# Patient Record
Sex: Male | Born: 1935 | Race: White | Hispanic: No | Marital: Single | Smoking: Former smoker
Health system: Southern US, Community
[De-identification: ages and names within clinical notes are randomized; demographics above are authoritative.]

## PROBLEM LIST (undated history)

## (undated) DIAGNOSIS — E063 Autoimmune thyroiditis: Secondary | ICD-10-CM

## (undated) DIAGNOSIS — D72829 Elevated white blood cell count, unspecified: Secondary | ICD-10-CM

## (undated) DIAGNOSIS — R195 Other fecal abnormalities: Secondary | ICD-10-CM

## (undated) DIAGNOSIS — J449 Chronic obstructive pulmonary disease, unspecified: Secondary | ICD-10-CM

## (undated) DIAGNOSIS — E119 Type 2 diabetes mellitus without complications: Secondary | ICD-10-CM

## (undated) DIAGNOSIS — R0781 Pleurodynia: Secondary | ICD-10-CM

## (undated) DIAGNOSIS — N400 Enlarged prostate without lower urinary tract symptoms: Secondary | ICD-10-CM

## (undated) DIAGNOSIS — K219 Gastro-esophageal reflux disease without esophagitis: Secondary | ICD-10-CM

## (undated) DIAGNOSIS — I4891 Unspecified atrial fibrillation: Secondary | ICD-10-CM

## (undated) DIAGNOSIS — Z87442 Personal history of urinary calculi: Secondary | ICD-10-CM

## (undated) DIAGNOSIS — I509 Heart failure, unspecified: Secondary | ICD-10-CM

## (undated) DIAGNOSIS — D649 Anemia, unspecified: Secondary | ICD-10-CM

## (undated) DIAGNOSIS — I2510 Atherosclerotic heart disease of native coronary artery without angina pectoris: Secondary | ICD-10-CM

## (undated) DIAGNOSIS — F32A Depression, unspecified: Secondary | ICD-10-CM

## (undated) DIAGNOSIS — C159 Malignant neoplasm of esophagus, unspecified: Secondary | ICD-10-CM

## (undated) DIAGNOSIS — R140 Abdominal distension (gaseous): Secondary | ICD-10-CM

## (undated) DIAGNOSIS — I639 Cerebral infarction, unspecified: Secondary | ICD-10-CM

## (undated) DIAGNOSIS — R51 Headache: Secondary | ICD-10-CM

## (undated) DIAGNOSIS — J42 Unspecified chronic bronchitis: Secondary | ICD-10-CM

## (undated) DIAGNOSIS — E079 Disorder of thyroid, unspecified: Secondary | ICD-10-CM

## (undated) DIAGNOSIS — J189 Pneumonia, unspecified organism: Secondary | ICD-10-CM

## (undated) DIAGNOSIS — R519 Headache, unspecified: Secondary | ICD-10-CM

## (undated) DIAGNOSIS — E785 Hyperlipidemia, unspecified: Secondary | ICD-10-CM

## (undated) DIAGNOSIS — I10 Essential (primary) hypertension: Secondary | ICD-10-CM

## (undated) DIAGNOSIS — K635 Polyp of colon: Secondary | ICD-10-CM

## (undated) DIAGNOSIS — R480 Dyslexia and alexia: Secondary | ICD-10-CM

## (undated) DIAGNOSIS — T7840XA Allergy, unspecified, initial encounter: Secondary | ICD-10-CM

## (undated) DIAGNOSIS — F329 Major depressive disorder, single episode, unspecified: Secondary | ICD-10-CM

## (undated) DIAGNOSIS — IMO0001 Reserved for inherently not codable concepts without codable children: Secondary | ICD-10-CM

## (undated) DIAGNOSIS — N189 Chronic kidney disease, unspecified: Secondary | ICD-10-CM

## (undated) HISTORY — PX: COLONOSCOPY: SHX174

## (undated) HISTORY — DX: Atherosclerotic heart disease of native coronary artery without angina pectoris: I25.10

## (undated) HISTORY — DX: Polyp of colon: K63.5

## (undated) HISTORY — PX: EYE SURGERY: SHX253

## (undated) HISTORY — DX: Personal history of urinary calculi: Z87.442

## (undated) HISTORY — DX: Disorder of thyroid, unspecified: E07.9

## (undated) HISTORY — PX: SPINE SURGERY: SHX786

## (undated) HISTORY — DX: Essential (primary) hypertension: I10

## (undated) HISTORY — DX: Heart failure, unspecified: I50.9

## (undated) HISTORY — DX: Hyperlipidemia, unspecified: E78.5

## (undated) HISTORY — DX: Depression, unspecified: F32.A

## (undated) HISTORY — DX: Unspecified atrial fibrillation: I48.91

## (undated) HISTORY — DX: Major depressive disorder, single episode, unspecified: F32.9

## (undated) HISTORY — DX: Type 2 diabetes mellitus without complications: E11.9

## (undated) HISTORY — DX: Gastro-esophageal reflux disease without esophagitis: K21.9

## (undated) HISTORY — DX: Unspecified chronic bronchitis: J42

## (undated) HISTORY — DX: Elevated white blood cell count, unspecified: D72.829

## (undated) HISTORY — DX: Pleurodynia: R07.81

## (undated) HISTORY — DX: Allergy, unspecified, initial encounter: T78.40XA

## (undated) HISTORY — DX: Autoimmune thyroiditis: E06.3

## (undated) HISTORY — PX: TONSILLECTOMY: SUR1361

## (undated) HISTORY — DX: Chronic obstructive pulmonary disease, unspecified: J44.9

## (undated) HISTORY — DX: Chronic kidney disease, unspecified: N18.9

## (undated) HISTORY — DX: Malignant neoplasm of esophagus, unspecified: C15.9

---

## 2002-02-24 ENCOUNTER — Ambulatory Visit (HOSPITAL_COMMUNITY): Admission: RE | Admit: 2002-02-24 | Discharge: 2002-02-24 | Payer: Self-pay | Admitting: Gastroenterology

## 2003-11-29 ENCOUNTER — Ambulatory Visit (HOSPITAL_COMMUNITY): Admission: RE | Admit: 2003-11-29 | Discharge: 2003-11-29 | Payer: Self-pay | Admitting: Gastroenterology

## 2004-04-07 HISTORY — PX: ESOPHAGUS SURGERY: SHX626

## 2004-06-12 ENCOUNTER — Ambulatory Visit: Payer: Self-pay | Admitting: Internal Medicine

## 2004-06-19 ENCOUNTER — Ambulatory Visit: Payer: Self-pay | Admitting: Internal Medicine

## 2004-09-04 ENCOUNTER — Ambulatory Visit: Payer: Self-pay | Admitting: Internal Medicine

## 2004-10-09 ENCOUNTER — Ambulatory Visit (HOSPITAL_COMMUNITY): Admission: RE | Admit: 2004-10-09 | Discharge: 2004-10-09 | Payer: Self-pay | Admitting: Gastroenterology

## 2004-10-15 ENCOUNTER — Ambulatory Visit (HOSPITAL_COMMUNITY): Admission: RE | Admit: 2004-10-15 | Discharge: 2004-10-15 | Payer: Self-pay | Admitting: Gastroenterology

## 2004-10-16 ENCOUNTER — Ambulatory Visit: Payer: Self-pay | Admitting: Internal Medicine

## 2004-11-01 ENCOUNTER — Ambulatory Visit (HOSPITAL_COMMUNITY): Admission: RE | Admit: 2004-11-01 | Discharge: 2004-11-01 | Payer: Self-pay | Admitting: Gastroenterology

## 2004-11-06 ENCOUNTER — Ambulatory Visit (HOSPITAL_COMMUNITY): Admission: RE | Admit: 2004-11-06 | Discharge: 2004-11-06 | Payer: Self-pay | Admitting: Gastroenterology

## 2004-11-08 ENCOUNTER — Ambulatory Visit (HOSPITAL_COMMUNITY): Admission: RE | Admit: 2004-11-08 | Discharge: 2004-11-08 | Payer: Self-pay | Admitting: Gastroenterology

## 2004-11-15 ENCOUNTER — Ambulatory Visit: Payer: Self-pay | Admitting: Internal Medicine

## 2005-03-11 ENCOUNTER — Ambulatory Visit: Payer: Self-pay | Admitting: Internal Medicine

## 2005-03-14 ENCOUNTER — Ambulatory Visit: Payer: Self-pay | Admitting: Internal Medicine

## 2005-07-09 ENCOUNTER — Ambulatory Visit: Payer: Self-pay | Admitting: Internal Medicine

## 2005-09-12 ENCOUNTER — Ambulatory Visit: Payer: Self-pay | Admitting: Internal Medicine

## 2005-10-02 ENCOUNTER — Ambulatory Visit (HOSPITAL_BASED_OUTPATIENT_CLINIC_OR_DEPARTMENT_OTHER): Admission: RE | Admit: 2005-10-02 | Discharge: 2005-10-02 | Payer: Self-pay | Admitting: Internal Medicine

## 2005-10-05 ENCOUNTER — Ambulatory Visit: Payer: Self-pay | Admitting: Internal Medicine

## 2005-10-21 ENCOUNTER — Ambulatory Visit: Payer: Self-pay | Admitting: Internal Medicine

## 2005-11-10 ENCOUNTER — Ambulatory Visit: Payer: Self-pay | Admitting: Internal Medicine

## 2005-12-02 ENCOUNTER — Ambulatory Visit: Payer: Self-pay | Admitting: Internal Medicine

## 2006-03-23 ENCOUNTER — Ambulatory Visit: Payer: Self-pay | Admitting: Internal Medicine

## 2006-04-02 ENCOUNTER — Ambulatory Visit: Payer: Self-pay | Admitting: Internal Medicine

## 2006-07-15 ENCOUNTER — Ambulatory Visit: Payer: Self-pay | Admitting: Internal Medicine

## 2007-04-08 HISTORY — PX: CARDIAC CATHETERIZATION: SHX172

## 2013-07-05 LAB — PULMONARY FUNCTION TEST

## 2014-08-11 ENCOUNTER — Telehealth: Payer: Self-pay | Admitting: *Deleted

## 2014-08-11 ENCOUNTER — Encounter: Payer: Self-pay | Admitting: *Deleted

## 2014-08-11 NOTE — Telephone Encounter (Signed)
Pre-Visit Call completed with patient and chart updated.   Pre-Visit Info documented in Specialty Comments under [employer].    

## 2014-08-14 ENCOUNTER — Encounter: Payer: Self-pay | Admitting: Physician Assistant

## 2014-08-14 ENCOUNTER — Ambulatory Visit (INDEPENDENT_AMBULATORY_CARE_PROVIDER_SITE_OTHER): Payer: Medicare Other | Admitting: Physician Assistant

## 2014-08-14 VITALS — BP 130/60 | HR 86 | Temp 98.0°F | Ht 71.0 in | Wt 219.0 lb

## 2014-08-14 DIAGNOSIS — Z8679 Personal history of other diseases of the circulatory system: Secondary | ICD-10-CM

## 2014-08-14 DIAGNOSIS — I209 Angina pectoris, unspecified: Secondary | ICD-10-CM | POA: Diagnosis not present

## 2014-08-14 DIAGNOSIS — J449 Chronic obstructive pulmonary disease, unspecified: Secondary | ICD-10-CM | POA: Diagnosis not present

## 2014-08-14 DIAGNOSIS — E039 Hypothyroidism, unspecified: Secondary | ICD-10-CM

## 2014-08-14 DIAGNOSIS — F331 Major depressive disorder, recurrent, moderate: Secondary | ICD-10-CM

## 2014-08-14 DIAGNOSIS — E119 Type 2 diabetes mellitus without complications: Secondary | ICD-10-CM | POA: Diagnosis not present

## 2014-08-14 MED ORDER — NYSTATIN 100000 UNIT/GM EX OINT: 1.0000 "application " | TOPICAL_OINTMENT | Freq: Two times a day (BID) | CUTANEOUS | Status: DC

## 2014-08-14 MED ORDER — DULOXETINE HCL 60 MG PO CPEP
60.0000 mg | ORAL_CAPSULE | Freq: Every day | ORAL | Status: AC
Start: 2014-08-14 — End: ?

## 2014-08-14 NOTE — Progress Notes (Signed)
Pre visit review using our clinic review tool, if applicable. No additional management support is needed unless otherwise documented below in the visit note. 

## 2014-08-14 NOTE — Progress Notes (Signed)
Patient presents to clinic today to establish care.  Acute Concerns: No acute concerns at today's visit. Is in need of referral to specialists after moving from [place].   Chronic Issues: COPD -- Previously followed by Pulmonology. New referral needed. Patient states he recently got over an exacerbation.  Denies SOB, fever, chest pain or productive cough at present. Is taking medications as directed.    CHF/Atrial Fibrillation -- Previously followed by Cardiology. New referral needed per patient.  Endorses well controlled with multi-drug regimen. Patient denies chest pain, palpitations, lightheadedness, dizziness, vision changes or frequent headaches.  Denies swelling, PND or orthopnea at present.  Is on Eliquis for anticoagulation.  Is on Ranexa for chronic angina.  Hypothyroidism -- Endorses well controlled with levothyroxine daily. Needs referral to Endo for Hypothyroidism and DM II.  Diabetes Melllitus -- Endorses well controlled with Lantus 60 mg nightly and Novolog following his sliding scale.  Endorses good urinary output.  Has some mild neuropathy controlled with gabapentin.  Denies histtry of retinopathy. CBGs averaging 110-140 presently.  Is requesting Endo referral.  Major Depressive Disorder -- chronic.  Well controlled with Cymbalta and Wellbutrin.  Denies SI/HI.  Denies anxiety.  Wishes to see counseling services here. Needs refill of Cymbalta.  Past Medical History  Diagnosis Date  . COPD (chronic obstructive pulmonary disease)   . CHF (congestive heart failure)   . Thyroid disease   . Chronic bronchitis   . Atrial fibrillation   . CKD (chronic kidney disease)   . Diabetes mellitus without complication   . Allergy   . Cancer     esophogeal   . CKD (chronic kidney disease)   . Depression     Past Surgical History  Procedure Laterality Date  . Esophagus surgery  2006  . Spine surgery      bone spurs  . Cardiac catheterization  2009    two stents placed  .  Tonsillectomy      Current Outpatient Prescriptions on File Prior to Visit  Medication Sig Dispense Refill  . amiodarone (PACERONE) 200 MG tablet Take 200 mg by mouth 2 (two) times daily.    Marland Kitchen amoxicillin-clavulanate (AUGMENTIN) 875-125 MG per tablet Take 1 tablet by mouth 2 (two) times daily.    Marland Kitchen apixaban (ELIQUIS) 5 MG TABS tablet Take 5 mg by mouth 2 (two) times daily.    Marland Kitchen atorvastatin (LIPITOR) 20 MG tablet Take 20 mg by mouth daily.    Marland Kitchen buPROPion (WELLBUTRIN XL) 300 MG 24 hr tablet Take 300 mg by mouth daily.    Marland Kitchen diltiazem (DILACOR XR) 240 MG 24 hr capsule Take 240 mg by mouth daily.    Marland Kitchen doxercalciferol (HECTOROL) 2.5 MCG capsule Take 2.5 mcg by mouth daily.    . Ergocalciferol (VITAMIN D2 PO) Take 50,000 Units by mouth every 30 (thirty) days.    . Fluticasone Furoate-Vilanterol (BREO ELLIPTA IN) Inhale 1 puff into the lungs.    . furosemide (LASIX) 40 MG tablet Take 40-80 mg by mouth daily.    Marland Kitchen gabapentin (NEURONTIN) 300 MG capsule Take 600 mg by mouth 6 (six) times daily.    . insulin aspart (NOVOLOG) 100 UNIT/ML injection Inject 8-28 Units into the skin 3 (three) times daily before meals.    . insulin glargine (LANTUS) 100 UNIT/ML injection Inject 60 Units into the skin at bedtime.    Marland Kitchen ipratropium-albuterol (DUONEB) 0.5-2.5 (3) MG/3ML SOLN Take 3 mLs by nebulization.    Marland Kitchen levothyroxine (SYNTHROID, LEVOTHROID) 112 MCG tablet  Take 112 mcg by mouth daily before breakfast.    . montelukast (SINGULAIR) 10 MG tablet Take 10 mg by mouth at bedtime.    . nebivolol (BYSTOLIC) 10 MG tablet Take 10 mg by mouth daily.    Marland Kitchen olopatadine (PATANOL) 0.1 % ophthalmic solution Place 1 drop into the left eye 2 (two) times daily.    . ranolazine (RANEXA) 500 MG 12 hr tablet Take 500 mg by mouth 2 (two) times daily.    . roflumilast (DALIRESP) 500 MCG TABS tablet Take 500 mcg by mouth daily.    Marland Kitchen spironolactone (ALDACTONE) 25 MG tablet Take 25 mg by mouth daily.    Marland Kitchen tiotropium (SPIRIVA) 18 MCG  inhalation capsule Place 18 mcg into inhaler and inhale daily.     No current facility-administered medications on file prior to visit.    Allergies  Allergen Reactions  . Dust Mite Extract Other (See Comments)    Runny nose   . Pollen Extract Other (See Comments)    Runny nose    No family history on file.  History   Social History  . Marital Status: Single    Spouse Name: N/A  . Number of Children: N/A  . Years of Education: N/A   Occupational History  . Not on file.   Social History Main Topics  . Smoking status: Former Research scientist (life sciences)  . Smokeless tobacco: Not on file  . Alcohol Use: No  . Drug Use: Not on file  . Sexual Activity: Not on file   Other Topics Concern  . Not on file   Social History Narrative   Review of Systems  Constitutional: Negative for fever and malaise/fatigue.  Eyes: Negative for blurred vision and double vision.  Respiratory: Negative for cough and shortness of breath.   Cardiovascular: Negative for chest pain and palpitations.  Genitourinary: Negative for dysuria, urgency, frequency, hematuria and flank pain.  Musculoskeletal: Negative for myalgias.  Neurological: Negative for dizziness and loss of consciousness.  Endo/Heme/Allergies: Negative for environmental allergies.  Psychiatric/Behavioral: Positive for depression. Negative for suicidal ideas, hallucinations and substance abuse. The patient is not nervous/anxious and does not have insomnia.    BP 130/60 mmHg  Pulse 86  Temp(Src) 98 F (36.7 C)  Ht 5\' 11"  (1.803 m)  Wt 219 lb (99.338 kg)  BMI 30.56 kg/m2  SpO2 97%  Physical Exam  Constitutional: He is oriented to person, place, and time and well-developed, well-nourished, and in no distress.  HENT:  Head: Normocephalic and atraumatic.  Eyes: Conjunctivae are normal.  Cardiovascular: Normal rate, regular rhythm, normal heart sounds and intact distal pulses.   Pulmonary/Chest: Effort normal and breath sounds normal. No respiratory  distress. He has no wheezes. He has no rales. He exhibits no tenderness.  Neurological: He is alert and oriented to person, place, and time.  Skin: Skin is warm and dry. No rash noted.  Psychiatric: Affect normal.  Vitals reviewed.  Assessment/Plan: Thyroid activity decreased Referral placed to Endocrinology per patient request.  Asymptomatic at present.  Continue current regimen.   Major depressive disorder, recurrent episode, moderate Will take over medication management.  Patient stable.  Will continue Cymbalta and Wellbutrin. Handout given on [facility] counselors.  Patient will call to schedule appointment with Karna Christmas or Almyra Free.   History of coronary artery disease Asymptomatic presently. Referral placed to Cardiology for follow-up.  Continue current medication regimen.   Diabetes mellitus type II, controlled CBGs acceptable.  Referral placed to Endocrinology per patient request.  Continue current insulin  regimen.  Minimize carbs.   COLD (chronic obstructive lung disease) Stable.  Referral placed to Pulmonology for follow-up and repeat PFTS.

## 2014-08-14 NOTE — Patient Instructions (Signed)
I have placed your referrals. You will be contacted for appointments. If at any time you are running low on medications before you see specialists, please call me so I can take care of it for you.  Please call the number given so you can set up care with our counselors.  I will take over your depression medications.  I would like to see you in 3 months.

## 2014-08-15 DIAGNOSIS — F331 Major depressive disorder, recurrent, moderate: Secondary | ICD-10-CM | POA: Insufficient documentation

## 2014-08-15 DIAGNOSIS — E063 Autoimmune thyroiditis: Secondary | ICD-10-CM | POA: Insufficient documentation

## 2014-08-15 DIAGNOSIS — E119 Type 2 diabetes mellitus without complications: Secondary | ICD-10-CM | POA: Insufficient documentation

## 2014-08-15 DIAGNOSIS — J449 Chronic obstructive pulmonary disease, unspecified: Secondary | ICD-10-CM | POA: Insufficient documentation

## 2014-08-15 DIAGNOSIS — Z8679 Personal history of other diseases of the circulatory system: Secondary | ICD-10-CM | POA: Insufficient documentation

## 2014-08-15 DIAGNOSIS — I209 Angina pectoris, unspecified: Secondary | ICD-10-CM | POA: Insufficient documentation

## 2014-08-15 NOTE — Assessment & Plan Note (Signed)
Asymptomatic presently. Referral placed to Cardiology for follow-up.  Continue current medication regimen.

## 2014-08-15 NOTE — Assessment & Plan Note (Signed)
Will take over medication management.  Patient stable.  Will continue Cymbalta and Wellbutrin. Handout given on [facility] counselors.  Patient will call to schedule appointment with Karna Christmas or Almyra Free.

## 2014-08-15 NOTE — Assessment & Plan Note (Signed)
Stable.  Referral placed to Pulmonology for follow-up and repeat PFTS.

## 2014-08-15 NOTE — Assessment & Plan Note (Signed)
CBGs acceptable.  Referral placed to Endocrinology per patient request.  Continue current insulin regimen.  Minimize carbs.

## 2014-08-15 NOTE — Assessment & Plan Note (Signed)
Referral placed to Endocrinology per patient request.  Asymptomatic at present.  Continue current regimen.

## 2014-08-16 ENCOUNTER — Ambulatory Visit (INDEPENDENT_AMBULATORY_CARE_PROVIDER_SITE_OTHER): Payer: Medicare Other | Admitting: Cardiology

## 2014-08-16 ENCOUNTER — Encounter: Payer: Self-pay | Admitting: Cardiology

## 2014-08-16 VITALS — BP 120/60 | HR 64 | Ht 71.0 in | Wt 221.0 lb

## 2014-08-16 DIAGNOSIS — R011 Cardiac murmur, unspecified: Secondary | ICD-10-CM | POA: Diagnosis not present

## 2014-08-16 DIAGNOSIS — I209 Angina pectoris, unspecified: Secondary | ICD-10-CM

## 2014-08-16 DIAGNOSIS — E785 Hyperlipidemia, unspecified: Secondary | ICD-10-CM

## 2014-08-16 DIAGNOSIS — I480 Paroxysmal atrial fibrillation: Secondary | ICD-10-CM

## 2014-08-16 DIAGNOSIS — I10 Essential (primary) hypertension: Secondary | ICD-10-CM | POA: Diagnosis not present

## 2014-08-16 DIAGNOSIS — Z8679 Personal history of other diseases of the circulatory system: Secondary | ICD-10-CM | POA: Diagnosis not present

## 2014-08-16 NOTE — Patient Instructions (Signed)
Medication Instructions:   Your physician recommends that you continue on your current medications as directed. Please refer to the Current Medication list given to you today.   Labwork:  SCHEDULE A LAB APPOINTMENT PRIOR TO YOUR 6 MONTH FOLLOW-UP APPOINTMENT WITH DR NELSON TO CHECK---LIPIDS, CMET, TSH, AND HEMOGLOBIN A1C----PLEASE COME FASTING TO THIS APPOINTMENT.     Testing/Procedures:  Your physician has requested that you have an echocardiogram. Echocardiography is a painless test that uses sound waves to create images of your heart. It provides your doctor with information about the size and shape of your heart and how well your heart's chambers and valves are working. This procedure takes approximately one hour. There are no restrictions for this procedure.    Follow-Up:  Your physician wants you to follow-up in: [place] will receive a reminder letter in the mail two months in advance. If you don't receive a letter, please call our office to schedule the follow-up appointment.

## 2014-08-16 NOTE — Progress Notes (Signed)
Patient ID: Ricky Baker, male   DOB: July 12, 1935, 79 y.o.   MRN: [id number]      Cardiology Office Note  Date:  08/16/2014   ID:  Ricky Baker, DOB [birth year], MRN [id number]  PCP:  Leeanne Rio, PA-C  Cardiologist:   Dorothy Spark, MD   Chief complain: DOE, establishing care   History of Present Illness:  Ricky Baker is a 79 y.o. male with prior medical history of coronary artery disease status post placement of 2 stents in 2013, COPD on home 2 at 2 liters, congestive heart failure, hypertension, IDDM, hyperlipidemia who has just moved to [place] couple weeks ago. The patient was followed in [facility] by Dr. Lyndel Safe. He states that prior to his stent placement he experience worsening dyspnea on exertion however never experienced chest pain. He noticed mild improvement of dyspnea after stent stent placement as he is on home O2 oxygen and has dyspnea almost all the time. The patient is also experiencing diabetic neuropathy and pain in his legs. He also has history of paroxysmal atrial fibrillation currently on Eliquis 5 mg orally twice a day. He has history of esophageal cancer status post partial esophagectomy, but no radiation. He is very compliant with his medications. He takes 40-80 mg of Lasix daily based on his weight as well as as well as spironolactone. His palpitations or syncope. No orthopnea or proximal nocturnal dyspnea.  Past Medical History  Diagnosis Date  . COPD (chronic obstructive pulmonary disease)   . CHF (congestive heart failure)   . Thyroid disease   . Chronic bronchitis   . Atrial fibrillation   . CKD (chronic kidney disease)   . Diabetes mellitus without complication   . Allergy   . Cancer     esophogeal   . CKD (chronic kidney disease)   . Depression    Past Surgical History  Procedure Laterality Date  . Esophagus surgery  2006  . Spine surgery      bone spurs  . Cardiac catheterization  2009    two stents placed  .  Tonsillectomy     Current Outpatient Prescriptions  Medication Sig Dispense Refill  . amiodarone (PACERONE) 200 MG tablet Take 200 mg by mouth 2 (two) times daily.    Marland Kitchen amoxicillin-clavulanate (AUGMENTIN) 875-125 MG per tablet Take 1 tablet by mouth 2 (two) times daily.    Marland Kitchen apixaban (ELIQUIS) 5 MG TABS tablet Take 5 mg by mouth 2 (two) times daily.    Marland Kitchen atorvastatin (LIPITOR) 20 MG tablet Take 20 mg by mouth daily.    Marland Kitchen buPROPion (WELLBUTRIN XL) 300 MG 24 hr tablet Take 300 mg by mouth daily.    Marland Kitchen diltiazem (DILACOR XR) 240 MG 24 hr capsule Take 240 mg by mouth daily.    Marland Kitchen doxercalciferol (HECTOROL) 2.5 MCG capsule Take 2.5 mcg by mouth daily.    . DULoxetine (CYMBALTA) 60 MG capsule Take 1 capsule (60 mg total) by mouth daily. 90 capsule 1  . Ergocalciferol (VITAMIN D2 PO) Take 50,000 Units by mouth every 30 (thirty) days.    . Fluticasone Furoate-Vilanterol (BREO ELLIPTA IN) Inhale 1 puff into the lungs.    . furosemide (LASIX) 40 MG tablet Take 40-80 mg by mouth daily.    Marland Kitchen gabapentin (NEURONTIN) 300 MG capsule Take 600 mg by mouth 6 (six) times daily.    . insulin aspart (NOVOLOG) 100 UNIT/ML injection Inject 8-28 Units into the skin 3 (three) times daily before meals.    Marland Kitchen  insulin glargine (LANTUS) 100 UNIT/ML injection Inject 60 Units into the skin at bedtime.    Marland Kitchen ipratropium-albuterol (DUONEB) 0.5-2.5 (3) MG/3ML SOLN Take 3 mLs by nebulization.    Marland Kitchen levothyroxine (SYNTHROID, LEVOTHROID) 112 MCG tablet Take 112 mcg by mouth daily before breakfast.    . montelukast (SINGULAIR) 10 MG tablet Take 10 mg by mouth at bedtime.    . nebivolol (BYSTOLIC) 10 MG tablet Take 10 mg by mouth daily.    Marland Kitchen nystatin ointment (MYCOSTATIN) Apply 1 application topically 2 (two) times daily. 30 g 0  . olopatadine (PATANOL) 0.1 % ophthalmic solution Place 1 drop into the left eye 2 (two) times daily as needed.     . ranolazine (RANEXA) 500 MG 12 hr tablet Take 500 mg by mouth 2 (two) times daily.    .  roflumilast (DALIRESP) 500 MCG TABS tablet Take 500 mcg by mouth daily.    Marland Kitchen spironolactone (ALDACTONE) 25 MG tablet Take 25 mg by mouth daily.    Marland Kitchen tiotropium (SPIRIVA) 18 MCG inhalation capsule Place 18 mcg into inhaler and inhale daily.     No current facility-administered medications for this visit.   Allergies:   Dust mite extract and Pollen extract   Social History:  The patient  reports that he has quit smoking. He does not have any smokeless tobacco history on file. He reports that he does not drink alcohol.   Family History:  No FH of CAD or CHF.    ROS:  Please see the history of present illness.   Otherwise, review of systems are positive for none.   All other systems are reviewed and negative.    PHYSICAL EXAM: VS:  BP 120/60 mmHg  Pulse 64  Ht 5\' 11"  (1.803 m)  Wt 221 lb (100.245 kg)  BMI 30.84 kg/m2 , BMI Body mass index is 30.84 kg/(m^2). GEN: Well nourished, well developed, in no acute distress HEENT: normal Neck: no JVD, carotid bruits, or masses Cardiac: RRR; 3/6 holosystolic murmur, rubs, or gallops, no edema, 1+ PD, peripheral pulses Respiratory:  clear to auscultation bilaterally, normal work of breathing GI: soft, nontender, nondistended, + BS MS: no deformity or atrophy Skin: warm and dry, no rash Neuro:  Strength and sensation are intact Psych: euthymic mood, full affect   EKG:  EKG is ordered today. The ekg ordered today demonstrates SR, 1.AVB, otherwise normal  Recent Labs: No results found for requested labs within last 365 days.    Lipid Panel No results found for: CHOL, TRIG, HDL, CHOLHDL, VLDL, LDLCALC, LDLDIRECT    Wt Readings from Last 3 Encounters:  08/16/14 221 lb (100.245 kg)  08/14/14 219 lb (99.338 kg)      ASSESSMENT AND PLAN:  1. CAD, s/p 2 stents placement - stable DOE, ECG shows no acute changes, no ischemic workup at this point  2. CHF - unknown LVEF and diastolic function, we will order echocardiogram, the patient is  euvolemic, we will continue the same regimen of lasix 40-80 mg po daily and spironolactone 25 mg po daily.   3. Systolic murmur - we will order echocardiogram  4. Hypertension - controlled, continue the same regimen  5. HLP - continue atorvastatin 20 mg po daily, we will check labs at the next visit.    6. Parox a-fib - on apixaban, we will continue, currently in SR on amiodarone, diltiazem, nebivolol.   Current medicines are reviewed at length with the patient today.  The patient does not have concerns regarding  medicines.  The following changes have been made:  no change  Labs/ tests ordered today include: TTE   Disposition:   FU with /me in 6 month with CMP, lipids, TSH, HbA1c.  Signed, Dorothy Spark, MD  08/16/2014 2:52 PM    [facility] [place], [place], [place]  [address] Phone: [phone]; Fax: [phone]

## 2014-08-21 ENCOUNTER — Telehealth: Payer: Self-pay | Admitting: Physician Assistant

## 2014-08-21 NOTE — Telephone Encounter (Signed)
Caller: Silvano Garofano, self Ph#: [phone]  Pt states his sister had a msg on her answering machine with appt 5/18 at 8:30am with psychology. They left contact ph# of [phone]. I informed pt I do not see referral in for psychology or appt thru our system. Please f/u with pt and notify him if a referral was sent for him.

## 2014-08-21 NOTE — Telephone Encounter (Signed)
Informed the patient of PCP instructions.

## 2014-08-21 NOTE — Telephone Encounter (Signed)
Patient was given a handout on our counseling services with a number to call and set up own appointment.  We do not do referrals to psychology due to [place]. He would be the one that set up appointment.

## 2014-08-24 ENCOUNTER — Other Ambulatory Visit: Payer: Self-pay

## 2014-08-24 ENCOUNTER — Ambulatory Visit (HOSPITAL_COMMUNITY): Payer: Medicare Other | Attending: Cardiology

## 2014-08-24 DIAGNOSIS — F329 Major depressive disorder, single episode, unspecified: Secondary | ICD-10-CM | POA: Diagnosis not present

## 2014-08-24 DIAGNOSIS — I10 Essential (primary) hypertension: Secondary | ICD-10-CM | POA: Insufficient documentation

## 2014-08-24 DIAGNOSIS — Z87891 Personal history of nicotine dependence: Secondary | ICD-10-CM | POA: Insufficient documentation

## 2014-08-24 DIAGNOSIS — E119 Type 2 diabetes mellitus without complications: Secondary | ICD-10-CM | POA: Insufficient documentation

## 2014-08-24 DIAGNOSIS — E785 Hyperlipidemia, unspecified: Secondary | ICD-10-CM

## 2014-08-24 DIAGNOSIS — R011 Cardiac murmur, unspecified: Secondary | ICD-10-CM | POA: Diagnosis not present

## 2014-08-24 DIAGNOSIS — R209 Unspecified disturbances of skin sensation: Secondary | ICD-10-CM | POA: Insufficient documentation

## 2014-08-24 DIAGNOSIS — I480 Paroxysmal atrial fibrillation: Secondary | ICD-10-CM

## 2014-08-24 DIAGNOSIS — Z8679 Personal history of other diseases of the circulatory system: Secondary | ICD-10-CM | POA: Diagnosis not present

## 2014-08-24 DIAGNOSIS — Z794 Long term (current) use of insulin: Secondary | ICD-10-CM | POA: Insufficient documentation

## 2014-08-24 DIAGNOSIS — I209 Angina pectoris, unspecified: Secondary | ICD-10-CM | POA: Diagnosis not present

## 2014-08-24 MED ORDER — PERFLUTREN LIPID MICROSPHERE
2.0000 mL | Freq: Once | INTRAVENOUS | Status: AC
  Administered 2014-08-24: 2 mL via INTRAVENOUS

## 2014-08-25 ENCOUNTER — Other Ambulatory Visit: Payer: Self-pay | Admitting: *Deleted

## 2014-08-25 DIAGNOSIS — I350 Nonrheumatic aortic (valve) stenosis: Secondary | ICD-10-CM

## 2014-08-30 ENCOUNTER — Encounter: Payer: Self-pay | Admitting: Endocrinology

## 2014-08-30 ENCOUNTER — Ambulatory Visit (INDEPENDENT_AMBULATORY_CARE_PROVIDER_SITE_OTHER): Payer: Medicare Other | Admitting: Endocrinology

## 2014-08-30 VITALS — BP 134/70 | HR 68 | Temp 97.8°F | Resp 16 | Ht 71.0 in | Wt 221.4 lb

## 2014-08-30 DIAGNOSIS — N189 Chronic kidney disease, unspecified: Secondary | ICD-10-CM

## 2014-08-30 DIAGNOSIS — E038 Other specified hypothyroidism: Secondary | ICD-10-CM | POA: Diagnosis not present

## 2014-08-30 DIAGNOSIS — E1165 Type 2 diabetes mellitus with hyperglycemia: Secondary | ICD-10-CM | POA: Diagnosis not present

## 2014-08-30 DIAGNOSIS — G629 Polyneuropathy, unspecified: Secondary | ICD-10-CM

## 2014-08-30 DIAGNOSIS — E291 Testicular hypofunction: Secondary | ICD-10-CM

## 2014-08-30 DIAGNOSIS — E1142 Type 2 diabetes mellitus with diabetic polyneuropathy: Secondary | ICD-10-CM | POA: Diagnosis not present

## 2014-08-30 DIAGNOSIS — IMO0002 Reserved for concepts with insufficient information to code with codable children: Secondary | ICD-10-CM

## 2014-08-30 DIAGNOSIS — E063 Autoimmune thyroiditis: Secondary | ICD-10-CM

## 2014-08-30 LAB — CBC WITH DIFFERENTIAL/PLATELET
BASOS PCT: 0.6 % (ref 0.0–3.0)
Basophils Absolute: 0 10*3/uL (ref 0.0–0.1)
EOS PCT: 3.4 % (ref 0.0–5.0)
Eosinophils Absolute: 0.3 10*3/uL (ref 0.0–0.7)
HCT: 34 % — ABNORMAL LOW (ref 39.0–52.0)
Hemoglobin: 11.8 g/dL — ABNORMAL LOW (ref 13.0–17.0)
LYMPHS ABS: 1.5 10*3/uL (ref 0.7–4.0)
LYMPHS PCT: 20.1 % (ref 12.0–46.0)
MCHC: 34.6 g/dL (ref 30.0–36.0)
MCV: 90.2 fl (ref 78.0–100.0)
Monocytes Absolute: 0.5 10*3/uL (ref 0.1–1.0)
Monocytes Relative: 5.9 % (ref 3.0–12.0)
Neutro Abs: 5.4 10*3/uL (ref 1.4–7.7)
Neutrophils Relative %: 70 % (ref 43.0–77.0)
Platelets: 276 10*3/uL (ref 150.0–400.0)
RBC: 3.77 Mil/uL — ABNORMAL LOW (ref 4.22–5.81)
RDW: 13 % (ref 11.5–15.5)
WBC: 7.7 10*3/uL (ref 4.0–10.5)

## 2014-08-30 LAB — LIPID PANEL
Cholesterol: 217 mg/dL — ABNORMAL HIGH (ref 0–200)
HDL: 37.1 mg/dL — ABNORMAL LOW (ref >39.00)
Total CHOL/HDL Ratio: 6
Triglycerides: 412 mg/dL — ABNORMAL HIGH (ref 0.0–149.0)

## 2014-08-30 LAB — COMPREHENSIVE METABOLIC PANEL
ALT: 10 U/L (ref 0–53)
AST: 13 U/L (ref 0–37)
Albumin: 3.6 g/dL (ref 3.5–5.2)
Alkaline Phosphatase: 146 U/L — ABNORMAL HIGH (ref 39–117)
BILIRUBIN TOTAL: 0.3 mg/dL (ref 0.2–1.2)
BUN: 28 mg/dL — ABNORMAL HIGH (ref 6–23)
CO2: 21 meq/L (ref 19–32)
Calcium: 8.9 mg/dL (ref 8.4–10.5)
Chloride: 111 mEq/L (ref 96–112)
Creatinine, Ser: 2.19 mg/dL — ABNORMAL HIGH (ref 0.40–1.50)
GFR: 31.21 mL/min — ABNORMAL LOW (ref >60.00)
Glucose, Bld: 242 mg/dL — ABNORMAL HIGH (ref 70–99)
Potassium: 4 mEq/L (ref 3.5–5.1)
Sodium: 140 mEq/L (ref 135–145)
Total Protein: 6.2 g/dL (ref 6.0–8.3)

## 2014-08-30 LAB — LDL CHOLESTEROL, DIRECT: Direct LDL: 131 mg/dL

## 2014-08-30 LAB — HEMOGLOBIN A1C: Hgb A1c MFr Bld: 9.2 % — ABNORMAL HIGH (ref 4.6–6.5)

## 2014-08-30 LAB — T4, FREE: FREE T4: 1.02 ng/dL (ref 0.60–1.60)

## 2014-08-30 LAB — TSH: TSH: 1.78 u[IU]/mL (ref 0.35–4.50)

## 2014-08-30 NOTE — Patient Instructions (Addendum)
Check blood sugars on waking up .Marland Kitchen 4-5 .Marland Kitchen times a week Also check blood sugars about 2 hours after a meal and do this after different meals by rotation  Recommended blood sugar levels on waking up is 90-130 and about 2 hours after meal is 140-180 Please bring blood sugar monitor to each visit.  NOVOLOG meal time coverage  10 UNITS AT BREAKFAST, 16 at lunch and 10 at supper Take this regardless of sugar before the meal  FOR HIGH SUGARS add NOVOLOG as below:  Sugar 150-199, take extra 2 units; 200-249 take 4 more and over 250 take 6 more

## 2014-08-30 NOTE — Progress Notes (Signed)
Quick Note:  Please let patient know that the A1c is high at 9.2 Cholesterol is excessively high. Confirm that he is taking his 20 mg atorvastatin daily, need to change this to 80 mg daily Thyroid okay His labs will be forwarded to PCP ______

## 2014-08-30 NOTE — Progress Notes (Signed)
Patient ID: Ricky Baker, male   DOB: [birth year], 79 y.o.   MRN: [id number]           Reason for Appointment: Consultation for Type 2 Diabetes  Referring physician: Raiford Noble  History of Present Illness:          Date of diagnosis of type 2 diabetes mellitus :        Background history:  The patient was on oral medications for the first few years of his diagnosis, not clear what control he had at that time After about 10 years because of his problem with esophageal cancer and not being able to eat normal food he was on insulin Subsequently this was continued, partly because of his kidney function not being normal and metformin being discontinued He thinks he has been on Lantus insulin for several years along with Novolog Details of his previous control is not available but apparently it had been dependent on his compliance  Recent history:  He says for the last year he has been quite irregular with his insulin regimen and has not been motivated to take care of his diabetes His last A1c was over 8% about 2 months ago but no records are available. He does not take his Novolog frequently and occasionally will forget his Lantus also  Insulin regimen: He is taking Novolog only on a sliding scale and since he is not checking his blood sugars consistently at meal times he will not take this consistently.  Takes Lantus at night but may take variable doses based on blood sugar at night  INSULIN regimen is described as: Novolog insulin for blood sugars over 130: 8-28 units before meals.  Lantus 60 units at bedtime        Current blood sugar patterns and problems identified:    He is checking his blood sugars very sporadically and appears to be doing them frequently during the night and less during the day  for the lastHe appears to have had low normal or low sugars occasionally with taking extra insulin for high readings and not eating as much  He has only a couple of readings in the  morning fasting and these appear to be consistently high  Overall blood sugars are very erratic but overall the highest when he checks them overnight, once glucose was 524  He will forget his Lantus periodically at night and this may cause a sugars to be higher overnight  He thinks that if he does not have high sugar and he takes 60 units of Lantus he will get a low sugar the next morning  Most of a sugars at bedtime are over 300 now  His overall average blood sugar for the last month his 268 Hypoglycemia:   only his glucose was  50 about 2 hours after evening meal  Oral hypoglycemic drugs the patient is taking are:   none     Compliance with the medical regimen: Poor  Glucose monitoring:  done 1 times a day on an average         Glucometer:  Accu-Chek Blood Glucose readings by time of day and averages from meter download:  PREMEAL Breakfast Lunch Dinner Bedtime  overnight   Glucose range:  186-330   250-300  322-408   125-524   Median:     292   POST-MEAL PC Breakfast PC Lunch PC Dinner  Glucose range: ?    70   50   Median:  Self-care: The diet that the patient has been following is: None.  Eating out at restaurants about 4-5 times a week, usually at fast food places Meal times: Breakfast: 9 AM Lunch: 1 pm Dinner: 6pm  Typical meal intake: Breakfast is cereal              Dietician visit, most recent: A few years ago               Exercise:  none, unable to do much activity because of his multiple medical problems and balance issues   Weight history: He has gradually lost about 40 pounds over the last year and a half   Wt Readings from Last 3 Encounters:  08/30/14 221 lb 6.4 oz (100.426 kg)  08/16/14 221 lb (100.245 kg)  08/14/14 219 lb (99.338 kg)    Glycemic control: Last A1c was 8% or more in March   No results found for: HGBA1C No results found for: GLUF, [place], [place], CREATININE       Medication List       This list is accurate as of: 08/30/14  12:07 PM.  Always use your most recent med list.               amiodarone 200 MG tablet  Commonly known as:  PACERONE  Take 200 mg by mouth 2 (two) times daily.     amoxicillin-clavulanate 875-125 MG per tablet  Commonly known as:  AUGMENTIN  Take 1 tablet by mouth 2 (two) times daily.     apixaban 5 MG Tabs tablet  Commonly known as:  ELIQUIS  Take 5 mg by mouth 2 (two) times daily.     atorvastatin 20 MG tablet  Commonly known as:  LIPITOR  Take 20 mg by mouth daily.     BREO ELLIPTA IN  Inhale 1 puff into the lungs.     buPROPion 300 MG 24 hr tablet  Commonly known as:  WELLBUTRIN XL  Take 300 mg by mouth daily.     diltiazem 240 MG 24 hr capsule  Commonly known as:  DILACOR XR  Take 240 mg by mouth daily.     doxercalciferol 2.5 MCG capsule  Commonly known as:  HECTOROL  Take 2.5 mcg by mouth daily.     DULoxetine 60 MG capsule  Commonly known as:  CYMBALTA  Take 1 capsule (60 mg total) by mouth daily.     furosemide 40 MG tablet  Commonly known as:  LASIX  Take 40-80 mg by mouth daily.     gabapentin 300 MG capsule  Commonly known as:  NEURONTIN  Take 600 mg by mouth 6 (six) times daily.     insulin aspart 100 UNIT/ML injection  Commonly known as:  novoLOG  Inject 8-28 Units into the skin 3 (three) times daily before meals.     insulin glargine 100 UNIT/ML injection  Commonly known as:  LANTUS  Inject 60 Units into the skin at bedtime.     ipratropium-albuterol 0.5-2.5 (3) MG/3ML Soln  Commonly known as:  DUONEB  Take 3 mLs by nebulization.     levothyroxine 112 MCG tablet  Commonly known as:  SYNTHROID, LEVOTHROID  Take 112 mcg by mouth daily before breakfast.     montelukast 10 MG tablet  Commonly known as:  SINGULAIR  Take 10 mg by mouth at bedtime.     nebivolol 10 MG tablet  Commonly known as:  BYSTOLIC  Take 10 mg by mouth daily.  nystatin ointment  Commonly known as:  MYCOSTATIN  Apply 1 application topically 2 (two) times  daily.     olopatadine 0.1 % ophthalmic solution  Commonly known as:  PATANOL  Place 1 drop into the left eye 2 (two) times daily as needed.     ranolazine 500 MG 12 hr tablet  Commonly known as:  RANEXA  Take 500 mg by mouth 2 (two) times daily.     roflumilast 500 MCG Tabs tablet  Commonly known as:  DALIRESP  Take 500 mcg by mouth daily.     spironolactone 25 MG tablet  Commonly known as:  ALDACTONE  Take 25 mg by mouth daily.     testosterone 50 MG/5GM (1%) Gel  Commonly known as:  ANDROGEL  Place 5 g onto the skin daily. Uses 6 pumps daily     tiotropium 18 MCG inhalation capsule  Commonly known as:  SPIRIVA  Place 18 mcg into inhaler and inhale daily.     VITAMIN D2 PO  Take 50,000 Units by mouth every 30 (thirty) days.        Allergies:  Allergies  Allergen Reactions  . Dust Mite Extract Other (See Comments)    Runny nose   . Pollen Extract Other (See Comments)    Runny nose    Past Medical History  Diagnosis Date  . COPD (chronic obstructive pulmonary disease)   . CHF (congestive heart failure)   . Thyroid disease   . Chronic bronchitis   . Atrial fibrillation   . CKD (chronic kidney disease)   . Diabetes mellitus without complication   . Allergy   . Cancer     esophogeal   . CKD (chronic kidney disease)   . Depression   . Hypertension     Past Surgical History  Procedure Laterality Date  . Esophagus surgery  2006  . Spine surgery      bone spurs  . Cardiac catheterization  2009    two stents placed  . Tonsillectomy      Family History  Problem Relation Age of Onset  . COPD Father   . Diabetes Neg Hx     Social History:  reports that he has quit smoking. He does not have any smokeless tobacco history on file. He reports that he does not drink alcohol. His drug history is not on file.    Review of Systems    Lipid history: On Lipitor for long time    No results found for: CHOL, HDL, LDLCALC, LDLDIRECT, TRIG, CHOLHDL          Constitutional: no recent weight gain/loss.  Does complain of fatigue  Eyes: no history of blurred vision.  Most recent eye exam was 5/16 and reportedly normal, he saw an optometrist in [place]  ENT: no nasal congestion, difficulty swallowing  Cardiovascular: no chest pain or tightness on exertion.  No recent leg swelling.  Has been on Lasix for history of CHF History of atrial fibrillation, currently taking amiodarone Hypertension: Present for several years and treated with multiple drugs including diltiazem, Bystolic and Aldactone  Respiratory: no cough but does have significant shortness of breath from his chronic COPD.  Uses oxygen at home along with multiple inhalers  Gastrointestinal: no constipation, diarrhea, nausea or abdominal pain.  He will get severe reflux if he lies down flat  Musculoskeletal: no muscle/joint aches   Urological:   No frequency of urination or excessive Nocturia ED: Has been symptomatic for several years, previously tried  Cialis and Viagra with no benefit.  Currently not sexually active and has decreased libido  Skin: no rash or infections  Neurological: no headaches.  Has long-standing numbness and pains or tingling in feet controlled with gabapentin   Psychiatric:  Some symptoms of depression  Endocrine: No unusual fatigue, cold intolerance  He has a history of thyroid disease for about 52 yrs and taking thyroid supplements, previously followed by endocrinologist.  He has been on the same dose for about 2 years  Hypogonadism: He was treated by a urologist for about 6 years, previously was told to have hypogonadism when he was complaining of feeling lethargic and decreased libido.  Has not taken any AndroGel for about a year now.  May be feeling a little tired now   Anemia: He thinks his last hemoglobin was about 11  CKD: Previously followed by nephrologist  No results found for: TSH, FREET4     No results found for any previous  visit.  Physical Examination:  BP 134/70 mmHg  Pulse 68  Temp(Src) 97.8 F (36.6 C)  Resp 16  Ht 5\' 11"  (1.803 m)  Wt 221 lb 6.4 oz (100.426 kg)  BMI 30.89 kg/m2  SpO2 93%  GENERAL:         Patient has mild generalized obesity, does have abdominal obesity also .   HEENT:         Eye exam shows normal external appearance. Fundus exam shows no retinopathy. Oral exam shows normal mucosa, tongue is relatively dry and coated .  NECK:   There is no lymphadenopathy Thyroid is not enlarged and no nodules felt.  Carotids are normal to palpation and no bruit heard LUNGS:         Chest is symmetrical. Lungs are clear to auscultation.Marland Kitchen   HEART:         Heart sounds:  S1 and S2 are relatively distant. No S3 or S4.  Has 3/6 soft pansystolic murmur over the left precordium    ABDOMEN:   There is no distention present. Liver and spleen are not palpable. No other mass or tenderness present.   NEUROLOGICAL:   Vibration sense is  absent in distal first toes. Ankle and biceps jerks are absent bilaterally.          Pedal pulses absent, monofilament sensation absent except mild sensation present in the left distal toes MUSCULOSKELETAL:  There is no swelling or deformity of the peripheral joints. Spine is normal to inspection.   EXTREMITIES:     There is no edema. No skin lesions present. SKIN:       No rash or lesions of concern.        ASSESSMENT:  Diabetes type 2, uncontrolled   See history of present illness for detailed discussion of his current management, blood sugar patterns and problems identified He does have insulin deficiency and has been on insulin supplementation for quite some time; is only modestly overweight  Most of his poor control is related to noncompliance with all aspects of his care including insulin regimen and glucose monitoring Blood sugars are very erratic because of his not taking his Novolog at meals consistently Also currently only on a correction dose of Novolog without  any mealtime coverage; this may occasionally cause hypoglycemia if he treats a high sugar without adequate carbohydrate intake He does not understand the need for bringing meals with fast acting insulin consistently and also taking a minimum dose regardless of blood sugar Overall he does need considerable  diabetes education including meal planning, currently eating somewhat unbalanced meals especially breakfast which is high carbohydrate BASAL insulin: He is taking relatively high doses but does not always take it consistently as well as does not understand the need to adjust the dose based on fasting readings, he is mostly adjusting based on bedtime dose Most likely he will need less basal insulin when he has better control of his mealtime sugars also  Complications: Mild peripheral neuropathy. May have diabetic nephropathy.  Also has a history of erectile dysfunction  HYPOTHYROIDISM: He has had long-standing hypothyroidism, likely autoimmune and needs follow-up levels of TSH  History of HYPOGONADISM: He has not been on any treatment for the last year and not clear if he is symptomatic. At his age 3 not  necessarily need supplementation unless his levels are very low, will check testosterone level today, also may need to confirm that he has hypogonadotropic hypogonadism and normal prolactin levels  Multiple other medical problems including severe COPD, CAD, history of CHF, atrial fibrillation, depression and history of esophageal cancer  CHRONIC kidney disease: Not clear what his GFR levels are and will also need to be followed by nephrologist as before, he will need to ask PCP to refer him to  local nephrologist  PLAN for diabetes:   He needs to improve his compliance with all aspects of his diabetes care including glucose monitoring and taking insulin consistently  Discussed that he needs to take his Lantus consistently at the same time every evening.  For now will keep him on 60 units but  discussed that if his fasting blood sugars are consistently high will need to be given a higher dose.  Considered switching to [facility] when his current Lantus supply runs out for more consistent and complete 24-hour control  MEALTIME insulin: He will need to be taking coverage for all his meals and discussed the importance and rational for doing this.  We will empirically start him on 10 units of Novolog at breakfast and supper and 16 at lunch which is his main meal  He will also take additional insulin for high blood sugars before meals  He will be scheduled to see the nurse educator in 2 weeks for more detailed education and review  day today management including insulin dosing, monitoring and meal planning.  May also consider separate consultation with dietitian  Discussed eating balanced meals especially with breakfast with some protein  He will try to be more compliant with all his self-care measures including checking his blood sugars and taking insulin on time  He may be a candidate for insulin pump also but considering his age and multiple medical problems does not need aggressive control  Patient Instructions  Check blood sugars on waking up .Marland Kitchen 4-5 .Marland Kitchen times a week Also check blood sugars about 2 hours after a meal and do this after different meals by rotation  Recommended blood sugar levels on waking up is 90-130 and about 2 hours after meal is 140-180 Please bring blood sugar monitor to each visit.  NOVOLOG meal time coverage  10 UNITS AT BREAKFAST, 16 at lunch and 10 at supper Take this regardless of sugar before the meal  FOR HIGH SUGARS add NOVOLOG as below:  Sugar 150-199, take extra 2 units; 200-249 take 4 more and over 250 take 6 more   Counseling time on subjects discussed above is over 50% of today's 60 minute visit  Aquila Delaughter 08/30/2014, 12:07 PM   Note: This office note was prepared  with Estate agent. Any transcriptional errors that  result from this process are unintentional.

## 2014-08-31 ENCOUNTER — Other Ambulatory Visit: Payer: Self-pay | Admitting: *Deleted

## 2014-08-31 MED ORDER — ATORVASTATIN CALCIUM 80 MG PO TABS: 80.0000 mg | ORAL_TABLET | Freq: Every day | ORAL | Status: DC

## 2014-09-05 LAB — SPECIMEN STATUS REPORT

## 2014-09-07 LAB — TESTOSTERONE, FREE, TOTAL, SHBG
Sex Hormone Binding: 48.7 nmol/L (ref 19.3–76.4)
TESTOSTERONE: 90 ng/dL — AB (ref 348–1197)
Testosterone, Free: 1.4 pg/mL — ABNORMAL LOW (ref 6.6–18.1)

## 2014-09-11 ENCOUNTER — Encounter: Payer: Self-pay | Admitting: Physician Assistant

## 2014-09-11 ENCOUNTER — Ambulatory Visit (HOSPITAL_BASED_OUTPATIENT_CLINIC_OR_DEPARTMENT_OTHER)
Admission: RE | Admit: 2014-09-11 | Discharge: 2014-09-11 | Disposition: A | Discharge status: Home / Self Care | Payer: Medicare Other | Source: Ambulatory Visit | Attending: Physician Assistant | Admitting: Physician Assistant

## 2014-09-11 ENCOUNTER — Ambulatory Visit (INDEPENDENT_AMBULATORY_CARE_PROVIDER_SITE_OTHER): Payer: Medicare Other | Admitting: Physician Assistant

## 2014-09-11 VITALS — BP 143/65 | HR 102 | Temp 97.8°F | Ht 71.0 in | Wt 229.4 lb

## 2014-09-11 DIAGNOSIS — M25511 Pain in right shoulder: Secondary | ICD-10-CM | POA: Diagnosis not present

## 2014-09-11 DIAGNOSIS — G8929 Other chronic pain: Secondary | ICD-10-CM | POA: Diagnosis not present

## 2014-09-11 NOTE — Progress Notes (Signed)
Pre visit review using our clinic review tool, if applicable. No additional management support is needed unless otherwise documented below in the visit note. 

## 2014-09-11 NOTE — Assessment & Plan Note (Signed)
Will obtain x-ray to further assess. Salon pas patches encouraged. Tylenol for pain if needed. Supportive measures reviewed.  Will refer to Sports medicine or Ortho based on results.

## 2014-09-11 NOTE — Patient Instructions (Signed)
Please go downstairs for imaging. I will call you with your results. Continue medications as directed. Get a Salon Pas patch to place on the R shoulder as this will be easier to apply than the cream.  Avoid heavy lifting or overexertion. Continue Tylenol if needed for pain. Based on x-ray results, we will set you up with Sports Medicine or orthopedics.

## 2014-09-11 NOTE — Progress Notes (Signed)
Patient presents to clinic today c/o worsening of chronic R shoulder pain.  Patient endorses pain seems to be in different areas of posterior shoulder.  Denies radiation of pain. Endorses pain is now daily but still comes and goes.  Endorses worse with driving.  Denies numbness, tingling or weakness. Denies hx of imaging of R shoulder.  Has been taking tylenol with little improvement..   Past Medical History  Diagnosis Date  . COPD (chronic obstructive pulmonary disease)   . CHF (congestive heart failure)   . Thyroid disease   . Chronic bronchitis   . Atrial fibrillation   . CKD (chronic kidney disease)   . Diabetes mellitus without complication   . Allergy   . Cancer     esophogeal   . CKD (chronic kidney disease)   . Depression   . Hypertension     Current Outpatient Prescriptions on File Prior to Visit  Medication Sig Dispense Refill  . amiodarone (PACERONE) 200 MG tablet Take 200 mg by mouth 2 (two) times daily.    Marland Kitchen amoxicillin-clavulanate (AUGMENTIN) 875-125 MG per tablet Take 1 tablet by mouth 2 (two) times daily.    Marland Kitchen apixaban (ELIQUIS) 5 MG TABS tablet Take 5 mg by mouth 2 (two) times daily.    Marland Kitchen atorvastatin (LIPITOR) 80 MG tablet Take 1 tablet (80 mg total) by mouth daily. 30 tablet 3  . buPROPion (WELLBUTRIN XL) 300 MG 24 hr tablet Take 300 mg by mouth daily.    Marland Kitchen diltiazem (DILACOR XR) 240 MG 24 hr capsule Take 240 mg by mouth daily.    Marland Kitchen doxercalciferol (HECTOROL) 2.5 MCG capsule Take 2.5 mcg by mouth daily.    . DULoxetine (CYMBALTA) 60 MG capsule Take 1 capsule (60 mg total) by mouth daily. 90 capsule 1  . Ergocalciferol (VITAMIN D2 PO) Take 50,000 Units by mouth every 30 (thirty) days.    . Fluticasone Furoate-Vilanterol (BREO ELLIPTA IN) Inhale 1 puff into the lungs.    . furosemide (LASIX) 40 MG tablet Take 40-80 mg by mouth daily.    Marland Kitchen gabapentin (NEURONTIN) 300 MG capsule Take 600 mg by mouth 6 (six) times daily.    . insulin aspart (NOVOLOG) 100 UNIT/ML  injection Inject 8-28 Units into the skin 3 (three) times daily before meals.    . insulin glargine (LANTUS) 100 UNIT/ML injection Inject 60 Units into the skin at bedtime.    Marland Kitchen ipratropium-albuterol (DUONEB) 0.5-2.5 (3) MG/3ML SOLN Take 3 mLs by nebulization.    Marland Kitchen levothyroxine (SYNTHROID, LEVOTHROID) 112 MCG tablet Take 112 mcg by mouth daily before breakfast.    . montelukast (SINGULAIR) 10 MG tablet Take 10 mg by mouth at bedtime.    . nebivolol (BYSTOLIC) 10 MG tablet Take 10 mg by mouth daily.    Marland Kitchen nystatin ointment (MYCOSTATIN) Apply 1 application topically 2 (two) times daily. 30 g 0  . olopatadine (PATANOL) 0.1 % ophthalmic solution Place 1 drop into the left eye 2 (two) times daily as needed.     . ranolazine (RANEXA) 500 MG 12 hr tablet Take 500 mg by mouth 2 (two) times daily.    . roflumilast (DALIRESP) 500 MCG TABS tablet Take 500 mcg by mouth daily.    Marland Kitchen spironolactone (ALDACTONE) 25 MG tablet Take 25 mg by mouth daily.    Marland Kitchen testosterone (ANDROGEL) 50 MG/5GM (1%) GEL Place 5 g onto the skin daily. Uses 6 pumps daily    . tiotropium (SPIRIVA) 18 MCG inhalation capsule Place 18  mcg into inhaler and inhale daily.     No current facility-administered medications on file prior to visit.    Allergies  Allergen Reactions  . Dust Mite Extract Other (See Comments)    Runny nose   . Pollen Extract Other (See Comments)    Runny nose    Family History  Problem Relation Age of Onset  . COPD Father   . Diabetes Neg Hx     History   Social History  . Marital Status: Single    Spouse Name: N/A  . Number of Children: N/A  . Years of Education: N/A   Social History Main Topics  . Smoking status: Former Research scientist (life sciences)  . Smokeless tobacco: Not on file  . Alcohol Use: No  . Drug Use: Not on file  . Sexual Activity: Not on file   Other Topics Concern  . None   Social History Narrative   Review of Systems - See HPI.  All other ROS are negative.  BP 143/65 mmHg  Pulse 102   Temp(Src) 97.8 F (36.6 C) (Oral)  Ht 5\' 11"  (1.803 m)  Wt 229 lb 6.4 oz (104.055 kg)  BMI 32.01 kg/m2  SpO2 97%  Physical Exam  Constitutional: He is oriented to person, place, and time and well-developed, well-nourished, and in no distress.  HENT:  Head: Normocephalic and atraumatic.  Eyes: Conjunctivae are normal.  Cardiovascular: Normal rate, regular rhythm, normal heart sounds and intact distal pulses.   Pulmonary/Chest: Effort normal and breath sounds normal. No respiratory distress. He has no wheezes. He has no rales. He exhibits no tenderness.  Musculoskeletal:       Right shoulder: He exhibits pain. He exhibits normal range of motion, no tenderness, no spasm, normal pulse and normal strength.  Neurological: He is alert and oriented to person, place, and time.  Skin: Skin is warm and dry. No rash noted.  Psychiatric: Affect normal.  Vitals reviewed.   Recent Results (from the past 2160 hour(s))  Lipid panel     Status: Abnormal   Collection Time: 08/30/14 10:00 AM  Result Value Ref Range   Cholesterol 217 (H) 0 - 200 mg/dL    Comment: ATP III Classification       Desirable:  < 200 mg/dL               Borderline High:  200 - 239 mg/dL          High:  > = 240 mg/dL   Triglycerides (H) 0.0 - 149.0 mg/dL    412.0 Triglyceride is over 400; calculations on Lipids are invalid.    Comment: Normal:  <150 mg/dLBorderline High:  150 - 199 mg/dL   HDL 37.10 (L) >39.00 mg/dL   Total CHOL/HDL Ratio 6     Comment:                Men          Women1/2 Average Risk     3.4          3.3Average Risk          5.0          4.42X Average Risk          9.6          7.13X Average Risk          15.0          11.0  CBC with Differential/Platelet     Status: Abnormal   Collection Time: 08/30/14 10:00 AM  Result Value Ref Range   WBC 7.7 4.0 - 10.5 K/uL   RBC 3.77 (L) 4.22 - 5.81 Mil/uL   Hemoglobin 11.8 (L) 13.0 - 17.0 g/dL   HCT 34.0 (L) 39.0 - 52.0 %   MCV 90.2 78.0 -  100.0 fl   MCHC 34.6 30.0 - 36.0 g/dL   RDW 13.0 11.5 - 15.5 %   Platelets 276.0 150.0 - 400.0 K/uL   Neutrophils Relative % 70.0 43.0 - 77.0 %   Lymphocytes Relative 20.1 12.0 - 46.0 %   Monocytes Relative 5.9 3.0 - 12.0 %   Eosinophils Relative 3.4 0.0 - 5.0 %   Basophils Relative 0.6 0.0 - 3.0 %   Neutro Abs 5.4 1.4 - 7.7 K/uL   Lymphs Abs 1.5 0.7 - 4.0 K/uL   Monocytes Absolute 0.5 0.1 - 1.0 K/uL   Eosinophils Absolute 0.3 0.0 - 0.7 K/uL   Basophils Absolute 0.0 0.0 - 0.1 K/uL  Testosterone, Free, Total, SHBG     Status: Abnormal   Collection Time: 08/30/14 10:00 AM  Result Value Ref Range   Testosterone 90 (L) 348 - 1197 ng/dL   Comment, Testosterone Comment     Comment: Adult male reference interval is based on a population of lean males up to 77 years old.    Testosterone, Free 1.4 (L) 6.6 - 18.1 pg/mL   Sex Hormone Binding 48.7 19.3 - 76.4 nmol/L  Hemoglobin A1c     Status: Abnormal   Collection Time: 08/30/14 10:00 AM  Result Value Ref Range   Hgb A1c MFr Bld 9.2 (H) 4.6 - 6.5 %    Comment: Glycemic Control Guidelines for People with Diabetes:Non Diabetic:  <6%Goal of Therapy: <7%Additional Action Suggested:  >8%   Comprehensive metabolic panel     Status: Abnormal   Collection Time: 08/30/14 10:00 AM  Result Value Ref Range   Sodium 140 135 - 145 mEq/L   Potassium 4.0 3.5 - 5.1 mEq/L   Chloride 111 96 - 112 mEq/L   CO2 21 19 - 32 mEq/L   Glucose, Bld 242 (H) 70 - 99 mg/dL   BUN 28 (H) 6 - 23 mg/dL   Creatinine, Ser 2.19 (H) 0.40 - 1.50 mg/dL   Total Bilirubin 0.3 0.2 - 1.2 mg/dL   Alkaline Phosphatase 146 (H) 39 - 117 U/L   AST 13 0 - 37 U/L   ALT 10 0 - 53 U/L   Total Protein 6.2 6.0 - 8.3 g/dL   Albumin 3.6 3.5 - 5.2 g/dL   Calcium 8.9 8.4 - 10.5 mg/dL   GFR 31.21 (L) >60.00 mL/min  TSH     Status: None   Collection Time: 08/30/14 10:00 AM  Result Value Ref Range   TSH 1.78 0.35 - 4.50 uIU/mL  T4, free     Status: None   Collection Time: 08/30/14 10:00  AM  Result Value Ref Range   Free T4 1.02 0.60 - 1.60 ng/dL  LDL cholesterol, direct     Status: None   Collection Time: 08/30/14 10:00 AM  Result Value Ref Range   Direct LDL 131.0 mg/dL    Comment: Optimal:  <100 mg/dLNear or Above Optimal:  100-129 mg/dLBorderline High:  130-159 mg/dLHigh:  160-189 mg/dLVery High:  >190 mg/dL  Specimen status report     Status: None (Preliminary result)   Collection Time: 08/30/14 10:00 AM  Result Value Ref  Range   specimen status report Comment     Comment: Specimen Inquiry Specimen Inquiry    Assessment/Plan: Chronic right shoulder pain Will obtain x-ray to further assess. Salon pas patches encouraged. Tylenol for pain if needed. Supportive measures reviewed.  Will refer to Sports medicine or Ortho based on results.

## 2014-09-12 ENCOUNTER — Other Ambulatory Visit: Payer: Self-pay | Admitting: Physician Assistant

## 2014-09-12 DIAGNOSIS — G8929 Other chronic pain: Secondary | ICD-10-CM

## 2014-09-12 DIAGNOSIS — M25511 Pain in right shoulder: Principal | ICD-10-CM

## 2014-09-13 ENCOUNTER — Encounter: Payer: Medicare Other | Attending: Endocrinology | Admitting: Nutrition

## 2014-09-13 DIAGNOSIS — E1165 Type 2 diabetes mellitus with hyperglycemia: Secondary | ICD-10-CM | POA: Insufficient documentation

## 2014-09-13 DIAGNOSIS — Z713 Dietary counseling and surveillance: Secondary | ICD-10-CM | POA: Diagnosis not present

## 2014-09-13 DIAGNOSIS — Z794 Long term (current) use of insulin: Secondary | ICD-10-CM | POA: Diagnosis not present

## 2014-09-13 DIAGNOSIS — IMO0002 Reserved for concepts with insufficient information to code with codable children: Secondary | ICD-10-CM

## 2014-09-13 NOTE — Patient Instructions (Signed)
Test 2hr. After cereal and fruit and milk meal.  If over 200, stop the cereal. Stop the overnight snacking.  If eating during the night, take 2u of Novolog Do not adjust the dose of lantus based on night time blood sugar readings.

## 2014-09-13 NOTE — Progress Notes (Signed)
Patient reports that he forgets to take his Lantus about once a week now.  He is also adjusting  his Lantus dose  Based on his HS reading.  If under 200, he will reduce the dose by 5-10u.  When I asked him if his blood sugars are higher all of the next day, he said he did not know.  We discussed the need to keep this dose the same, and that he needs to try taking the 60u of lantus with a HS reading of 200,.  He he drops low he needs to call here and we will reduce this dose. He is taking Novolog based on a sliding scaled and has a good understanding of doing this.  He is not adjusting the base dose based on the amounts of carbs eaten at each meal.   Discussed the importance of doing this, and he reported good understanding of this. Bfast meal is sweet cereal with fruit and milk.  He was told to test ac and 2hr. pc after this meal, and if the 2hr. pc reading is over 200, he needs to stop this.  Other suggestions given for this. Lunch and supper if eating at his center, are balanced, but the meals vary in the amount of carbs eaten--mostly because he is having a desert. He is also waking up during the night and snacking without taking additional Novolog.  Suggested he take 2u for this, but explained that this will cause him to gain weight.  He reported good understanding of this and said he would try to reduce this late night snacking.

## 2014-09-21 ENCOUNTER — Ambulatory Visit (INDEPENDENT_AMBULATORY_CARE_PROVIDER_SITE_OTHER): Payer: Medicare Other | Admitting: Pulmonary Disease

## 2014-09-21 ENCOUNTER — Encounter: Payer: Self-pay | Admitting: Pulmonary Disease

## 2014-09-21 ENCOUNTER — Ambulatory Visit (HOSPITAL_BASED_OUTPATIENT_CLINIC_OR_DEPARTMENT_OTHER)
Admission: RE | Admit: 2014-09-21 | Discharge: 2014-09-21 | Disposition: A | Discharge status: Home / Self Care | Payer: Medicare Other | Source: Ambulatory Visit | Attending: Pulmonary Disease | Admitting: Pulmonary Disease

## 2014-09-21 VITALS — BP 132/77 | HR 80 | Temp 97.4°F | Ht 72.0 in | Wt 230.0 lb

## 2014-09-21 DIAGNOSIS — R918 Other nonspecific abnormal finding of lung field: Secondary | ICD-10-CM | POA: Insufficient documentation

## 2014-09-21 DIAGNOSIS — R0602 Shortness of breath: Secondary | ICD-10-CM | POA: Diagnosis present

## 2014-09-21 DIAGNOSIS — R0902 Hypoxemia: Secondary | ICD-10-CM

## 2014-09-21 DIAGNOSIS — R05 Cough: Secondary | ICD-10-CM | POA: Insufficient documentation

## 2014-09-21 DIAGNOSIS — K449 Diaphragmatic hernia without obstruction or gangrene: Secondary | ICD-10-CM | POA: Insufficient documentation

## 2014-09-21 DIAGNOSIS — J449 Chronic obstructive pulmonary disease, unspecified: Secondary | ICD-10-CM

## 2014-09-21 DIAGNOSIS — J9610 Chronic respiratory failure, unspecified whether with hypoxia or hypercapnia: Secondary | ICD-10-CM | POA: Insufficient documentation

## 2014-09-21 NOTE — Patient Instructions (Addendum)
Stay on [place] - use plain albuterol instead CXR today -obtain CXR reports from [facility]

## 2014-09-21 NOTE — Assessment & Plan Note (Addendum)
Stay on [place] - use plain albuterol instead CXR today -obtain CXR reports from [facility]

## 2014-09-21 NOTE — Assessment & Plan Note (Signed)
We will have DME assess him for a portable concentrator Emphasized using portable oxygen.

## 2014-09-21 NOTE — Progress Notes (Signed)
   Subjective:    Patient ID: Ricky Baker, male    DOB: May 27, 1935, 79 y.o.   MRN: [id number]  HPI    Review of Systems  Constitutional: Negative for fever, chills, activity change, appetite change and unexpected weight change.  HENT: Negative for congestion, dental problem, postnasal drip, rhinorrhea, sneezing, sore throat, trouble swallowing and voice change.   Eyes: Negative for visual disturbance.  Respiratory: Positive for shortness of breath. Negative for cough and choking.   Cardiovascular: Negative for chest pain and leg swelling.  Gastrointestinal: Negative for nausea, vomiting and abdominal pain.  Genitourinary: Negative for difficulty urinating.  Musculoskeletal: Negative for arthralgias.  Skin: Negative for rash.  Psychiatric/Behavioral: Negative for behavioral problems and confusion.       Objective:   Physical Exam        Assessment & Plan:

## 2014-09-21 NOTE — Progress Notes (Signed)
Subjective:    Patient ID: Ricky Baker, male    DOB: 1936/02/04, 79 y.o.   MRN: [id number]  HPI  79 year old heavy ex-smoker presents for management of COPD. He has moved from [place] to be closer to her sister's due to his failing health. He smoked about 40 pack years before he quit in 2004. He is maintained on a regimen of breo, Spiriva, DuoNeb's and Daliresp-by his pulmonologist in [place]. He has been maintained on oxygen for 3 years-he uses portable oxygen only as needed and wonders if he needs a portable concentrator. He has a history of falls, was hospitalized for 5 days with broken ribs and underwent rehabilitation, now walks with a walker. He did pulmonary rehabilitation for 5 years. He has insulin requiring diabetes, and occasionally has low sugars. He is atrial fibrillation on amiodarone and apixaban He has congestive heart failure- and takes Lasix as needed. He had esophageal cancer in 2006 status post gastric pull-up surgery at [facility]  Significant tests/ events  echo 08/2014 >> nml LVEF, mod AS , AVA 1.1 Spirometry  09/2014 >> severe airway obstruction, FEV1 47%, ratio 63, FVC 56% 09/2014 >> desat to 78% on walking in the office.   Past Medical History  Diagnosis Date  . COPD (chronic obstructive pulmonary disease)   . CHF (congestive heart failure)   . Thyroid disease   . Chronic bronchitis   . Atrial fibrillation   . CKD (chronic kidney disease)   . Diabetes mellitus without complication   . Allergy   . Cancer     esophogeal   . CKD (chronic kidney disease)   . Depression   . Hypertension     Past Surgical History  Procedure Laterality Date  . Esophagus surgery  2006  . Spine surgery      bone spurs  . Cardiac catheterization  2009    two stents placed  . Tonsillectomy      Allergies  Allergen Reactions  . Dust Mite Extract Other (See Comments)    Runny nose   . Pollen Extract Other (See Comments)    Runny nose    History   Social History   . Marital Status: Single    Spouse Name: N/A  . Number of Children: N/A  . Years of Education: N/A   Occupational History  . Not on file.   Social History Main Topics  . Smoking status: Former Research scientist (life sciences)  . Smokeless tobacco: Not on file  . Alcohol Use: No  . Drug Use: Not on file  . Sexual Activity: Not on file   Other Topics Concern  . Not on file   Social History Narrative     Review of Systems neg for any significant sore throat, dysphagia, itching, sneezing, nasal congestion or excess/ purulent secretions, fever, chills, sweats, unintended wt loss, pleuritic or exertional cp, hempoptysis, orthopnea pnd or change in chronic leg swelling. Also denies presyncope, palpitations, heartburn, abdominal pain, nausea, vomiting, diarrhea or change in bowel or urinary habits, dysuria,hematuria, rash, arthralgias, visual complaints, headache, numbness weakness or ataxia.     Objective:   Physical Exam  Gen. Pleasant, well-nourished, in no distress, elderly, normal affect ENT - no lesions, no post nasal drip Neck: No JVD, no thyromegaly, no carotid bruits Lungs: no use of accessory muscles, no dullness to percussion, decreased without rales or rhonchi  Cardiovascular: Rhythm regular, heart sounds  normal, no murmurs or gallops, 1+ peripheral edema Abdomen: soft and non-tender, no hepatosplenomegaly, BS normal. Musculoskeletal:  No deformities, no cyanosis or clubbing Neuro:  alert, non focal       Assessment & Plan:

## 2014-09-22 NOTE — Progress Notes (Signed)
Quick Note:  Called and spoke with pt. Reviewed results and recs. Pt voiced understanding and had no further questions. ______ 

## 2014-09-27 ENCOUNTER — Other Ambulatory Visit: Payer: Self-pay | Admitting: Endocrinology

## 2014-09-27 ENCOUNTER — Other Ambulatory Visit (INDEPENDENT_AMBULATORY_CARE_PROVIDER_SITE_OTHER): Payer: Medicare Other

## 2014-09-27 DIAGNOSIS — E785 Hyperlipidemia, unspecified: Secondary | ICD-10-CM

## 2014-09-27 DIAGNOSIS — IMO0002 Reserved for concepts with insufficient information to code with codable children: Secondary | ICD-10-CM

## 2014-09-27 DIAGNOSIS — E1165 Type 2 diabetes mellitus with hyperglycemia: Secondary | ICD-10-CM

## 2014-09-27 LAB — BASIC METABOLIC PANEL
BUN: 28 mg/dL — ABNORMAL HIGH (ref 6–23)
CALCIUM: 8.7 mg/dL (ref 8.4–10.5)
CO2: 24 mEq/L (ref 19–32)
Chloride: 106 mEq/L (ref 96–112)
Creatinine, Ser: 2.22 mg/dL — ABNORMAL HIGH (ref 0.40–1.50)
GFR: 30.71 mL/min — AB (ref >60.00)
GLUCOSE: 263 mg/dL — AB (ref 70–99)
Potassium: 3.6 mEq/L (ref 3.5–5.1)
SODIUM: 140 meq/L (ref 135–145)

## 2014-09-27 LAB — LDL CHOLESTEROL, DIRECT: Direct LDL: 169 mg/dL

## 2014-09-27 LAB — LIPID PANEL
CHOL/HDL RATIO: 8
Cholesterol: 255 mg/dL — ABNORMAL HIGH (ref 0–200)
HDL: 31.3 mg/dL — ABNORMAL LOW (ref >39.00)
Triglycerides: 409 mg/dL — ABNORMAL HIGH (ref 0.0–149.0)

## 2014-09-28 LAB — FRUCTOSAMINE: Fructosamine: 316 umol/L — ABNORMAL HIGH (ref 0–285)

## 2014-10-02 ENCOUNTER — Encounter: Payer: Self-pay | Admitting: Endocrinology

## 2014-10-02 ENCOUNTER — Ambulatory Visit (INDEPENDENT_AMBULATORY_CARE_PROVIDER_SITE_OTHER): Payer: Medicare Other | Admitting: Endocrinology

## 2014-10-02 VITALS — BP 110/60 | HR 98 | Temp 97.7°F | Resp 16 | Ht 71.5 in | Wt 228.0 lb

## 2014-10-02 DIAGNOSIS — E785 Hyperlipidemia, unspecified: Secondary | ICD-10-CM | POA: Diagnosis not present

## 2014-10-02 DIAGNOSIS — E1165 Type 2 diabetes mellitus with hyperglycemia: Secondary | ICD-10-CM

## 2014-10-02 DIAGNOSIS — E291 Testicular hypofunction: Secondary | ICD-10-CM | POA: Diagnosis not present

## 2014-10-02 DIAGNOSIS — IMO0002 Reserved for concepts with insufficient information to code with codable children: Secondary | ICD-10-CM

## 2014-10-02 NOTE — Progress Notes (Signed)
Patient ID: Ricky Baker, male   DOB: [birth year], 79 y.o.   MRN: [id number]           Reason for Appointment: Follow-up  for Type 2 Diabetes  Referring physician: Raiford Noble  History of Present Illness:          Date of diagnosis of type 2 diabetes mellitus:?  30 years         Background history:  The patient was on oral medications for the first few years of his diagnosis, not clear what control he had at that time After about 10 years because of his problem with esophageal cancer and not being able to eat normal food he was on insulin Subsequently this was continued, partly because of his kidney function not being normal and metformin being discontinued He thinks he has been on Lantus insulin for several years along with Novolog Details of his previous control is not available but apparently it had been dependent on his compliance  Recent history:   INSULIN regimen is described as: Novolog insulin 10 at breakfast and supper, 16 at lunch.  Lantus 60 units at bedtime     He was seen in consultation in 5/16 and he had been quite irregular with his insulin regimen and has not been motivated to take care of his diabetes Also is checking blood sugars sporadically as well as not taking his Lantus consistently His A1c was 9.2% He was encouraged to be better compliant with his Lantus insulin at night and not adjusted based on the bedtime reading Also was advised to increase his Novolog with meals especially his main meal at lunch  Current blood sugar patterns and problems identified:    He is checking his blood sugars more often, up to 3 times a day now  Lantus insulin: He has tried to be more compliant with this and usually taking it at night at bedtime or so  However he is adjusting the Lantus again based on his blood sugar at bedtime which is quite variable  HIGHEST blood sugars are late at night, averaging 220 and he thinks this is mostly from eating snacks throughout the  evening including cookies  With taking variable doses of Lantus at night his blood sugars tend to come down in the morning and occasionally are low normal  However blood sugars are relatively higher in the afternoon and variable in the evenings  Postprandial readings after supper are not usually being checked recently but are usually not very high since he does not eat much at suppertime   His overall average blood sugar for the last month is now 174, previously around 260  Still has significant variability in his blood sugars especially late at night and also some in the afternoon Hypoglycemia:   occasionally in the late afternoon and once after supper, lowest reading 53  Oral hypoglycemic drugs the patient is taking are:   none     Compliance with the medical regimen: Fair to poor  Glucose monitoring:  done 1 up to 3 times a day on an average         Glucometer:  Accu-Chek Blood Glucose readings by time of day and averages from meter download:  Mean values apply above for all meters except median for One Touch  PRE-MEAL Fasting Lunch Dinner  overnight  Overall  Glucose range: 91-195   94-337   61-266   133-338   53-338   Mean/median:     174   POST-MEAL  PC Breakfast PC Lunch PC Dinner  Glucose range:   53-151  Mean/median:       Self-care: The diet that the patient has been following is: None.  Eating out at restaurants about 4-5 times a week, usually at fast food places Meal times: Breakfast: 9 AM Lunch: 1 pm Dinner: 6pm  Typical meal intake: Breakfast is cereal              Dietician visit, most recent: A few years ago               Exercise:  none, unable to do much activity because of his multiple medical problems and balance issues   Weight history: He has gradually lost about 40 pounds over the last year and a half   Wt Readings from Last 3 Encounters:  10/02/14 228 lb (103.42 kg)  09/21/14 230 lb (104.327 kg)  09/11/14 229 lb 6.4 oz (104.055 kg)   Glycemic  control: Last A1c was 8% or more in March    Lab Results  Component Value Date   HGBA1C 9.2* 08/30/2014   Lab Results  Component Value Date   CREATININE 2.22* 09/27/2014    OTHER medical problems are discussed in review of systems      Medication List       This list is accurate as of: 10/02/14  8:47 PM.  Always use your most recent med list.               amiodarone 200 MG tablet  Commonly known as:  PACERONE  Take 200 mg by mouth 2 (two) times daily.     amoxicillin-clavulanate 875-125 MG per tablet  Commonly known as:  AUGMENTIN  Take 1 tablet by mouth 2 (two) times daily.     apixaban 5 MG Tabs tablet  Commonly known as:  ELIQUIS  Take 5 mg by mouth 2 (two) times daily.     atorvastatin 80 MG tablet  Commonly known as:  LIPITOR  Take 1 tablet (80 mg total) by mouth daily.     BREO ELLIPTA IN  Inhale 1 puff into the lungs.     buPROPion 300 MG 24 hr tablet  Commonly known as:  WELLBUTRIN XL  Take 300 mg by mouth daily.     diltiazem 240 MG 24 hr capsule  Commonly known as:  DILACOR XR  Take 240 mg by mouth daily.     doxercalciferol 2.5 MCG capsule  Commonly known as:  HECTOROL  Take 2.5 mcg by mouth daily.     DULoxetine 60 MG capsule  Commonly known as:  CYMBALTA  Take 1 capsule (60 mg total) by mouth daily.     furosemide 40 MG tablet  Commonly known as:  LASIX  Take 40-80 mg by mouth daily.     gabapentin 300 MG capsule  Commonly known as:  NEURONTIN  Take 600 mg by mouth 6 (six) times daily.     HYDROcodone-acetaminophen 5-325 MG per tablet  Commonly known as:  NORCO/VICODIN  Take 5-325 tablets by mouth as needed.     insulin aspart 100 UNIT/ML injection  Commonly known as:  novoLOG  Inject 8-28 Units into the skin 3 (three) times daily before meals.     insulin glargine 100 UNIT/ML injection  Commonly known as:  LANTUS  Inject 60 Units into the skin at bedtime.     ipratropium-albuterol 0.5-2.5 (3) MG/3ML Soln  Commonly known  as:  DUONEB  Take 3 mLs by  nebulization.     levothyroxine 112 MCG tablet  Commonly known as:  SYNTHROID, LEVOTHROID  Take 112 mcg by mouth daily before breakfast.     montelukast 10 MG tablet  Commonly known as:  SINGULAIR  Take 10 mg by mouth at bedtime.     nebivolol 10 MG tablet  Commonly known as:  BYSTOLIC  Take 10 mg by mouth daily.     nystatin ointment  Commonly known as:  MYCOSTATIN  Apply 1 application topically 2 (two) times daily.     olopatadine 0.1 % ophthalmic solution  Commonly known as:  PATANOL  Place 1 drop into the left eye 2 (two) times daily as needed.     OXYGEN  Inhale into the lungs. 2L all day and night     ranolazine 500 MG 12 hr tablet  Commonly known as:  RANEXA  Take 500 mg by mouth 2 (two) times daily.     roflumilast 500 MCG Tabs tablet  Commonly known as:  DALIRESP  Take 500 mcg by mouth daily.     spironolactone 25 MG tablet  Commonly known as:  ALDACTONE  Take 25 mg by mouth daily.     testosterone 50 MG/5GM (1%) Gel  Commonly known as:  ANDROGEL  Place 5 g onto the skin daily. Uses 6 pumps daily     tiotropium 18 MCG inhalation capsule  Commonly known as:  SPIRIVA  Place 18 mcg into inhaler and inhale daily.     VITAMIN D2 PO  Take 50,000 Units by mouth every 30 (thirty) days.        Allergies:  Allergies  Allergen Reactions  . Dust Mite Extract Other (See Comments)    Runny nose   . Pollen Extract Other (See Comments)    Runny nose    Past Medical History  Diagnosis Date  . COPD (chronic obstructive pulmonary disease)   . CHF (congestive heart failure)   . Thyroid disease   . Chronic bronchitis   . Atrial fibrillation   . CKD (chronic kidney disease)   . Diabetes mellitus without complication   . Allergy   . Cancer     esophogeal   . CKD (chronic kidney disease)   . Depression   . Hypertension     Past Surgical History  Procedure Laterality Date  . Esophagus surgery  2006  . Spine surgery       bone spurs  . Cardiac catheterization  2009    two stents placed  . Tonsillectomy      Family History  Problem Relation Age of Onset  . COPD Father   . Diabetes Neg Hx     Social History:  reports that he quit smoking about 13 years ago. His smoking use included Cigarettes. He has a 42 pack-year smoking history. He does not have any smokeless tobacco history on file. He reports that he does not drink alcohol. His drug history is not on file.    Review of Systems    Lipid history: On Lipitor for long time     Lab Results  Component Value Date   CHOL 255* 09/27/2014   HDL 31.30* 09/27/2014   LDLDIRECT 169.0 09/27/2014   TRIG * 09/27/2014    409.0 Triglyceride is over 400; calculations on Lipids are invalid.   CHOLHDL 8 09/27/2014            Eyes:   Most recent eye exam was 5/16 and reportedly normal, he saw an optometrist  in [place]  Hypertension: Present for several years and treated with multiple drugs including diltiazem, Bystolic and Aldactone   Neurological:   Has long-standing numbness and pains or tingling in feet controlled with gabapentin  Foot exam in 5/16 showed the following: Pedal pulses absent, monofilament sensation absent except mild sensation present in the left distal toes  Psychiatric:  Has symptoms of depression and appears rather despondent today, has not discussed management of this with PCP  Endocrine: No unusual fatigue, cold intolerance  He has a history of thyroid disease for about 52 yrs and taking thyroid supplements, previously followed by endocrinologist.  He has been on the same dose for about 2 years  HYPOGONADISM: He was treated by a urologist for about 6 years, previously was told to have hypogonadism when he was complaining of feeling lethargic and decreased libido.  Has not taken any AndroGel for about a year now.   He has significant lack of motivation and some fatigue, his free testosterone level is markedly decreased  Lab Results   Component Value Date   TESTOSTERONE 90* 08/30/2014    CKD: Previously followed by nephrologist, GFR 31     Lab on 09/27/2014  Component Date Value Ref Range Status  . Sodium 09/27/2014 140  135 - 145 mEq/L Final  . Potassium 09/27/2014 3.6  3.5 - 5.1 mEq/L Final  . Chloride 09/27/2014 106  96 - 112 mEq/L Final  . CO2 09/27/2014 24  19 - 32 mEq/L Final  . Glucose, Bld 09/27/2014 263* 70 - 99 mg/dL Final  . BUN 09/27/2014 28* 6 - 23 mg/dL Final  . Creatinine, Ser 09/27/2014 2.22* 0.40 - 1.50 mg/dL Final  . Calcium 09/27/2014 8.7  8.4 - 10.5 mg/dL Final  . GFR 09/27/2014 30.71* >60.00 mL/min Final  . Cholesterol 09/27/2014 255* 0 - 200 mg/dL Final   ATP III Classification       Desirable:  < 200 mg/dL               Borderline High:  200 - 239 mg/dL          High:  > = 240 mg/dL  . Triglycerides 09/27/2014 409.0 Triglyceride is over 400; calculations on Lipids are invalid.* 0.0 - 149.0 mg/dL Final   Normal:  <150 mg/dLBorderline High:  150 - 199 mg/dL  . HDL 09/27/2014 31.30* >39.00 mg/dL Final  . Total CHOL/HDL Ratio 09/27/2014 8   Final                  Men          Women1/2 Average Risk     3.4          3.3Average Risk          5.0          4.42X Average Risk          9.6          7.13X Average Risk          15.0          11.0                      . Fructosamine 09/27/2014 316* 0 - 285 umol/L Final   Comment: Published reference interval for apparently healthy subjects between age 48 and 56 is 59 - 285 umol/L and in a poorly controlled diabetic population is 228 - 563 umol/L with a mean of 396 umol/L.   Marland Kitchen Direct LDL  09/27/2014 169.0   Final   Optimal:  <100 mg/dLNear or Above Optimal:  100-129 mg/dLBorderline High:  130-159 mg/dLHigh:  160-189 mg/dLVery High:  >190 mg/dL    Physical Examination:  BP 110/60 mmHg  Pulse 98  Temp(Src) 97.7 F (36.5 C) (Oral)  Resp 16  Ht 5' 11.5" (1.816 m)  Wt 228 lb (103.42 kg)  BMI 31.36 kg/m2  SpO2 91%   ASSESSMENT:  Diabetes type  2, uncontrolled   See history of present illness for detailed discussion of his current management, blood sugar patterns and problems identified He does have somewhat better control of his sugars since his last visit after encouraging him to be more compliant with glucose monitoring and basal bolus insulin regimen  As discussed above he has his highest readings around bedtime because of snacking in the evenings Also and currently adjusting his Lantus based on bedtime reading which may tend to drop his sugars too variable extent overnight but currently not having overnight hypoglycemia. Postprandial readings during the day are depending on his compliance with diet and are still averaging over 200 in the afternoon after his main meal which is at lunch  HYPOGONADISM: He has marked decrease in testosterone levels and this is likely to be from metabolic syndrome.  Although he is 79 years old he may be symptomatic with fatigue and significant decrease in motivation and some overt depression also  HYPOTHYROIDISM: He has had long-standing hypothyroidism, likely autoimmune and last TSH was normal  HYPERLIPIDEMIA: With his history of coronary artery disease he does need to be on at least moderate intensity statin which he has not taken and discussed importance of taking this, he can use a half of the 80 mg Lipitor when he gets the prescription  Depression: He will discussed management with PCP   PLAN :   He needs to switch Lantus to the morning to avoid adjustment based on bedtime reading and also to potentially avoid overnight hypoglycemia.  Given him a flow sheet to adjust the Lantus based on fasting blood sugar every 3 days, to start with 60 units for now  This may also help his daytime blood sugars and they are relatively higher in the afternoon  Continue adjusting Novolog based on what he is eating especially at suppertime where he may need less insulin, this was discussed  Watch diet better with  less carbohydrate snacking in the evenings  Restart AndroGel and Lipitor as discussed in instructions  Patient Instructions  Need to consider Toujeo instead of Lantus before next Rx  Adjust Novolog at supper based on what you eat, may reduce to 6 if needed  Change Lantus to breakfast daily and start with 60 units and adjust as discussed  Androgel start with 2 pumps on each shoulder in am daily  Start back on Lipitor; take 20 mg daily if still have it     Counseling time on subjects discussed above is over 50% of today's 25 minute visit  Jeanann Balinski 10/02/2014, 8:47 PM   Note: This office note was prepared with Dragon voice recognition system technology. Any transcriptional errors that result from this process are unintentional.

## 2014-10-02 NOTE — Patient Instructions (Addendum)
Need to consider Toujeo instead of Lantus before next Rx  Adjust Novolog at supper based on what you eat, may reduce to 6 if needed  Change Lantus to breakfast daily and start with 60 units and adjust as discussed  Androgel start with 2 pumps on each shoulder in am daily  Start back on Lipitor; take 20 mg daily if still have it

## 2014-10-05 ENCOUNTER — Other Ambulatory Visit: Payer: Self-pay | Admitting: Physician Assistant

## 2014-10-18 ENCOUNTER — Telehealth: Payer: Self-pay | Admitting: Cardiology

## 2014-10-18 NOTE — Telephone Encounter (Signed)
New Message   Anderson Malta, is calling to get the Injection Fraction information

## 2014-10-18 NOTE — Telephone Encounter (Signed)
Will defer this message to Maudie Mercury in medical records to follow-up with Anderson Malta with [facility] in providing appropriate documentation requested in regards to the pts last Ejection fraction.

## 2014-10-19 ENCOUNTER — Other Ambulatory Visit: Payer: Self-pay | Admitting: Physician Assistant

## 2014-10-25 ENCOUNTER — Ambulatory Visit (HOSPITAL_BASED_OUTPATIENT_CLINIC_OR_DEPARTMENT_OTHER)
Admission: RE | Admit: 2014-10-25 | Discharge: 2014-10-25 | Disposition: A | Discharge status: Home / Self Care | Payer: Medicare Other | Source: Ambulatory Visit | Attending: Physician Assistant | Admitting: Physician Assistant

## 2014-10-25 ENCOUNTER — Ambulatory Visit (INDEPENDENT_AMBULATORY_CARE_PROVIDER_SITE_OTHER): Payer: Medicare Other | Admitting: Physician Assistant

## 2014-10-25 ENCOUNTER — Encounter: Payer: Self-pay | Admitting: Physician Assistant

## 2014-10-25 VITALS — BP 88/56 | HR 80 | Temp 97.8°F | Ht 72.0 in | Wt 230.4 lb

## 2014-10-25 DIAGNOSIS — R0601 Orthopnea: Secondary | ICD-10-CM

## 2014-10-25 DIAGNOSIS — J449 Chronic obstructive pulmonary disease, unspecified: Secondary | ICD-10-CM | POA: Diagnosis not present

## 2014-10-25 DIAGNOSIS — R0602 Shortness of breath: Secondary | ICD-10-CM

## 2014-10-25 MED ORDER — NYSTATIN 100000 UNIT/GM EX OINT: 1.0000 "application " | TOPICAL_OINTMENT | Freq: Two times a day (BID) | CUTANEOUS | Status: DC

## 2014-10-25 NOTE — Patient Instructions (Signed)
Please continue COPD medications as directed. Stay hydrated but do not drink excessive amounts of water. Increase Lasix to 40 mg twice daily for the next 3 days. Then resume regular dosing.  Do not forget to get labs and go downstairs for chest x-ray.  We will call you with your results. If anything worsens, please return immediately of get to the nearest ER. (Call 911 if alone).

## 2014-10-25 NOTE — Progress Notes (Signed)
Patient presents to clinic today c/o increased SOB above baseline COPD since Sunday. Is taking COPD medications as directed. Denies chest pain, palpitations. Endorses some lightheadedness when standing. Denies ear pressure or popping. Denies wheezing or chest tightness. Denies fever, chills, weight changes. Has been sleeping in recliner as he endorses + orthopnea and PND.  Endorses intermittent swelling in legs, but has not been taking Lasix as directed.  Past Medical History  Diagnosis Date  . COPD (chronic obstructive pulmonary disease)   . CHF (congestive heart failure)   . Thyroid disease   . Chronic bronchitis   . Atrial fibrillation   . CKD (chronic kidney disease)   . Diabetes mellitus without complication   . Allergy   . Cancer     esophogeal   . CKD (chronic kidney disease)   . Depression   . Hypertension     Current Outpatient Prescriptions on File Prior to Visit  Medication Sig Dispense Refill  . ACCU-CHEK AVIVA PLUS test strip TEST AS DIRECTED THREE TIMES DAILY 100 each 5  . amiodarone (PACERONE) 200 MG tablet Take 200 mg by mouth 2 (two) times daily.    Marland Kitchen apixaban (ELIQUIS) 5 MG TABS tablet Take 5 mg by mouth 2 (two) times daily.    Marland Kitchen atorvastatin (LIPITOR) 80 MG tablet Take 1 tablet (80 mg total) by mouth daily. 30 tablet 3  . buPROPion (WELLBUTRIN XL) 300 MG 24 hr tablet Take 300 mg by mouth daily.    Marland Kitchen diltiazem (DILACOR XR) 240 MG 24 hr capsule Take 240 mg by mouth daily.    Marland Kitchen doxercalciferol (HECTOROL) 2.5 MCG capsule Take 2.5 mcg by mouth daily.    . DULoxetine (CYMBALTA) 60 MG capsule Take 1 capsule (60 mg total) by mouth daily. 90 capsule 1  . Ergocalciferol (VITAMIN D2 PO) Take 50,000 Units by mouth every 30 (thirty) days.    . Fluticasone Furoate-Vilanterol (BREO ELLIPTA IN) Inhale 1 puff into the lungs.    . furosemide (LASIX) 40 MG tablet Take 40-80 mg by mouth daily.    Marland Kitchen gabapentin (NEURONTIN) 300 MG capsule TAKE 2 CAPSULES BY MOUTH THREE TIMES DAILY 540  capsule 0  . HYDROcodone-acetaminophen (NORCO/VICODIN) 5-325 MG per tablet Take 5-325 tablets by mouth as needed.  0  . insulin aspart (NOVOLOG) 100 UNIT/ML injection Inject 8-28 Units into the skin 3 (three) times daily before meals.    . insulin glargine (LANTUS) 100 UNIT/ML injection Inject 60 Units into the skin at bedtime.    Marland Kitchen ipratropium-albuterol (DUONEB) 0.5-2.5 (3) MG/3ML SOLN Take 3 mLs by nebulization.    Marland Kitchen levothyroxine (SYNTHROID, LEVOTHROID) 112 MCG tablet Take 112 mcg by mouth daily before breakfast.    . montelukast (SINGULAIR) 10 MG tablet Take 10 mg by mouth at bedtime.    . nebivolol (BYSTOLIC) 10 MG tablet Take 10 mg by mouth daily.    Marland Kitchen olopatadine (PATANOL) 0.1 % ophthalmic solution Place 1 drop into the left eye 2 (two) times daily as needed.     . OXYGEN Inhale into the lungs. 2L all day and night    . ranolazine (RANEXA) 500 MG 12 hr tablet Take 500 mg by mouth 2 (two) times daily.    . roflumilast (DALIRESP) 500 MCG TABS tablet Take 500 mcg by mouth daily.    Marland Kitchen spironolactone (ALDACTONE) 25 MG tablet Take 25 mg by mouth daily.    Marland Kitchen testosterone (ANDROGEL) 50 MG/5GM (1%) GEL Place 5 g onto the skin daily. Uses 6  pumps daily    . tiotropium (SPIRIVA) 18 MCG inhalation capsule Place 18 mcg into inhaler and inhale daily.     No current facility-administered medications on file prior to visit.    Allergies  Allergen Reactions  . Dust Mite Extract Other (See Comments)    Runny nose   . Pollen Extract Other (See Comments)    Runny nose    Family History  Problem Relation Age of Onset  . COPD Father   . Diabetes Neg Hx     History   Social History  . Marital Status: Single    Spouse Name: N/A  . Number of Children: N/A  . Years of Education: N/A   Social History Main Topics  . Smoking status: Former Smoker -- 1.00 packs/day for 42 years    Types: Cigarettes    Quit date: 04/07/2001  . Smokeless tobacco: Not on file  . Alcohol Use: No  . Drug Use: Not  on file  . Sexual Activity: Not on file   Other Topics Concern  . None   Social History Narrative   Review of Systems - See HPI.  All other ROS are negative.  BP 88/56 mmHg  Pulse 80  Temp(Src) 97.8 F (36.6 C) (Other (Comment))  Ht 6' (1.829 m)  Wt 230 lb 6 oz (104.497 kg)  BMI 31.24 kg/m2  SpO2 95%  Physical Exam  Constitutional: He is oriented to person, place, and time and well-developed, well-nourished, and in no distress.  HENT:  Head: Normocephalic and atraumatic.  Eyes: Conjunctivae are normal.  Neck: Neck supple.  Cardiovascular: Normal rate, normal heart sounds and intact distal pulses.   1+ pitting edema of lower extremities bilaterally.  Pulmonary/Chest: Effort normal and breath sounds normal. No respiratory distress. He has no wheezes. He has no rales. He exhibits no tenderness.  Neurological: He is alert and oriented to person, place, and time.  Skin: Skin is warm and dry. No rash noted.  Psychiatric: Affect normal.  Vitals reviewed.   Recent Results (from the past 2160 hour(s))  Lipid panel     Status: Abnormal   Collection Time: 08/30/14 10:00 AM  Result Value Ref Range   Cholesterol 217 (H) 0 - 200 mg/dL    Comment: ATP III Classification       Desirable:  < 200 mg/dL               Borderline High:  200 - 239 mg/dL          High:  > = 240 mg/dL   Triglycerides (H) 0.0 - 149.0 mg/dL    412.0 Triglyceride is over 400; calculations on Lipids are invalid.    Comment: Normal:  <150 mg/dLBorderline High:  150 - 199 mg/dL   HDL 37.10 (L) >39.00 mg/dL   Total CHOL/HDL Ratio 6     Comment:                Men          Women1/2 Average Risk     3.4          3.3Average Risk          5.0          4.42X Average Risk          9.6          7.13X Average Risk          15.0          11.0  CBC with Differential/Platelet     Status: Abnormal   Collection Time: 08/30/14 10:00 AM  Result Value Ref Range   WBC 7.7 4.0 - 10.5 K/uL   RBC 3.77 (L) 4.22 -  5.81 Mil/uL   Hemoglobin 11.8 (L) 13.0 - 17.0 g/dL   HCT 34.0 (L) 39.0 - 52.0 %   MCV 90.2 78.0 - 100.0 fl   MCHC 34.6 30.0 - 36.0 g/dL   RDW 13.0 11.5 - 15.5 %   Platelets 276.0 150.0 - 400.0 K/uL   Neutrophils Relative % 70.0 43.0 - 77.0 %   Lymphocytes Relative 20.1 12.0 - 46.0 %   Monocytes Relative 5.9 3.0 - 12.0 %   Eosinophils Relative 3.4 0.0 - 5.0 %   Basophils Relative 0.6 0.0 - 3.0 %   Neutro Abs 5.4 1.4 - 7.7 K/uL   Lymphs Abs 1.5 0.7 - 4.0 K/uL   Monocytes Absolute 0.5 0.1 - 1.0 K/uL   Eosinophils Absolute 0.3 0.0 - 0.7 K/uL   Basophils Absolute 0.0 0.0 - 0.1 K/uL  Testosterone, Free, Total, SHBG     Status: Abnormal   Collection Time: 08/30/14 10:00 AM  Result Value Ref Range   Testosterone 90 (L) 348 - 1197 ng/dL   Comment, Testosterone Comment     Comment: Adult male reference interval is based on a population of lean males up to 77 years old.    Testosterone, Free 1.4 (L) 6.6 - 18.1 pg/mL   Sex Hormone Binding 48.7 19.3 - 76.4 nmol/L  Hemoglobin A1c     Status: Abnormal   Collection Time: 08/30/14 10:00 AM  Result Value Ref Range   Hgb A1c MFr Bld 9.2 (H) 4.6 - 6.5 %    Comment: Glycemic Control Guidelines for People with Diabetes:Non Diabetic:  <6%Goal of Therapy: <7%Additional Action Suggested:  >8%   Comprehensive metabolic panel     Status: Abnormal   Collection Time: 08/30/14 10:00 AM  Result Value Ref Range   Sodium 140 135 - 145 mEq/L   Potassium 4.0 3.5 - 5.1 mEq/L   Chloride 111 96 - 112 mEq/L   CO2 21 19 - 32 mEq/L   Glucose, Bld 242 (H) 70 - 99 mg/dL   BUN 28 (H) 6 - 23 mg/dL   Creatinine, Ser 2.19 (H) 0.40 - 1.50 mg/dL   Total Bilirubin 0.3 0.2 - 1.2 mg/dL   Alkaline Phosphatase 146 (H) 39 - 117 U/L   AST 13 0 - 37 U/L   ALT 10 0 - 53 U/L   Total Protein 6.2 6.0 - 8.3 g/dL   Albumin 3.6 3.5 - 5.2 g/dL   Calcium 8.9 8.4 - 10.5 mg/dL   GFR 31.21 (L) >60.00 mL/min  TSH     Status: None   Collection Time: 08/30/14 10:00 AM  Result Value Ref  Range   TSH 1.78 0.35 - 4.50 uIU/mL  T4, free     Status: None   Collection Time: 08/30/14 10:00 AM  Result Value Ref Range   Free T4 1.02 0.60 - 1.60 ng/dL  LDL cholesterol, direct     Status: None   Collection Time: 08/30/14 10:00 AM  Result Value Ref Range   Direct LDL 131.0 mg/dL    Comment: Optimal:  <100 mg/dLNear or Above Optimal:  100-129 mg/dLBorderline High:  130-159 mg/dLHigh:  160-189 mg/dLVery High:  >190 mg/dL  Specimen status report     Status: None (Preliminary result)   Collection Time: 08/30/14 10:00 AM  Result Value Ref  Range   specimen status report Comment     Comment: Specimen Inquiry Specimen Inquiry   Basic metabolic panel     Status: Abnormal   Collection Time: 09/27/14 10:05 AM  Result Value Ref Range   Sodium 140 135 - 145 mEq/L   Potassium 3.6 3.5 - 5.1 mEq/L   Chloride 106 96 - 112 mEq/L   CO2 24 19 - 32 mEq/L   Glucose, Bld 263 (H) 70 - 99 mg/dL   BUN 28 (H) 6 - 23 mg/dL   Creatinine, Ser 2.22 (H) 0.40 - 1.50 mg/dL   Calcium 8.7 8.4 - 10.5 mg/dL   GFR 30.71 (L) >60.00 mL/min  Lipid panel     Status: Abnormal   Collection Time: 09/27/14 10:05 AM  Result Value Ref Range   Cholesterol 255 (H) 0 - 200 mg/dL    Comment: ATP III Classification       Desirable:  < 200 mg/dL               Borderline High:  200 - 239 mg/dL          High:  > = 240 mg/dL   Triglycerides (H) 0.0 - 149.0 mg/dL    409.0 Triglyceride is over 400; calculations on Lipids are invalid.    Comment: Normal:  <150 mg/dLBorderline High:  150 - 199 mg/dL   HDL 31.30 (L) >39.00 mg/dL   Total CHOL/HDL Ratio 8     Comment:                Men          Women1/2 Average Risk     3.4          3.3Average Risk          5.0          4.42X Average Risk          9.6          7.13X Average Risk          15.0          11.0                      Fructosamine     Status: Abnormal   Collection Time: 09/27/14 10:05 AM  Result Value Ref Range   Fructosamine 316 (H) 0 - 285 umol/L    Comment: Published  reference interval for apparently healthy subjects between age 43 and 7 is 63 - 285 umol/L and in a poorly controlled diabetic population is 228 - 563 umol/L with a mean of 396 umol/L.   LDL cholesterol, direct     Status: None   Collection Time: 09/27/14 10:05 AM  Result Value Ref Range   Direct LDL 169.0 mg/dL    Comment: Optimal:  <100 mg/dLNear or Above Optimal:  100-129 mg/dLBorderline High:  130-159 mg/dLHigh:  160-189 mg/dLVery High:  >190 mg/dL    Assessment/Plan: Orthopnea In patient with preserved EF but aortic stenosis and history of HF. Has not been compliant with Lasix. Lungs sound good on auscultation. Pitting edema noted. Will increase Lasix to 80 mg daily x 3 days before resuming 40 mg dosing. Will obtain labs and CXR today. Follow-up on Monday. If anything worsens, he is to go directly to ER.

## 2014-10-25 NOTE — Progress Notes (Signed)
Pre visit review using our clinic review tool, if applicable. No additional management support is needed unless otherwise documented below in the visit note. 

## 2014-10-26 ENCOUNTER — Telehealth: Payer: Self-pay | Admitting: *Deleted

## 2014-10-26 NOTE — Telephone Encounter (Signed)
-----   Message from Jamesetta Orleans, [place] sent at 10/25/2014  6:33 PM EDT ----- Called the patient no answer/voice mail on phone.

## 2014-10-26 NOTE — Telephone Encounter (Signed)
Called and spoke with the pt and informed him of recent x-ray results and note.  Pt verbalized understanding and agreed.  Pt was scheduled for follow-up on Monday (10-30-14 @ 10:30am).//AB/CMA

## 2014-10-26 NOTE — Telephone Encounter (Signed)
X-ray negative for pneumonia. Increase fluid in bronchioles most likely from COPD. No large effusion present. Continue care discussed at today's visit. Follow-up Friday if possible. Otherwise follow-up on Monday.

## 2014-10-27 DIAGNOSIS — R0601 Orthopnea: Secondary | ICD-10-CM | POA: Insufficient documentation

## 2014-10-27 NOTE — Assessment & Plan Note (Signed)
In patient with preserved EF but aortic stenosis and history of HF. Has not been compliant with Lasix. Lungs sound good on auscultation. Pitting edema noted. Will increase Lasix to 80 mg daily x 3 days before resuming 40 mg dosing. Will obtain labs and CXR today. Follow-up on Monday. If anything worsens, he is to go directly to ER.

## 2014-10-30 ENCOUNTER — Encounter: Payer: Self-pay | Admitting: Physician Assistant

## 2014-10-30 ENCOUNTER — Ambulatory Visit (INDEPENDENT_AMBULATORY_CARE_PROVIDER_SITE_OTHER): Payer: Medicare Other | Admitting: Physician Assistant

## 2014-10-30 ENCOUNTER — Other Ambulatory Visit: Payer: Self-pay | Admitting: Physician Assistant

## 2014-10-30 VITALS — BP 120/70 | HR 94 | Temp 97.6°F | Ht 72.0 in | Wt 226.2 lb

## 2014-10-30 DIAGNOSIS — R0601 Orthopnea: Secondary | ICD-10-CM | POA: Diagnosis not present

## 2014-10-30 DIAGNOSIS — R2681 Unsteadiness on feet: Secondary | ICD-10-CM

## 2014-10-30 LAB — BASIC METABOLIC PANEL
BUN: 31 mg/dL — AB (ref 6–23)
CALCIUM: 8.9 mg/dL (ref 8.4–10.5)
CO2: 22 mEq/L (ref 19–32)
CREATININE: 2.32 mg/dL — AB (ref 0.40–1.50)
Chloride: 108 mEq/L (ref 96–112)
GFR: 29.19 mL/min — AB (ref >60.00)
Glucose, Bld: 192 mg/dL — ABNORMAL HIGH (ref 70–99)
Potassium: 4.1 mEq/L (ref 3.5–5.1)
SODIUM: 140 meq/L (ref 135–145)

## 2014-10-30 LAB — BRAIN NATRIURETIC PEPTIDE: Pro B Natriuretic peptide (BNP): 117 pg/mL — ABNORMAL HIGH (ref 0.0–100.0)

## 2014-10-30 NOTE — Progress Notes (Signed)
Pre visit review using our clinic review tool, if applicable. No additional management support is needed unless otherwise documented below in the visit note. 

## 2014-10-30 NOTE — Progress Notes (Signed)
Patient presents to clinic today  For follow-up of peripheral edema and shortness of breath with exertion after restarting Lasix as directed. Patient endorses taking medications as directed, taking 3 days of increased Lasix dose as recommended. Patient endorses legs are much improved. Denies residual swelling or discomfort. Is breathing much easier. Denies chest pain, shortness of breath, palpitations or chest tightness. Denies orthopnea or PND.  Past Medical History  Diagnosis Date  . COPD (chronic obstructive pulmonary disease)   . CHF (congestive heart failure)   . Thyroid disease   . Chronic bronchitis   . Atrial fibrillation   . CKD (chronic kidney disease)   . Diabetes mellitus without complication   . Allergy   . Cancer     esophogeal   . CKD (chronic kidney disease)   . Depression   . Hypertension     Current Outpatient Prescriptions on File Prior to Visit  Medication Sig Dispense Refill  . ACCU-CHEK AVIVA PLUS test strip TEST AS DIRECTED THREE TIMES DAILY 100 each 5  . amiodarone (PACERONE) 200 MG tablet Take 200 mg by mouth 2 (two) times daily.    Marland Kitchen apixaban (ELIQUIS) 5 MG TABS tablet Take 5 mg by mouth 2 (two) times daily.    Marland Kitchen atorvastatin (LIPITOR) 80 MG tablet Take 1 tablet (80 mg total) by mouth daily. 30 tablet 3  . buPROPion (WELLBUTRIN XL) 300 MG 24 hr tablet Take 300 mg by mouth daily.    Marland Kitchen diltiazem (DILACOR XR) 240 MG 24 hr capsule Take 240 mg by mouth daily.    Marland Kitchen doxercalciferol (HECTOROL) 2.5 MCG capsule Take 2.5 mcg by mouth daily.    . DULoxetine (CYMBALTA) 60 MG capsule Take 1 capsule (60 mg total) by mouth daily. 90 capsule 1  . Ergocalciferol (VITAMIN D2 PO) Take 50,000 Units by mouth every 30 (thirty) days.    . Fluticasone Furoate-Vilanterol (BREO ELLIPTA IN) Inhale 1 puff into the lungs.    . furosemide (LASIX) 40 MG tablet Take 40-80 mg by mouth daily.    Marland Kitchen gabapentin (NEURONTIN) 300 MG capsule TAKE 2 CAPSULES BY MOUTH THREE TIMES DAILY 540 capsule  0  . HYDROcodone-acetaminophen (NORCO/VICODIN) 5-325 MG per tablet Take 5-325 tablets by mouth as needed.  0  . insulin aspart (NOVOLOG) 100 UNIT/ML injection Inject 8-28 Units into the skin 3 (three) times daily before meals.    . insulin glargine (LANTUS) 100 UNIT/ML injection Inject 60 Units into the skin at bedtime.    Marland Kitchen ipratropium-albuterol (DUONEB) 0.5-2.5 (3) MG/3ML SOLN Take 3 mLs by nebulization.    Marland Kitchen levothyroxine (SYNTHROID, LEVOTHROID) 112 MCG tablet Take 112 mcg by mouth daily before breakfast.    . montelukast (SINGULAIR) 10 MG tablet Take 10 mg by mouth at bedtime.    . nebivolol (BYSTOLIC) 10 MG tablet Take 10 mg by mouth daily.    Marland Kitchen nystatin ointment (MYCOSTATIN) Apply 1 application topically 2 (two) times daily. 30 g 0  . olopatadine (PATANOL) 0.1 % ophthalmic solution Place 1 drop into the left eye 2 (two) times daily as needed.     . OXYGEN Inhale into the lungs. 2L all day and night    . ranolazine (RANEXA) 500 MG 12 hr tablet Take 500 mg by mouth 2 (two) times daily.    . roflumilast (DALIRESP) 500 MCG TABS tablet Take 500 mcg by mouth daily.    Marland Kitchen spironolactone (ALDACTONE) 25 MG tablet Take 25 mg by mouth daily.    Marland Kitchen testosterone (ANDROGEL)  50 MG/5GM (1%) GEL Place 5 g onto the skin daily. Uses 6 pumps daily    . tiotropium (SPIRIVA) 18 MCG inhalation capsule Place 18 mcg into inhaler and inhale daily.     No current facility-administered medications on file prior to visit.    Allergies  Allergen Reactions  . Dust Mite Extract Other (See Comments)    Runny nose   . Pollen Extract Other (See Comments)    Runny nose    Family History  Problem Relation Age of Onset  . COPD Father   . Diabetes Neg Hx     History   Social History  . Marital Status: Single    Spouse Name: N/A  . Number of Children: N/A  . Years of Education: N/A   Social History Main Topics  . Smoking status: Former Smoker -- 1.00 packs/day for 42 years    Types: Cigarettes    Quit  date: 04/07/2001  . Smokeless tobacco: Not on file  . Alcohol Use: No  . Drug Use: Not on file  . Sexual Activity: Not on file   Other Topics Concern  . None   Social History Narrative    Review of Systems - See HPI.  All other ROS are negative.  BP 120/70 mmHg  Pulse 94  Temp(Src) 97.6 F (36.4 C) (Oral)  Ht 6' (1.829 m)  Wt 226 lb 3.2 oz (102.604 kg)  BMI 30.67 kg/m2  SpO2 94%  Physical Exam  Constitutional: He is oriented to person, place, and time and well-developed, well-nourished, and in no distress.  HENT:  Head: Normocephalic and atraumatic.  Eyes: Conjunctivae are normal.  Neck: Neck supple.  Cardiovascular: Normal rate, regular rhythm, normal heart sounds and intact distal pulses.   Pulmonary/Chest: Effort normal and breath sounds normal. No respiratory distress. He has no wheezes. He has no rales. He exhibits no tenderness.  Neurological: He is alert and oriented to person, place, and time.  Skin: Skin is warm and dry. No rash noted.  Psychiatric: Affect normal.  Vitals reviewed.   Recent Results (from the past 2160 hour(s))  Lipid panel     Status: Abnormal   Collection Time: 08/30/14 10:00 AM  Result Value Ref Range   Cholesterol 217 (H) 0 - 200 mg/dL    Comment: ATP III Classification       Desirable:  < 200 mg/dL               Borderline High:  200 - 239 mg/dL          High:  > = 240 mg/dL   Triglycerides (H) 0.0 - 149.0 mg/dL    412.0 Triglyceride is over 400; calculations on Lipids are invalid.    Comment: Normal:  <150 mg/dLBorderline High:  150 - 199 mg/dL   HDL 37.10 (L) >39.00 mg/dL   Total CHOL/HDL Ratio 6     Comment:                Men          Women1/2 Average Risk     3.4          3.3Average Risk          5.0          4.42X Average Risk          9.6          7.13X Average Risk          15.0  11.0                      CBC with Differential/Platelet     Status: Abnormal   Collection Time: 08/30/14 10:00 AM  Result Value Ref Range    WBC 7.7 4.0 - 10.5 K/uL   RBC 3.77 (L) 4.22 - 5.81 Mil/uL   Hemoglobin 11.8 (L) 13.0 - 17.0 g/dL   HCT 34.0 (L) 39.0 - 52.0 %   MCV 90.2 78.0 - 100.0 fl   MCHC 34.6 30.0 - 36.0 g/dL   RDW 13.0 11.5 - 15.5 %   Platelets 276.0 150.0 - 400.0 K/uL   Neutrophils Relative % 70.0 43.0 - 77.0 %   Lymphocytes Relative 20.1 12.0 - 46.0 %   Monocytes Relative 5.9 3.0 - 12.0 %   Eosinophils Relative 3.4 0.0 - 5.0 %   Basophils Relative 0.6 0.0 - 3.0 %   Neutro Abs 5.4 1.4 - 7.7 K/uL   Lymphs Abs 1.5 0.7 - 4.0 K/uL   Monocytes Absolute 0.5 0.1 - 1.0 K/uL   Eosinophils Absolute 0.3 0.0 - 0.7 K/uL   Basophils Absolute 0.0 0.0 - 0.1 K/uL  Testosterone, Free, Total, SHBG     Status: Abnormal   Collection Time: 08/30/14 10:00 AM  Result Value Ref Range   Testosterone 90 (L) 348 - 1197 ng/dL   Comment, Testosterone Comment     Comment: Adult male reference interval is based on a population of lean males up to 77 years old.    Testosterone, Free 1.4 (L) 6.6 - 18.1 pg/mL   Sex Hormone Binding 48.7 19.3 - 76.4 nmol/L  Hemoglobin A1c     Status: Abnormal   Collection Time: 08/30/14 10:00 AM  Result Value Ref Range   Hgb A1c MFr Bld 9.2 (H) 4.6 - 6.5 %    Comment: Glycemic Control Guidelines for People with Diabetes:Non Diabetic:  <6%Goal of Therapy: <7%Additional Action Suggested:  >8%   Comprehensive metabolic panel     Status: Abnormal   Collection Time: 08/30/14 10:00 AM  Result Value Ref Range   Sodium 140 135 - 145 mEq/L   Potassium 4.0 3.5 - 5.1 mEq/L   Chloride 111 96 - 112 mEq/L   CO2 21 19 - 32 mEq/L   Glucose, Bld 242 (H) 70 - 99 mg/dL   BUN 28 (H) 6 - 23 mg/dL   Creatinine, Ser 2.19 (H) 0.40 - 1.50 mg/dL   Total Bilirubin 0.3 0.2 - 1.2 mg/dL   Alkaline Phosphatase 146 (H) 39 - 117 U/L   AST 13 0 - 37 U/L   ALT 10 0 - 53 U/L   Total Protein 6.2 6.0 - 8.3 g/dL   Albumin 3.6 3.5 - 5.2 g/dL   Calcium 8.9 8.4 - 10.5 mg/dL   GFR 31.21 (L) >60.00 mL/min  TSH     Status: None    Collection Time: 08/30/14 10:00 AM  Result Value Ref Range   TSH 1.78 0.35 - 4.50 uIU/mL  T4, free     Status: None   Collection Time: 08/30/14 10:00 AM  Result Value Ref Range   Free T4 1.02 0.60 - 1.60 ng/dL  LDL cholesterol, direct     Status: None   Collection Time: 08/30/14 10:00 AM  Result Value Ref Range   Direct LDL 131.0 mg/dL    Comment: Optimal:  <100 mg/dLNear or Above Optimal:  100-129 mg/dLBorderline High:  130-159 mg/dLHigh:  160-189 mg/dLVery High:  >190 mg/dL  Specimen status report     Status: None (Preliminary result)   Collection Time: 08/30/14 10:00 AM  Result Value Ref Range   specimen status report Comment     Comment: Specimen Inquiry Specimen Inquiry   Basic metabolic panel     Status: Abnormal   Collection Time: 09/27/14 10:05 AM  Result Value Ref Range   Sodium 140 135 - 145 mEq/L   Potassium 3.6 3.5 - 5.1 mEq/L   Chloride 106 96 - 112 mEq/L   CO2 24 19 - 32 mEq/L   Glucose, Bld 263 (H) 70 - 99 mg/dL   BUN 28 (H) 6 - 23 mg/dL   Creatinine, Ser 2.22 (H) 0.40 - 1.50 mg/dL   Calcium 8.7 8.4 - 10.5 mg/dL   GFR 30.71 (L) >60.00 mL/min  Lipid panel     Status: Abnormal   Collection Time: 09/27/14 10:05 AM  Result Value Ref Range   Cholesterol 255 (H) 0 - 200 mg/dL    Comment: ATP III Classification       Desirable:  < 200 mg/dL               Borderline High:  200 - 239 mg/dL          High:  > = 240 mg/dL   Triglycerides (H) 0.0 - 149.0 mg/dL    409.0 Triglyceride is over 400; calculations on Lipids are invalid.    Comment: Normal:  <150 mg/dLBorderline High:  150 - 199 mg/dL   HDL 31.30 (L) >39.00 mg/dL   Total CHOL/HDL Ratio 8     Comment:                Men          Women1/2 Average Risk     3.4          3.3Average Risk          5.0          4.42X Average Risk          9.6          7.13X Average Risk          15.0          11.0                      Fructosamine     Status: Abnormal   Collection Time: 09/27/14 10:05 AM  Result Value Ref Range    Fructosamine 316 (H) 0 - 285 umol/L    Comment: Published reference interval for apparently healthy subjects between age 52 and 47 is 80 - 285 umol/L and in a poorly controlled diabetic population is 228 - 563 umol/L with a mean of 396 umol/L.   LDL cholesterol, direct     Status: None   Collection Time: 09/27/14 10:05 AM  Result Value Ref Range   Direct LDL 169.0 mg/dL    Comment: Optimal:  <100 mg/dLNear or Above Optimal:  100-129 mg/dLBorderline High:  130-159 mg/dLHigh:  160-189 mg/dLVery High:  >190 mg/dL    Assessment/Plan: Orthopnea Orthopnea,shortness of breath with exertion, and peripheral edema have all resolved with resumption of Lasix. We'll continue Lasix as directed. Patient to lab to obtain blood work that He did not have drawn at last visit. Continue medications as directed. Follow-up in September for diabetes follow-up. Return sooner if any symptom recurs.

## 2014-10-30 NOTE — Patient Instructions (Signed)
Please continue medications as directed. I am glad your symptoms are much improved.  Please make sure not to skip doses of your Lasix. Follow-up with Cardiology as directed.  Stop by the lab for blood work on your way out.

## 2014-10-30 NOTE — Assessment & Plan Note (Signed)
Orthopnea,shortness of breath with exertion, and peripheral edema have all resolved with resumption of Lasix. We'll continue Lasix as directed. Patient to lab to obtain blood work that He did not have drawn at last visit. Continue medications as directed. Follow-up in September for diabetes follow-up. Return sooner if any symptom recurs.

## 2014-11-08 ENCOUNTER — Telehealth: Payer: Self-pay | Admitting: Endocrinology

## 2014-11-08 NOTE — Telephone Encounter (Signed)
There was no answer on cb, per Dr. Ronnie Derby note he was to switch lantus to Toujeo, no changes in Novolog

## 2014-11-08 NOTE — Telephone Encounter (Signed)
Pt calling regarding the last time he saw the MD he was told he was to change the insulin to a different rx than the novolog, he is almost out so what is the change please call pt and pharmacy

## 2014-11-09 ENCOUNTER — Ambulatory Visit: Payer: Medicare Other | Admitting: Adult Health

## 2014-11-14 ENCOUNTER — Telehealth: Payer: Self-pay | Admitting: Endocrinology

## 2014-11-14 ENCOUNTER — Other Ambulatory Visit: Payer: Self-pay | Admitting: *Deleted

## 2014-11-14 MED ORDER — INSULIN GLARGINE 300 UNIT/ML ~~LOC~~ SOPN: 60.0000 [IU] | PEN_INJECTOR | Freq: Every day | SUBCUTANEOUS | Status: DC

## 2014-11-14 MED ORDER — INSULIN ASPART 100 UNIT/ML ~~LOC~~ SOLN: 8.0000 [IU] | Freq: Three times a day (TID) | SUBCUTANEOUS | Status: DC

## 2014-11-14 NOTE — Telephone Encounter (Signed)
Please call toujeo and the novolog to [facility] [phone] he is almost out

## 2014-11-14 NOTE — Telephone Encounter (Signed)
rx's were sent to [facility].

## 2014-11-15 ENCOUNTER — Other Ambulatory Visit: Payer: Self-pay | Admitting: Endocrinology

## 2014-11-20 ENCOUNTER — Ambulatory Visit (INDEPENDENT_AMBULATORY_CARE_PROVIDER_SITE_OTHER): Payer: 59 | Admitting: Psychology

## 2014-11-20 DIAGNOSIS — F331 Major depressive disorder, recurrent, moderate: Secondary | ICD-10-CM | POA: Diagnosis not present

## 2014-11-23 ENCOUNTER — Encounter: Payer: Self-pay | Admitting: Adult Health

## 2014-11-23 ENCOUNTER — Ambulatory Visit (INDEPENDENT_AMBULATORY_CARE_PROVIDER_SITE_OTHER): Payer: Medicare Other | Admitting: Adult Health

## 2014-11-23 VITALS — BP 119/70 | HR 92 | Ht 72.0 in | Wt 226.0 lb

## 2014-11-23 DIAGNOSIS — J449 Chronic obstructive pulmonary disease, unspecified: Secondary | ICD-10-CM | POA: Diagnosis not present

## 2014-11-23 DIAGNOSIS — R0902 Hypoxemia: Secondary | ICD-10-CM

## 2014-11-23 NOTE — Assessment & Plan Note (Signed)
Compensated on O2 Need light weight POC   Plan   Continue on Oxygen 2l/m  Will send order for portable concentrator.  follow up Dr. Elsworth Soho  In 3 months and As needed

## 2014-11-23 NOTE — Progress Notes (Signed)
Subjective:    Patient ID: Ricky Baker, male    DOB: Jan 17, 1936, 79 y.o.   MRN: [id number]  HPI 79 year old heavy ex-smoker with  COPD. And chronic resp failure on O2 .  Followed by Dr. Elsworth Soho  , seen for pulmonary consult 09/21/14  IOV 09/21/14  He has moved from [place] to be closer to her sister's due to his failing health. He smoked about 40 pack years before he quit in 2004. He is maintained on a regimen of breo, Spiriva, DuoNeb's and Daliresp-by his pulmonologist in [place]. He has been maintained on oxygen for 3 years-he uses portable oxygen only as needed and wonders if he needs a portable concentrator. He has a history of falls, was hospitalized for 5 days with broken ribs and underwent rehabilitation, now walks with a walker. He did pulmonary rehabilitation for 5 years. He has insulin requiring diabetes, and occasionally has low sugars. He is atrial fibrillation on amiodarone and apixaban He has congestive heart failure- and takes Lasix as needed. He had esophageal cancer in 2006 status post gastric pull-up surgery at [facility]  11/23/2014 Follow up : COPD and chronic O2 dependent Pt returns for 2 month follow up  Says overall his breathing is doing okay , feels he is at his baseline.  PVX and Prevnar are utd.  Requests order for portable concentrator that is not so heavy.  On O2 at 2l/m 24/7 .  Remains on BREO, Spiriva and Daliresp.  Previously did pulm rehab. Says he trys to be active at home Has New Step that he is going to use more.  Denies chest pain, orthopnea, edema or fever.    Significant tests/ events  echo 08/2014 >> nml LVEF, mod AS , AVA 1.1 Spirometry  09/2014 >> severe airway obstruction, FEV1 47%, ratio 63, FVC 56% 09/2014 >> desat to 78% on walking in the office.   Past Medical History  Diagnosis Date  . COPD (chronic obstructive pulmonary disease)   . CHF (congestive heart failure)   . Thyroid disease   . Chronic bronchitis   . Atrial fibrillation     . CKD (chronic kidney disease)   . Diabetes mellitus without complication   . Allergy   . Cancer     esophogeal   . CKD (chronic kidney disease)   . Depression   . Hypertension     Past Surgical History  Procedure Laterality Date  . Esophagus surgery  2006  . Spine surgery      bone spurs  . Cardiac catheterization  2009    two stents placed  . Tonsillectomy      Allergies  Allergen Reactions  . Dust Mite Extract Other (See Comments)    Runny nose   . Pollen Extract Other (See Comments)    Runny nose    Social History   Social History  . Marital Status: Single    Spouse Name: N/A  . Number of Children: N/A  . Years of Education: N/A   Occupational History  . Not on file.   Social History Main Topics  . Smoking status: Former Smoker -- 1.00 packs/day for 42 years    Types: Cigarettes    Quit date: 04/07/2001  . Smokeless tobacco: Not on file  . Alcohol Use: No  . Drug Use: Not on file  . Sexual Activity: Not on file   Other Topics Concern  . Not on file   Social History Narrative     Review of Systems  neg for any significant sore throat, dysphagia, itching, sneezing, nasal congestion or excess/ purulent secretions, fever, chills, sweats, unintended wt loss, pleuritic or exertional cp, hempoptysis, orthopnea pnd or change in chronic leg swelling. Also denies presyncope, palpitations, heartburn, abdominal pain, nausea, vomiting, diarrhea or change in bowel or urinary habits, dysuria,hematuria, rash, arthralgias, visual complaints, headache, numbness weakness or ataxia.     Objective:   Physical Exam  Gen. Pleasant, well-nourished, in no distress, elderly, normal affect, walks with walker  ENT - no lesions, no post nasal drip Neck: No JVD, no thyromegaly, no carotid bruits Lungs: no use of accessory muscles, no dullness to percussion, decreased without rales or rhonchi  Cardiovascular: Rhythm regular, heart sounds  normal, no murmurs or gallops, 1+  peripheral edema Abdomen: soft and non-tender, no hepatosplenomegaly, BS normal. Musculoskeletal: No deformities, no cyanosis or clubbing Neuro:  alert, non focal       Assessment & Plan:

## 2014-11-23 NOTE — Patient Instructions (Addendum)
Continue  on Breo & spiriva & daliresp Continue on Oxygen 2l/m  Will send order for portable concentrator.  follow up Dr. Elsworth Soho  In 3 months and As needed

## 2014-11-23 NOTE — Addendum Note (Signed)
Addended by: Mathis Dad on: 11/23/2014 12:43 PM   Modules accepted: Orders

## 2014-11-23 NOTE — Assessment & Plan Note (Signed)
Compensated on present regimen   Plan  Continue  on Breo & spiriva & daliresp Continue on Oxygen 2l/m  Will send order for portable concentrator.  follow up Dr. Elsworth Soho  In 3 months and As needed

## 2014-11-27 NOTE — Progress Notes (Signed)
Reviewed & agree with plan  

## 2014-11-29 ENCOUNTER — Other Ambulatory Visit: Payer: Medicare Other

## 2014-12-04 ENCOUNTER — Ambulatory Visit (INDEPENDENT_AMBULATORY_CARE_PROVIDER_SITE_OTHER): Payer: Medicare Other | Admitting: Endocrinology

## 2014-12-04 VITALS — Ht 72.0 in | Wt 230.6 lb

## 2014-12-04 DIAGNOSIS — E1165 Type 2 diabetes mellitus with hyperglycemia: Secondary | ICD-10-CM

## 2014-12-04 DIAGNOSIS — E291 Testicular hypofunction: Secondary | ICD-10-CM

## 2014-12-04 DIAGNOSIS — N189 Chronic kidney disease, unspecified: Secondary | ICD-10-CM | POA: Diagnosis not present

## 2014-12-04 DIAGNOSIS — E785 Hyperlipidemia, unspecified: Secondary | ICD-10-CM | POA: Diagnosis not present

## 2014-12-04 DIAGNOSIS — IMO0002 Reserved for concepts with insufficient information to code with codable children: Secondary | ICD-10-CM

## 2014-12-04 LAB — LIPID PANEL
CHOL/HDL RATIO: 4
Cholesterol: 134 mg/dL (ref 0–200)
HDL: 34.9 mg/dL — AB (ref >39.00)
LDL CALC: 62 mg/dL (ref 0–99)
NONHDL: 99.17
Triglycerides: 186 mg/dL — ABNORMAL HIGH (ref 0.0–149.0)
VLDL: 37.2 mg/dL (ref 0.0–40.0)

## 2014-12-04 LAB — BASIC METABOLIC PANEL
BUN: 32 mg/dL — AB (ref 6–23)
CALCIUM: 8.8 mg/dL (ref 8.4–10.5)
CO2: 27 meq/L (ref 19–32)
Chloride: 105 mEq/L (ref 96–112)
Creatinine, Ser: 2.73 mg/dL — ABNORMAL HIGH (ref 0.40–1.50)
GFR: 24.18 mL/min — AB (ref >60.00)
GLUCOSE: 158 mg/dL — AB (ref 70–99)
Potassium: 4 mEq/L (ref 3.5–5.1)
SODIUM: 141 meq/L (ref 135–145)

## 2014-12-04 LAB — HEMOGLOBIN A1C: Hgb A1c MFr Bld: 6.6 % — ABNORMAL HIGH (ref 4.6–6.5)

## 2014-12-04 LAB — TESTOSTERONE: Testosterone: 278.95 ng/dL — ABNORMAL LOW (ref 300.00–890.00)

## 2014-12-04 NOTE — Progress Notes (Signed)
Patient ID: Ricky Baker, male   DOB: 1935/11/08, 79 y.o.   MRN: [id number]           Reason for Appointment: Follow-up  for Type 2 Diabetes  Referring physician: Raiford Noble  History of Present Illness:          Date of diagnosis of type 2 diabetes mellitus:?  30 years         Background history:  The patient was on oral medications for the first few years of his diagnosis, not clear what control he had at that time After about 10 years because of his problem with esophageal cancer and not being able to eat normal food he was on insulin Subsequently this was continued, partly because of his kidney function not being normal and metformin being discontinued He thinks he has been on Lantus insulin for several years along with Novolog Details of his previous control is not available but apparently it had been dependent on his compliance  Recent history:   INSULIN regimen is described as: Novolog insulin 10 at breakfast and supper, 16 at lunch.  Lantus 60 units in am     He was seen in consultation in 5/16 when he was irregular with his insulin regimen and has not been motivated to take care of his diabetes His A1c was 9.2% On his last visit he was adjusting his Lantus at night based on his blood sugar levels daily and this was changed to morning regimen With improve compliance overall and better management of his diabetes after instructions hear his A1c is now excellent at 6.6  Current blood sugar patterns and problems identified:    He is checking his blood sugars fairly regularly up to 3 times a day now  Lantus insulin: He has tried to be taking this consistently at the same dose every morning  Fasting blood sugars: Although these are slightly variable they are overall excellent but also has had a couple of readings in the upper 60s.  He thinks they are higher in the morning if he is not consistent with carbohydrate intake the night befor  No overnight hypoglycemia with only  one low normal blood sugar  He does not reduce his insulin enough if he is eating smaller portions and will tend to get some hypoglycemia periodically  Blood sugars after her evening meal being checked more often and are usually fairly good  Postprandial readings after supper are fairly good although has some variability and only rarely over 200  Hypoglycemia: As above, less recently with only one blood sugar below 65 and this was after lunch, reading 42  Oral hypoglycemic drugs the patient is taking are:   none     Compliance with the medical regimen: Fair to poor  Glucose monitoring:  done 1 up to 3 times a day on an average         Glucometer:  Accu-Chek Blood Glucose readings by time of day and averages from meter download:  Mean values apply above for all meters except median for One Touch  PRE-MEAL Fasting Lunch Dinner Bedtime Overall  Glucose range:  68-197     99-182    Mean/median:  119  121   140   126    POST-MEAL PC Breakfast PC Lunch PC Dinner  Glucose range:     Mean/median:      Self-care: The diet that the patient has been following is: None.  Eating out at restaurants about 4-5 times a week,  usually at fast food places Meal times: Breakfast: 9 AM Lunch: 1 pm Dinner: 6pm  Typical meal intake: Breakfast is cereal              Dietician visit, most recent: A few years ago               Exercise:  none, unable to do much activity because of his multiple medical problems and balance issues   Weight history: He has gradually lost about 40 pounds over the last year and a half   Wt Readings from Last 3 Encounters:  12/04/14 230 lb 9.6 oz (104.599 kg)  11/23/14 226 lb (102.513 kg)  10/30/14 226 lb 3.2 oz (102.604 kg)   Glycemic control: Last A1c was 8% or more in March    Lab Results  Component Value Date   HGBA1C 6.6* 12/04/2014   HGBA1C 9.2* 08/30/2014   Lab Results  Component Value Date   LDLCALC 62 12/04/2014   CREATININE 2.73* 12/04/2014    OTHER  medical problems are discussed in review of systems      Medication List       This list is accurate as of: 12/04/14  9:20 PM.  Always use your most recent med list.               ACCU-CHEK AVIVA PLUS test strip  Generic drug:  glucose blood  TEST AS DIRECTED THREE TIMES DAILY     amiodarone 200 MG tablet  Commonly known as:  PACERONE  Take 200 mg by mouth 2 (two) times daily.     apixaban 5 MG Tabs tablet  Commonly known as:  ELIQUIS  Take 5 mg by mouth 2 (two) times daily.     atorvastatin 80 MG tablet  Commonly known as:  LIPITOR  Take 1 tablet (80 mg total) by mouth daily.     BREO ELLIPTA IN  Inhale 1 puff into the lungs.     buPROPion 300 MG 24 hr tablet  Commonly known as:  WELLBUTRIN XL  Take 300 mg by mouth daily.     diltiazem 240 MG 24 hr capsule  Commonly known as:  DILACOR XR  Take 240 mg by mouth daily.     doxercalciferol 2.5 MCG capsule  Commonly known as:  HECTOROL  Take 2.5 mcg by mouth daily.     DULoxetine 60 MG capsule  Commonly known as:  CYMBALTA  Take 1 capsule (60 mg total) by mouth daily.     furosemide 40 MG tablet  Commonly known as:  LASIX  Take 40-80 mg by mouth daily.     gabapentin 300 MG capsule  Commonly known as:  NEURONTIN  TAKE 2 CAPSULES BY MOUTH THREE TIMES DAILY     HYDROcodone-acetaminophen 5-325 MG per tablet  Commonly known as:  NORCO/VICODIN  Take 5-325 tablets by mouth as needed.     Insulin Glargine 300 UNIT/ML Sopn  Commonly known as:  TOUJEO SOLOSTAR  Inject 60 Units into the skin at bedtime.     ipratropium-albuterol 0.5-2.5 (3) MG/3ML Soln  Commonly known as:  DUONEB  Take 3 mLs by nebulization.     levothyroxine 112 MCG tablet  Commonly known as:  SYNTHROID, LEVOTHROID  Take 112 mcg by mouth daily before breakfast.     montelukast 10 MG tablet  Commonly known as:  SINGULAIR  Take 10 mg by mouth at bedtime.     nebivolol 10 MG tablet  Commonly known as:  BYSTOLIC  Take 10 mg by mouth daily.      NOVOLOG FLEXPEN [place]  Inject into the skin. 01-20-09     NOVOLOG FLEXPEN 100 UNIT/ML FlexPen  Generic drug:  insulin aspart  INJECT 20 UNITS THREE TIMES DAILY AS DIRECTED     nystatin ointment  Commonly known as:  MYCOSTATIN  Apply 1 application topically 2 (two) times daily.     olopatadine 0.1 % ophthalmic solution  Commonly known as:  PATANOL  Place 1 drop into the left eye 2 (two) times daily as needed.     OXYGEN  Inhale into the lungs. 2L all day and night     ranolazine 500 MG 12 hr tablet  Commonly known as:  RANEXA  Take 500 mg by mouth 2 (two) times daily.     roflumilast 500 MCG Tabs tablet  Commonly known as:  DALIRESP  Take 500 mcg by mouth daily.     spironolactone 25 MG tablet  Commonly known as:  ALDACTONE  Take 25 mg by mouth daily.     testosterone 50 MG/5GM (1%) Gel  Commonly known as:  ANDROGEL  Place 5 g onto the skin daily. Uses 6 pumps daily     tiotropium 18 MCG inhalation capsule  Commonly known as:  SPIRIVA  Place 18 mcg into inhaler and inhale daily.     VITAMIN D2 PO  Take 50,000 Units by mouth every 30 (thirty) days.        Allergies:  Allergies  Allergen Reactions  . Dust Mite Extract Other (See Comments)    Runny nose   . Pollen Extract Other (See Comments)    Runny nose    Past Medical History  Diagnosis Date  . COPD (chronic obstructive pulmonary disease)   . CHF (congestive heart failure)   . Thyroid disease   . Chronic bronchitis   . Atrial fibrillation   . CKD (chronic kidney disease)   . Diabetes mellitus without complication   . Allergy   . Cancer     esophogeal   . CKD (chronic kidney disease)   . Depression   . Hypertension     Past Surgical History  Procedure Laterality Date  . Esophagus surgery  2006  . Spine surgery      bone spurs  . Cardiac catheterization  2009    two stents placed  . Tonsillectomy      Family History  Problem Relation Age of Onset  . COPD Father   . Diabetes Neg Hx       Social History:  reports that he quit smoking about 13 years ago. His smoking use included Cigarettes. He has a 42 pack-year smoking history. He does not have any smokeless tobacco history on file. He reports that he does not drink alcohol. His drug history is not on file.    Review of Systems    Lipid history: On Lipitor for several years, triglycerides and LDL have been high previously He was told on his last visit to resume taking Lipitor    Lab Results  Component Value Date   CHOL 134 12/04/2014   HDL 34.90* 12/04/2014   LDLCALC 62 12/04/2014   LDLDIRECT 169.0 09/27/2014   TRIG 186.0* 12/04/2014   CHOLHDL 4 12/04/2014            Eyes:   Most recent eye exam was 5/16 and reportedly normal, he saw an optometrist in [place]  Hypertension: Present for several years and treated with multiple drugs including  diltiazem, Bystolic and Aldactone  Neurological:   Has long-standing numbness and pains or tingling in feet controlled with gabapentin  Foot exam in 5/16 showed the following: Pedal pulses absent, monofilament sensation absent except mild sensation present in the left distal toes  Psychiatric:  Has symptoms of depression and appears subjectively much better now with taking Cymbalta   Endocrine: No unusual fatigue, cold intolerance  He has a history of thyroid disease for about 52 yrs and taking thyroid supplements, previously followed by endocrinologist.   He has been on the same dose for about 2 years and last TSH normal  Lab Results  Component Value Date   TSH 1.78 08/30/2014   FREET4 1.02 08/30/2014    HYPOGONADISM: He was treated by a urologist for about 6 years, previously was told to have hypogonadism when he was complaining of feeling lethargic and decreased libido.  With not taking AndroGel his level is only 90 and he has started back taking 6 pumps a of the AndroGel as directed He feels better with more motivation and less fatigue  Lab Results   Component Value Date   TESTOSTERONE 278.95* 12/04/2014    CKD: Previously followed by nephrologist and has not established with a new one hair      Office Visit on 12/04/2014  Component Date Value Ref Range Status  . Hgb A1c MFr Bld 12/04/2014 6.6* 4.6 - 6.5 % Final   Glycemic Control Guidelines for People with Diabetes:Non Diabetic:  <6%Goal of Therapy: <7%Additional Action Suggested:  >8%   . Sodium 12/04/2014 141  135 - 145 mEq/L Final  . Potassium 12/04/2014 4.0  3.5 - 5.1 mEq/L Final  . Chloride 12/04/2014 105  96 - 112 mEq/L Final  . CO2 12/04/2014 27  19 - 32 mEq/L Final  . Glucose, Bld 12/04/2014 158* 70 - 99 mg/dL Final  . BUN 12/04/2014 32* 6 - 23 mg/dL Final  . Creatinine, Ser 12/04/2014 2.73* 0.40 - 1.50 mg/dL Final  . Calcium 12/04/2014 8.8  8.4 - 10.5 mg/dL Final  . GFR 12/04/2014 24.18* >60.00 mL/min Final  . Testosterone 12/04/2014 278.95* 300.00 - 890.00 ng/dL Final  . Cholesterol 12/04/2014 134  0 - 200 mg/dL Final   ATP III Classification       Desirable:  < 200 mg/dL               Borderline High:  200 - 239 mg/dL          High:  > = 240 mg/dL  . Triglycerides 12/04/2014 186.0* 0.0 - 149.0 mg/dL Final   Normal:  <150 mg/dLBorderline High:  150 - 199 mg/dL  . HDL 12/04/2014 34.90* >39.00 mg/dL Final  . VLDL 12/04/2014 37.2  0.0 - 40.0 mg/dL Final  . LDL Cholesterol 12/04/2014 62  0 - 99 mg/dL Final  . Total CHOL/HDL Ratio 12/04/2014 4   Final                  Men          Women1/2 Average Risk     3.4          3.3Average Risk          5.0          4.42X Average Risk          9.6          7.13X Average Risk          15.0  11.0                      . NonHDL 12/04/2014 99.17   Final   NOTE:  Non-HDL goal should be 30 mg/dL higher than patient's LDL goal (i.e. LDL goal of < 70 mg/dL, would have non-HDL goal of < 100 mg/dL)    Physical Examination:  Ht 6' (1.829 m)  Wt 230 lb 9.6 oz (104.599 kg)  BMI 31.27 kg/m2   ASSESSMENT/PLAN :   Diabetes type  2, uncontrolled   See history of present illness for detailed discussion of his current management, blood sugar patterns and problems identified He does have  muchbetter control of his sugars since his last visit after encouraging him to be more compliant  all aspects of self-care His motivation level is also much better and he is understanding how to adjust his basal and bolus insulin better now  He is also doing better overall with taking Lantus in the morning instead of at night  He is still trying to learn how to adjust his mealtime dose based on blood sugars and meal size but has not been able to avoid hypoglycemia consistently when he is eating less Still taking relatively lower doses of mealtime insulin  Again discussed with him how to adjust mealtime doses based on carbohydrate intake and pre-meal blood sugar, he can reduce his insulin at mealtimes but not skip it altogether Also advised him on adjusting Lantus based on fasting blood sugar trend   HYPOGONADISM: He had marked decrease in testosterone levels and this is likely to be from metabolic syndrome.  He is subjectively feeling much better with starting testosterone supplement and will check his level today He is currently taking relatively high dose of 6 pumps a of AndroGel  Consider using the 1.62%  HYPERLIPIDEMIA: With his history of coronary artery disease he does need LDL to be below 70 and he can continue taking 80 mg Lipitor   Patient Instructions  Adjust Novolog Based on carbs and meal size  Reduce Lantus to 56 in am if fasting stays <100   Counseling time on subjects discussed above is over 50% of today's 25 minute visit  Assia Meanor 12/04/2014, 9:20 PM   Note: This office note was prepared with Estate agent. Any transcriptional errors that result from this process are unintentional.  Labs: A1c is excellent at 6.6 Testosterone level is better but slightly low; recommend next  prescription to be AndroGel 1.62% instead of 1%, stay with 6 pumps a day Cholesterol is under good level, continue same doses Kidney test appears worse, recommend he talk to PCP about nephrology consultation  ______

## 2014-12-04 NOTE — Progress Notes (Signed)
Quick Note:  Please let patient know that: A1c is excellent at 6.6 Testosterone level is better but slightly low; recommend next prescription to be AndroGel 1.62% instead of 1%, stay with 6 pumps a day Cholesterol is under good level, continue same doses Kidney test appears worse, recommend he talk to PCP about nephrology consultation  ______

## 2014-12-04 NOTE — Patient Instructions (Signed)
Adjust Novolog Based on carbs and meal size  Reduce Lantus to 56 in am if fasting stays <100

## 2014-12-05 ENCOUNTER — Other Ambulatory Visit: Payer: Self-pay | Admitting: *Deleted

## 2014-12-05 MED ORDER — TESTOSTERONE 20.25 MG/1.25GM (1.62%) TD GEL: TRANSDERMAL | Status: DC

## 2014-12-08 ENCOUNTER — Telehealth: Payer: Self-pay | Admitting: *Deleted

## 2014-12-08 NOTE — Telephone Encounter (Signed)
Patients Androgel has been approved through March 1st 2017

## 2014-12-13 ENCOUNTER — Other Ambulatory Visit: Payer: Self-pay | Admitting: Physician Assistant

## 2014-12-13 NOTE — Telephone Encounter (Signed)
Medication Detail      Disp Refills Start End     DULoxetine (CYMBALTA) 60 MG capsule 90 capsule 1 08/14/2014     Sig - Route: Take 1 capsule (60 mg total) by mouth daily. - Oral    E-Prescribing Status: Receipt confirmed by pharmacy (08/14/2014 9:41 AM EDT)     Associated Diagnoses    Major depressive disorder, recurrent episode, moderate       Pharmacy    [id number], [place]   Patient has requested refill at new pharmacy; called [facility] to verify that refill due in August had not been filled & to cancel; Refill not sent to [facility] d/t contraindication warning/SLS Please Advise on refills/SLS  Dose Warning Report DULoxetine, Oral Significance: Contraindicated The medication is contraindicated  Patient Information Sex: Male Age: 79 y.o. (28,047 days) Weight: 104.6 kg [Weight as of Mon Dec 04, 2014 1325] Creatinine Clearance: 28 mL/min (Serum Creatinine: 2.73 mg/dL) Diagnoses used to match Medical Conditions: Major depressive disorder, recurrent episode, moderate; Chronic right shoulder pain; Diabetic peripheral neuropathy associated with type 2 diabetes mellitus  Other Information Ordering Mode: Outpatient Route: Oral and equivalent

## 2014-12-14 ENCOUNTER — Other Ambulatory Visit: Payer: Self-pay | Admitting: *Deleted

## 2014-12-14 ENCOUNTER — Telehealth: Payer: Self-pay | Admitting: Physician Assistant

## 2014-12-14 MED ORDER — "INSULIN SYRINGE-NEEDLE U-100 31G X 5/16"" 0.3 ML MISC": Status: AC

## 2014-12-14 NOTE — Telephone Encounter (Signed)
Attempted to call patient x 2.  No answer.  Unable to leave a voice message.

## 2014-12-14 NOTE — Telephone Encounter (Signed)
Received refill request for Cymbalta. Reviewing recent visit with Dr. Dwyane Dee, kidney function has worsened. Cymbalta may no longer be safe for patient to use as his kidneys are not filtering out the medication as well which can cause elevated Cymbalta levels which affects his central nervous system.  Two things -- first I am setting him up to see a kidney specialist. Referral has been placed. Secondly he needs to try an wean off the Cymbalta taking every other day instead of daily for 1 week. Then every 3 days x 1 week before stopping. Continue the Gabapentin as directed for neuropathic pain. Continue Wellbutrin as directed for depressive disorder. Follow-up with me in 2 weeks. If mood is worsening as we wean off the cymbalta he is to call me.  The nephrologist will help [place] decide on restarting the Cymbalta at a safe dose.  * If he is completely out we can send in short course of Cymbalta to last him through the weaning process.

## 2014-12-15 ENCOUNTER — Ambulatory Visit: Payer: 59 | Admitting: Psychology

## 2014-12-18 NOTE — Telephone Encounter (Signed)
FYI: Patient informed, states he sees Dr. Cletus Gash at [facility] and will have them fax over [place] notes from visit last week w/labs and his kidney function was worse. Pt wanted to let you know also that his A1C was 6.6, he is very happy about this. He will contact us if there is any problem with the weaning of the Cymbalta and I have scheduled patient for Medicare Wellness in 2-wks on 09.26.16/SLS

## 2014-12-18 NOTE — Telephone Encounter (Signed)
Noted  

## 2014-12-18 NOTE — Telephone Encounter (Signed)
Please attempt to reach patient again. Thank you

## 2014-12-27 ENCOUNTER — Ambulatory Visit (INDEPENDENT_AMBULATORY_CARE_PROVIDER_SITE_OTHER): Payer: 59 | Admitting: Psychology

## 2014-12-27 DIAGNOSIS — F331 Major depressive disorder, recurrent, moderate: Secondary | ICD-10-CM | POA: Diagnosis not present

## 2015-01-01 ENCOUNTER — Ambulatory Visit (INDEPENDENT_AMBULATORY_CARE_PROVIDER_SITE_OTHER): Payer: Medicare Other | Admitting: Physician Assistant

## 2015-01-01 ENCOUNTER — Encounter: Payer: Self-pay | Admitting: Physician Assistant

## 2015-01-01 VITALS — BP 133/72 | HR 94 | Temp 98.1°F | Resp 16 | Ht 72.0 in | Wt 231.0 lb

## 2015-01-01 DIAGNOSIS — Z0000 Encounter for general adult medical examination without abnormal findings: Secondary | ICD-10-CM | POA: Diagnosis not present

## 2015-01-01 DIAGNOSIS — F331 Major depressive disorder, recurrent, moderate: Secondary | ICD-10-CM | POA: Diagnosis not present

## 2015-01-01 DIAGNOSIS — E1165 Type 2 diabetes mellitus with hyperglycemia: Secondary | ICD-10-CM

## 2015-01-01 DIAGNOSIS — G629 Polyneuropathy, unspecified: Secondary | ICD-10-CM

## 2015-01-01 DIAGNOSIS — I480 Paroxysmal atrial fibrillation: Secondary | ICD-10-CM

## 2015-01-01 DIAGNOSIS — IMO0002 Reserved for concepts with insufficient information to code with codable children: Secondary | ICD-10-CM

## 2015-01-01 DIAGNOSIS — J449 Chronic obstructive pulmonary disease, unspecified: Secondary | ICD-10-CM

## 2015-01-01 DIAGNOSIS — Z136 Encounter for screening for cardiovascular disorders: Secondary | ICD-10-CM

## 2015-01-01 DIAGNOSIS — E1142 Type 2 diabetes mellitus with diabetic polyneuropathy: Secondary | ICD-10-CM

## 2015-01-01 DIAGNOSIS — I10 Essential (primary) hypertension: Secondary | ICD-10-CM

## 2015-01-01 MED ORDER — OLOPATADINE HCL 0.1 % OP SOLN: 1.0000 [drp] | Freq: Two times a day (BID) | OPHTHALMIC | Status: DC | PRN

## 2015-01-01 MED ORDER — BUPROPION HCL ER (XL) 300 MG PO TB24: 300.0000 mg | ORAL_TABLET | Freq: Every day | ORAL | Status: DC

## 2015-01-01 NOTE — Progress Notes (Signed)
Pre visit review using our clinic review tool, if applicable. No additional management support is needed unless otherwise documented below in the visit note/SLS  

## 2015-01-01 NOTE — Patient Instructions (Signed)
Please continue medications as directed. Follow-up with your specialists and Terri as directed for counseling sessions.  Follow-up with me in 6 months for depression and anxiety. No need for labs today as your Endocrinologist just checked all of your blood work and I have reviewed them.  Preventive Care for Adults A healthy lifestyle and preventive care can promote health and wellness. Preventive health guidelines for men include the following key practices:  A routine yearly physical is a good way to check with your health care provider about your health and preventative screening. It is a chance to share any concerns and updates on your health and to receive a thorough exam.  Visit your dentist for a routine exam and preventative care every 6 months. Brush your teeth twice a day and floss once a day. Good oral hygiene prevents tooth decay and gum disease.  The frequency of eye exams is based on your age, health, family medical history, use of contact lenses, and other factors. Follow your health care provider's recommendations for frequency of eye exams.  Eat a healthy diet. Foods such as vegetables, fruits, whole grains, low-fat dairy products, and lean protein foods contain the nutrients you need without too many calories. Decrease your intake of foods high in solid fats, added sugars, and salt. Eat the right amount of calories for you.Get information about a proper diet from your health care provider, if necessary.  Regular physical exercise is one of the most important things you can do for your health. Most adults should get at least 150 minutes of moderate-intensity exercise (any activity that increases your heart rate and causes you to sweat) each week. In addition, most adults need muscle-strengthening exercises on 2 or more days a week.  Maintain a healthy weight. The body mass index (BMI) is a screening tool to identify possible weight problems. It provides an estimate of body fat  based on height and weight. Your health care provider can find your BMI and can help you achieve or maintain a healthy weight.For adults 20 years and older:  A BMI below 18.5 is considered underweight.  A BMI of 18.5 to 24.9 is normal.  A BMI of 25 to 29.9 is considered overweight.  A BMI of 30 and above is considered obese.  Maintain normal blood lipids and cholesterol levels by exercising and minimizing your intake of saturated fat. Eat a balanced diet with plenty of fruit and vegetables. Blood tests for lipids and cholesterol should begin at age 76 and be repeated every 5 years. If your lipid or cholesterol levels are high, you are over 50, or you are at high risk for heart disease, you may need your cholesterol levels checked more frequently.Ongoing high lipid and cholesterol levels should be treated with medicines if diet and exercise are not working.  If you smoke, find out from your health care provider how to quit. If you do not use tobacco, do not start.  Lung cancer screening is recommended for adults aged 94-80 years who are at high risk for developing lung cancer because of a history of smoking. A yearly low-dose CT scan of the lungs is recommended for people who have at least a 30-pack-year history of smoking and are a current smoker or have quit within the past 15 years. A pack year of smoking is smoking an average of 1 pack of cigarettes a day for 1 year (for example: 1 pack a day for 30 years or 2 packs a day for 15 years).  Yearly screening should continue until the smoker has stopped smoking for at least 15 years. Yearly screening should be stopped for people who develop a health problem that would prevent them from having lung cancer treatment.  If you choose to drink alcohol, do not have more than 2 drinks per day. One drink is considered to be 12 ounces (355 mL) of beer, 5 ounces (148 mL) of wine, or 1.5 ounces (44 mL) of liquor.  Avoid use of street drugs. Do not share  needles with anyone. Ask for help if you need support or instructions about stopping the use of drugs.  High blood pressure causes heart disease and increases the risk of stroke. Your blood pressure should be checked at least every 1-2 years. Ongoing high blood pressure should be treated with medicines, if weight loss and exercise are not effective.  If you are 36-37 years old, ask your health care provider if you should take aspirin to prevent heart disease.  Diabetes screening involves taking a blood sample to check your fasting blood sugar level. This should be done once every 3 years, after age 49, if you are within normal weight and without risk factors for diabetes. Testing should be considered at a younger age or be carried out more frequently if you are overweight and have at least 1 risk factor for diabetes.  Colorectal cancer can be detected and often prevented. Most routine colorectal cancer screening begins at the age of 78 and continues through age 2. However, your health care provider may recommend screening at an earlier age if you have risk factors for colon cancer. On a yearly basis, your health care provider may provide home test kits to check for hidden blood in the stool. Use of a small camera at the end of a tube to directly examine the colon (sigmoidoscopy or colonoscopy) can detect the earliest forms of colorectal cancer. Talk to your health care provider about this at age 45, when routine screening begins. Direct exam of the colon should be repeated every 5-10 years through age 69, unless early forms of precancerous polyps or small growths are found.  People who are at an increased risk for hepatitis B should be screened for this virus. You are considered at high risk for hepatitis B if:  You were born in a country where hepatitis B occurs often. Talk with your health care provider about which countries are considered high risk.  Your parents were born in a high-risk country  and you have not received a shot to protect against hepatitis B (hepatitis B vaccine).  You have HIV or AIDS.  You use needles to inject street drugs.  You live with, or have sex with, someone who has hepatitis B.  You are a man who has sex with other men (MSM).  You get hemodialysis treatment.  You take certain medicines for conditions such as cancer, organ transplantation, and autoimmune conditions.  Hepatitis C blood testing is recommended for all people born from [birth year] through 1965 and any individual with known risks for hepatitis C.  Practice safe sex. Use condoms and avoid high-risk sexual practices to reduce the spread of sexually transmitted infections (STIs). STIs include gonorrhea, chlamydia, syphilis, trichomonas, herpes, HPV, and human immunodeficiency virus (HIV). Herpes, HIV, and HPV are viral illnesses that have no cure. They can result in disability, cancer, and death.  If you are at risk of being infected with HIV, it is recommended that you take a prescription medicine daily to prevent  HIV infection. This is called preexposure prophylaxis (PrEP). You are considered at risk if:  You are a man who has sex with other men (MSM) and have other risk factors.  You are a heterosexual man, are sexually active, and are at increased risk for HIV infection.  You take drugs by injection.  You are sexually active with a partner who has HIV.  Talk with your health care provider about whether you are at high risk of being infected with HIV. If you choose to begin PrEP, you should first be tested for HIV. You should then be tested every 3 months for as long as you are taking PrEP.  A one-time screening for abdominal aortic aneurysm (AAA) and surgical repair of large AAAs by ultrasound are recommended for men ages 79 to 50 years who are current or former smokers.  Healthy men should no longer receive prostate-specific antigen (PSA) blood tests as part of routine cancer screening. Talk  with your health care provider about prostate cancer screening.  Testicular cancer screening is not recommended for adult males who have no symptoms. Screening includes self-exam, a health care provider exam, and other screening tests. Consult with your health care provider about any symptoms you have or any concerns you have about testicular cancer.  Use sunscreen. Apply sunscreen liberally and repeatedly throughout the day. You should seek shade when your shadow is shorter than you. Protect yourself by wearing long sleeves, pants, a wide-brimmed hat, and sunglasses year round, whenever you are outdoors.  Once a month, do a whole-body skin exam, using a mirror to look at the skin on your back. Tell your health care provider about new moles, moles that have irregular borders, moles that are larger than a pencil eraser, or moles that have changed in shape or color.  Stay current with required vaccines (immunizations).  Influenza vaccine. All adults should be immunized every year.  Tetanus, diphtheria, and acellular pertussis (Td, Tdap) vaccine. An adult who has not previously received Tdap or who does not know his vaccine status should receive 1 dose of Tdap. This initial dose should be followed by tetanus and diphtheria toxoids (Td) booster doses every 10 years. Adults with an unknown or incomplete history of completing a 3-dose immunization series with Td-containing vaccines should begin or complete a primary immunization series including a Tdap dose. Adults should receive a Td booster every 10 years.  Varicella vaccine. An adult without evidence of immunity to varicella should receive 2 doses or a second dose if he has previously received 1 dose.  Human papillomavirus (HPV) vaccine. Males aged 44-21 years who have not received the vaccine previously should receive the 3-dose series. Males aged 22-26 years may be immunized. Immunization is recommended through the age of 98 years for any male who has  sex with males and did not get any or all doses earlier. Immunization is recommended for any person with an immunocompromised condition through the age of 82 years if he did not get any or all doses earlier. During the 3-dose series, the second dose should be obtained 4-8 weeks after the first dose. The third dose should be obtained 24 weeks after the first dose and 16 weeks after the second dose.  Zoster vaccine. One dose is recommended for adults aged 19 years or older unless certain conditions are present.  Measles, mumps, and rubella (MMR) vaccine. Adults born before [birth year] generally are considered immune to measles and mumps. Adults born in [birth year] or later should have 1  or more doses of MMR vaccine unless there is a contraindication to the vaccine or there is laboratory evidence of immunity to each of the three diseases. A routine second dose of MMR vaccine should be obtained at least 28 days after the first dose for students attending postsecondary schools, health care workers, or international travelers. People who received inactivated measles vaccine or an unknown type of measles vaccine during 1963-1967 should receive 2 doses of MMR vaccine. People who received inactivated mumps vaccine or an unknown type of mumps vaccine before 1979 and are at high risk for mumps infection should consider immunization with 2 doses of MMR vaccine. Unvaccinated health care workers born before [birth year] who lack laboratory evidence of measles, mumps, or rubella immunity or laboratory confirmation of disease should consider measles and mumps immunization with 2 doses of MMR vaccine or rubella immunization with 1 dose of MMR vaccine.  Pneumococcal 13-valent conjugate (PCV13) vaccine. When indicated, a person who is uncertain of his immunization history and has no record of immunization should receive the PCV13 vaccine. An adult aged 31 years or older who has certain medical conditions and has not been previously immunized should  receive 1 dose of PCV13 vaccine. This PCV13 should be followed with a dose of pneumococcal polysaccharide (PPSV23) vaccine. The PPSV23 vaccine dose should be obtained at least 8 weeks after the dose of PCV13 vaccine. An adult aged 26 years or older who has certain medical conditions and previously received 1 or more doses of PPSV23 vaccine should receive 1 dose of PCV13. The PCV13 vaccine dose should be obtained 1 or more years after the last PPSV23 vaccine dose.  Pneumococcal polysaccharide (PPSV23) vaccine. When PCV13 is also indicated, PCV13 should be obtained first. All adults aged 6 years and older should be immunized. An adult younger than age 9 years who has certain medical conditions should be immunized. Any person who resides in a nursing home or long-term care facility should be immunized. An adult smoker should be immunized. People with an immunocompromised condition and certain other conditions should receive both PCV13 and PPSV23 vaccines. People with human immunodeficiency virus (HIV) infection should be immunized as soon as possible after diagnosis. Immunization during chemotherapy or radiation therapy should be avoided. Routine use of PPSV23 vaccine is not recommended for American Indians, [place] Natives, or people younger than 65 years unless there are medical conditions that require PPSV23 vaccine. When indicated, people who have unknown immunization and have no record of immunization should receive PPSV23 vaccine. One-time revaccination 5 years after the first dose of PPSV23 is recommended for people aged 19-64 years who have chronic kidney failure, nephrotic syndrome, asplenia, or immunocompromised conditions. People who received 1-2 doses of PPSV23 before age 89 years should receive another dose of PPSV23 vaccine at age 74 years or later if at least 5 years have passed since the previous dose. Doses of PPSV23 are not needed for people immunized with PPSV23 at or after age 30  years.  Meningococcal vaccine. Adults with asplenia or persistent complement component deficiencies should receive 2 doses of quadrivalent meningococcal conjugate (MenACWY-D) vaccine. The doses should be obtained at least 2 months apart. Microbiologists working with certain meningococcal bacteria, [employer] recruits, people at risk during an outbreak, and people who travel to or live in countries with a high rate of meningitis should be immunized. A first-year college student up through age 56 years who is living in a residence hall should receive a dose if he did not receive a dose  on or after his 16th birthday. Adults who have certain high-risk conditions should receive one or more doses of vaccine.  Hepatitis A vaccine. Adults who wish to be protected from this disease, have certain high-risk conditions, work with hepatitis A-infected animals, work in hepatitis A research labs, or travel to or work in countries with a high rate of hepatitis A should be immunized. Adults who were previously unvaccinated and who anticipate close contact with an international adoptee during the first 60 days after arrival in the [place] from a country with a high rate of hepatitis A should be immunized.  Hepatitis B vaccine. Adults should be immunized if they wish to be protected from this disease, have certain high-risk conditions, may be exposed to blood or other infectious body fluids, are household contacts or sex partners of hepatitis B positive people, are clients or workers in certain care facilities, or travel to or work in countries with a high rate of hepatitis B.  Haemophilus influenzae type b (Hib) vaccine. A previously unvaccinated person with asplenia or sickle cell disease or having a scheduled splenectomy should receive 1 dose of Hib vaccine. Regardless of previous immunization, a recipient of a hematopoietic stem cell transplant should receive a 3-dose series 6-12 months after his successful transplant.  Hib vaccine is not recommended for adults with HIV infection. Preventive Service / Frequency Ages 73 to 61  Blood pressure check.** / Every 1 to 2 years.  Lipid and cholesterol check.** / Every 5 years beginning at age 11.  Hepatitis C blood test.** / For any individual with known risks for hepatitis C.  Skin self-exam. / Monthly.  Influenza vaccine. / Every year.  Tetanus, diphtheria, and acellular pertussis (Tdap, Td) vaccine.** / Consult your health care provider. 1 dose of Td every 10 years.  Varicella vaccine.** / Consult your health care provider.  HPV vaccine. / 3 doses over 6 months, if 61 or younger.  Measles, mumps, rubella (MMR) vaccine.** / You need at least 1 dose of MMR if you were born in 1957 or later. You may also need a second dose.  Pneumococcal 13-valent conjugate (PCV13) vaccine.** / Consult your health care provider.  Pneumococcal polysaccharide (PPSV23) vaccine.** / 1 to 2 doses if you smoke cigarettes or if you have certain conditions.  Meningococcal vaccine.** / 1 dose if you are age 2 to 74 years and a Market researcher living in a residence hall, or have one of several medical conditions. You may also need additional booster doses.  Hepatitis A vaccine.** / Consult your health care provider.  Hepatitis B vaccine.** / Consult your health care provider.  Haemophilus influenzae type b (Hib) vaccine.** / Consult your health care provider. Ages 42 to 77  Blood pressure check.** / Every 1 to 2 years.  Lipid and cholesterol check.** / Every 5 years beginning at age 2.  Lung cancer screening. / Every year if you are aged 62-80 years and have a 30-pack-year history of smoking and currently smoke or have quit within the past 15 years. Yearly screening is stopped once you have quit smoking for at least 15 years or develop a health problem that would prevent you from having lung cancer treatment.  Fecal occult blood test (FOBT) of stool. / Every year  beginning at age 52 and continuing until age 66. You may not have to do this test if you get a colonoscopy every 10 years.  Flexible sigmoidoscopy** or colonoscopy.** / Every 5 years for a flexible sigmoidoscopy  or every 10 years for a colonoscopy beginning at age 93 and continuing until age 44.  Hepatitis C blood test.** / For all people born from [birth year] through 1963 and any individual with known risks for hepatitis C.  Skin self-exam. / Monthly.  Influenza vaccine. / Every year.  Tetanus, diphtheria, and acellular pertussis (Tdap/Td) vaccine.** / Consult your health care provider. 1 dose of Td every 10 years.  Varicella vaccine.** / Consult your health care provider.  Zoster vaccine.** / 1 dose for adults aged 67 years or older.  Measles, mumps, rubella (MMR) vaccine.** / You need at least 1 dose of MMR if you were born in 1957 or later. You may also need a second dose.  Pneumococcal 13-valent conjugate (PCV13) vaccine.** / Consult your health care provider.  Pneumococcal polysaccharide (PPSV23) vaccine.** / 1 to 2 doses if you smoke cigarettes or if you have certain conditions.  Meningococcal vaccine.** / Consult your health care provider.  Hepatitis A vaccine.** / Consult your health care provider.  Hepatitis B vaccine.** / Consult your health care provider.  Haemophilus influenzae type b (Hib) vaccine.** / Consult your health care provider. Ages 18 and over  Blood pressure check.** / Every 1 to 2 years.  Lipid and cholesterol check.**/ Every 5 years beginning at age 46.  Lung cancer screening. / Every year if you are aged 39-80 years and have a 30-pack-year history of smoking and currently smoke or have quit within the past 15 years. Yearly screening is stopped once you have quit smoking for at least 15 years or develop a health problem that would prevent you from having lung cancer treatment.  Fecal occult blood test (FOBT) of stool. / Every year beginning at age 40 and  continuing until age 64. You may not have to do this test if you get a colonoscopy every 10 years.  Flexible sigmoidoscopy** or colonoscopy.** / Every 5 years for a flexible sigmoidoscopy or every 10 years for a colonoscopy beginning at age 67 and continuing until age 74.  Hepatitis C blood test.** / For all people born from [birth year] through 1963 and any individual with known risks for hepatitis C.  Abdominal aortic aneurysm (AAA) screening.** / A one-time screening for ages 75 to 20 years who are current or former smokers.  Skin self-exam. / Monthly.  Influenza vaccine. / Every year.  Tetanus, diphtheria, and acellular pertussis (Tdap/Td) vaccine.** / 1 dose of Td every 10 years.  Varicella vaccine.** / Consult your health care provider.  Zoster vaccine.** / 1 dose for adults aged 40 years or older.  Pneumococcal 13-valent conjugate (PCV13) vaccine.** / Consult your health care provider.  Pneumococcal polysaccharide (PPSV23) vaccine.** / 1 dose for all adults aged 84 years and older.  Meningococcal vaccine.** / Consult your health care provider.  Hepatitis A vaccine.** / Consult your health care provider.  Hepatitis B vaccine.** / Consult your health care provider.  Haemophilus influenzae type b (Hib) vaccine.** / Consult your health care provider. **Family history and personal history of risk and conditions may change your health care provider's recommendations. Document Released: 05/20/2001 Document Revised: 03/29/2013 Document Reviewed: 08/19/2010 [facility] [address] Patient Information 2015 [place], [place]. This information is not intended to replace advice given to you by your health care provider. Make sure you discuss any questions you have with your health care provider.

## 2015-01-01 NOTE — Progress Notes (Signed)
Subjective:    Ricky Baker is a 79 y.o. male who presents for Medicare Annual/Subsequent preventive examination.   Preventive Screening-Counseling & Management  Tobacco History  Smoking status  . Former Smoker -- 1.00 packs/day for 42 years  . Types: Cigarettes  . Quit date: 04/07/2001  Smokeless tobacco  . Not on file    Problems Prior to Visit (1) Hypertension -- Endorses taking medications as directed. Patient denies chest pain, palpitations, lightheadedness, dizziness, vision changes or frequent headaches.  BP Readings from Last 3 Encounters:  01/01/15 133/72  11/23/14 119/70  10/30/14 120/70   (3) Hyperlipidemia -- Is taking Lipitor 80 mg daily. Denies myalgias. Had recent blood work with Endocrinology with Total Cholesterol at 134 and TGL 186. Is trying to watch his diet.  (4)Diabetes Mellitus II with peripheral neuropathy -- Followed by Endocrinology who is managing care. Endorses due for Ophtalmology appointment in May of 2017. Will need new referral but previous specialist was Dr. Zenia Resides with last exam 08/2014.  (5) A. Fib -- Followed by Cardiology. Is taking Amiodarone and Eliquis. Denies chest pain, palpitations or SOB.  (6) Major Depressive Disorder -- Doing well with Wellbutrin as monotherapy. States ha has good and bad days. Has started seeing counseling which is helping. Denies SI/HI  Current Problems (verified) Patient Active Problem List   Diagnosis Date Noted  . Orthopnea 10/27/2014  . Hypoxia 09/21/2014  . Chronic right shoulder pain 09/11/2014  . Type II diabetes mellitus, uncontrolled 08/30/2014  . Diabetic peripheral neuropathy associated with type 2 diabetes mellitus 08/30/2014  . HTN (hypertension) 08/16/2014  . Systolic murmur 44/06/4740  . A-fib 08/16/2014  . Acquired autoimmune hypothyroidism 08/15/2014  . COLD (chronic obstructive lung disease) 08/15/2014  . Cardiac angina 08/15/2014  . History of coronary artery disease 08/15/2014  .  Major depressive disorder, recurrent episode, moderate 08/15/2014    Medications Prior to Visit Current Outpatient Prescriptions on File Prior to Visit  Medication Sig Dispense Refill  . ACCU-CHEK AVIVA PLUS test strip TEST AS DIRECTED THREE TIMES DAILY 100 each 5  . amiodarone (PACERONE) 200 MG tablet Take 200 mg by mouth 2 (two) times daily.    Marland Kitchen apixaban (ELIQUIS) 5 MG TABS tablet Take 5 mg by mouth 2 (two) times daily.    Marland Kitchen atorvastatin (LIPITOR) 80 MG tablet Take 1 tablet (80 mg total) by mouth daily. 30 tablet 3  . diltiazem (DILACOR XR) 240 MG 24 hr capsule Take 240 mg by mouth daily.    Marland Kitchen doxercalciferol (HECTOROL) 2.5 MCG capsule Take 2.5 mcg by mouth daily.    . DULoxetine (CYMBALTA) 60 MG capsule Take 1 capsule (60 mg total) by mouth daily. 90 capsule 1  . Ergocalciferol (VITAMIN D2 PO) Take 50,000 Units by mouth every 30 (thirty) days.    . Fluticasone Furoate-Vilanterol (BREO ELLIPTA IN) Inhale 1 puff into the lungs.    . furosemide (LASIX) 40 MG tablet Take 40-80 mg by mouth daily.    Marland Kitchen gabapentin (NEURONTIN) 300 MG capsule TAKE 2 CAPSULES BY MOUTH THREE TIMES DAILY 540 capsule 0  . Insulin Aspart (NOVOLOG FLEXPEN [place]) Inject into the skin. 01-20-09    . Insulin Glargine (TOUJEO SOLOSTAR) 300 UNIT/ML SOPN Inject 60 Units into the skin at bedtime. (Patient taking differently: Inject 60 Units into the skin every morning. ) 4.5 mL 3  . Insulin Syringe-Needle U-100 (LITETOUCH INSULIN SYRINGE) 31G X 5/16" 0.3 ML MISC Use as directed 90 each 1  . ipratropium-albuterol (DUONEB) 0.5-2.5 (3) MG/3ML  SOLN Take 3 mLs by nebulization.    Marland Kitchen levothyroxine (SYNTHROID, LEVOTHROID) 112 MCG tablet Take 112 mcg by mouth daily before breakfast.    . montelukast (SINGULAIR) 10 MG tablet Take 10 mg by mouth at bedtime.    . nebivolol (BYSTOLIC) 10 MG tablet Take 10 mg by mouth daily.    Marland Kitchen NOVOLOG FLEXPEN 100 UNIT/ML FlexPen INJECT 20 UNITS THREE TIMES DAILY AS DIRECTED 15 mL 3  . nystatin ointment  (MYCOSTATIN) Apply 1 application topically 2 (two) times daily. 30 g 0  . OXYGEN Inhale into the lungs. 2L all day and night    . ranolazine (RANEXA) 500 MG 12 hr tablet Take 500 mg by mouth 2 (two) times daily.    . roflumilast (DALIRESP) 500 MCG TABS tablet Take 500 mcg by mouth daily.    Marland Kitchen spironolactone (ALDACTONE) 25 MG tablet Take 25 mg by mouth daily.    . Testosterone (ANDROGEL) 20.25 MG/1.25GM (1.62%) GEL Apply 3 pumps on each arm daily (6) pumps total 2.5 g 3  . tiotropium (SPIRIVA) 18 MCG inhalation capsule Place 18 mcg into inhaler and inhale daily.     No current facility-administered medications on file prior to visit.    Current Medications (verified) Current Outpatient Prescriptions  Medication Sig Dispense Refill  . ACCU-CHEK AVIVA PLUS test strip TEST AS DIRECTED THREE TIMES DAILY 100 each 5  . amiodarone (PACERONE) 200 MG tablet Take 200 mg by mouth 2 (two) times daily.    Marland Kitchen apixaban (ELIQUIS) 5 MG TABS tablet Take 5 mg by mouth 2 (two) times daily.    Marland Kitchen atorvastatin (LIPITOR) 80 MG tablet Take 1 tablet (80 mg total) by mouth daily. 30 tablet 3  . buPROPion (WELLBUTRIN XL) 300 MG 24 hr tablet Take 1 tablet (300 mg total) by mouth daily. 30 tablet 2  . diltiazem (DILACOR XR) 240 MG 24 hr capsule Take 240 mg by mouth daily.    Marland Kitchen doxercalciferol (HECTOROL) 2.5 MCG capsule Take 2.5 mcg by mouth daily.    . DULoxetine (CYMBALTA) 60 MG capsule Take 1 capsule (60 mg total) by mouth daily. 90 capsule 1  . Ergocalciferol (VITAMIN D2 PO) Take 50,000 Units by mouth every 30 (thirty) days.    . Fluticasone Furoate-Vilanterol (BREO ELLIPTA IN) Inhale 1 puff into the lungs.    . furosemide (LASIX) 40 MG tablet Take 40-80 mg by mouth daily.    Marland Kitchen gabapentin (NEURONTIN) 300 MG capsule TAKE 2 CAPSULES BY MOUTH THREE TIMES DAILY 540 capsule 0  . Insulin Aspart (NOVOLOG FLEXPEN [place]) Inject into the skin. 01-20-09    . Insulin Glargine (TOUJEO SOLOSTAR) 300 UNIT/ML SOPN Inject 60 Units into  the skin at bedtime. (Patient taking differently: Inject 60 Units into the skin every morning. ) 4.5 mL 3  . Insulin Syringe-Needle U-100 (LITETOUCH INSULIN SYRINGE) 31G X 5/16" 0.3 ML MISC Use as directed 90 each 1  . ipratropium-albuterol (DUONEB) 0.5-2.5 (3) MG/3ML SOLN Take 3 mLs by nebulization.    Marland Kitchen levothyroxine (SYNTHROID, LEVOTHROID) 112 MCG tablet Take 112 mcg by mouth daily before breakfast.    . montelukast (SINGULAIR) 10 MG tablet Take 10 mg by mouth at bedtime.    . nebivolol (BYSTOLIC) 10 MG tablet Take 10 mg by mouth daily.    Marland Kitchen NOVOLOG FLEXPEN 100 UNIT/ML FlexPen INJECT 20 UNITS THREE TIMES DAILY AS DIRECTED 15 mL 3  . nystatin ointment (MYCOSTATIN) Apply 1 application topically 2 (two) times daily. 30 g 0  . olopatadine (  PATANOL) 0.1 % ophthalmic solution Place 1 drop into both eyes 2 (two) times daily as needed. 5 mL 2  . OXYGEN Inhale into the lungs. 2L all day and night    . ranolazine (RANEXA) 500 MG 12 hr tablet Take 500 mg by mouth 2 (two) times daily.    . roflumilast (DALIRESP) 500 MCG TABS tablet Take 500 mcg by mouth daily.    Marland Kitchen spironolactone (ALDACTONE) 25 MG tablet Take 25 mg by mouth daily.    . Testosterone (ANDROGEL) 20.25 MG/1.25GM (1.62%) GEL Apply 3 pumps on each arm daily (6) pumps total 2.5 g 3  . tiotropium (SPIRIVA) 18 MCG inhalation capsule Place 18 mcg into inhaler and inhale daily.     No current facility-administered medications for this visit.     Allergies (verified) Dust mite extract and Pollen extract   PAST HISTORY  Family History Family History  Problem Relation Age of Onset  . COPD Father   . Diabetes Neg Hx     Social History Social History  Substance Use Topics  . Smoking status: Former Smoker -- 1.00 packs/day for 42 years    Types: Cigarettes    Quit date: 04/07/2001  . Smokeless tobacco: Not on file  . Alcohol Use: No    Are there smokers in your home (other than you)?  No  Risk Factors Current exercise habits: Home  exercise routine includes walking 1 hrs per day.  Dietary issues discussed: Well-balanced diet. Body mass index is 31.32 kg/(m^2).  Cardiac risk factors: advanced age (older than 21 for men, 15 for women), diabetes mellitus, dyslipidemia, family history of premature cardiovascular disease, hypertension, male gender and sedentary lifestyle.  Depression Screen (Note: if answer to either of the following is "Yes", a more complete depression screening is indicated)   Q1: Over the past two weeks, have you felt down, depressed or hopeless? Yes -- seeing therapist with improvement. Is taking his Wellbutrin as directed.  Q2: Over the past two weeks, have you felt little interest or pleasure in doing things? No  Have you lost interest or pleasure in daily life? No  Do you often feel hopeless? No  Do you cry easily over simple problems? No  Activities of Daily Living In your present state of health, do you have any difficulty performing the following activities?:  Driving? No Managing money?  No Feeding yourself? No Getting from bed to chair? 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 No Getting dressed: No Getting to the toilet? No Using the toilet:No Moving around from place to place: No In the past year have you fallen or had a near fall?:No   Are you sexually active?  No  Do you have more than one partner?  N/A  Hearing Difficulties: Yes -- Hearing Aids in place. No symptoms with hearing aid use Do you often ask people to speak up or repeat themselves? No Do you experience ringing or noises in your ears? No Do you have difficulty understanding soft or whispered voices? No   Do you feel that you have a problem with memory? No  Do you often misplace items? No  Do you feel safe at home?  Yes  Cognitive Testing  Alert? Yes  Normal Appearance?Yes  Oriented to person? Yes  Place? Yes   Time? Yes  Recall of three objects?   Yes  Can perform simple calculations? Yes  Displays appropriate judgment?Yes  Can read the  correct time from a watch face?Yes   Advanced Directives have been discussed with the patient? Yes. Does have but not on file.   List the Names of Other Physician/Practitioners you currently use: See care-team tab in EMR for a comprehensive list.  Indicate any recent Medical Services you may have received from other than [facility] providers in the past year (date may be approximate).  Immunization History  Administered Date(s) Administered  . Influenza-Unspecified 02/05/2014  . Pneumococcal Conjugate-13 11/06/2010  . Zoster 11/06/1998    Screening Tests Health Maintenance  Topic Date Due  . OPHTHALMOLOGY EXAM  02/29/1948  . URINE MICROALBUMIN  02/29/1948  . TETANUS/TDAP  02/28/1957  . PNA vac Low Risk Adult (2 of 2 - PPSV23) 11/06/2011  . INFLUENZA VACCINE  11/06/2014  . HEMOGLOBIN A1C  06/05/2015  . FOOT EXAM  08/30/2015  . ZOSTAVAX  Completed    All answers were reviewed with the patient and necessary referrals were made:  Leeanne Rio, PA-C   01/01/2015   History reviewed: allergies, current medications, past family history, past medical history, past social history, past surgical history and problem list  Review of Systems A comprehensive review of systems was negative.    Objective:     Vision by Snellen chart: right eye:20/20, left eye:20/20 Blood pressure 133/72, pulse 94, temperature 98.1 F (36.7 C), temperature source Oral, resp. rate 16, height 6' (1.829 m), weight 231 lb (104.781 kg), SpO2 95 %. Body mass index is 31.32 kg/(m^2).  General appearance: alert, cooperative, appears stated age and no distress Head: Normocephalic, without obvious abnormality, atraumatic Eyes: conjunctivae/corneas clear. PERRL, EOM's intact. Fundi benign. Ears: normal TM's and external ear canals both ears Nose: Nares normal. Septum midline. Mucosa normal. No drainage or sinus  tenderness. Throat: lips, mucosa, and tongue normal; teeth and gums normal Lungs: clear to auscultation bilaterally Heart: regular rate and rhythm, S1, S2 normal, no murmur, click, rub or gallop and normal apical impulse Abdomen: soft, non-tender; bowel sounds normal; no masses,  no organomegaly Extremities: extremities normal, atraumatic, no cyanosis or edema Pulses: 2+ and symmetric Skin: Skin color, texture, turgor normal. No rashes or lesions Neurologic: Alert and oriented X 3, normal strength and tone. Normal symmetric reflexes. Normal coordination and gait     Assessment:     (1) Medicare Wellness, Subsequent (2) Hypertension (3) Hyperlipidemia (4)Diabetes Mellitus II with peripheral neuropathy (5) A. Fib (6) Major Depressive Disorder     Plan:     (1) During the course of the visit the patient was educated and counseled about appropriate screening and preventive services including:    Pneumococcal vaccine   Td vaccine  Screening electrocardiogram  Glaucoma screening  Advanced directives: has an advanced directive - a copy HAS NOT been provided.  Diet review for nutrition referral? Yes ____  Not Indicated _x___  (2) Well-controlled. Continue current regimen. Nephrology to alter regimen based on results.  (3) Continue current regimen. Lipid panel will be abstracted into EMR.  (4) Continue follow-up with Endocrinology. Immunizations and diabetic eye exam updated in EMR.  (5) EKG with sinus rhythm. Follow up with Cardiology. Continue current regimen.  Patient Instructions (the written plan) was given to the patient.  Medicare Attestation I have personally reviewed: The patient's medical and social history Their use of alcohol, tobacco or illicit drugs Their current medications and supplements The patient's functional ability including ADLs,fall risks, home safety risks, cognitive, and hearing and visual impairment Diet and physical activities Evidence for  depression or  mood disorders  The patient's weight, height, BMI, and visual acuity have been recorded in the chart.  I have made referrals, counseling, and provided education to the patient based on review of the above and I have provided the patient with a written personalized care plan for preventive services.     Raiford Noble [place], [place]   01/01/2015

## 2015-01-02 ENCOUNTER — Telehealth: Payer: Self-pay | Admitting: Physician Assistant

## 2015-01-02 DIAGNOSIS — L219 Seborrheic dermatitis, unspecified: Secondary | ICD-10-CM

## 2015-01-02 MED ORDER — KETOCONAZOLE 2 % EX SHAM: 1.0000 "application " | MEDICATED_SHAMPOO | CUTANEOUS | Status: AC

## 2015-01-02 NOTE — Assessment & Plan Note (Signed)
Continue Wellbutrin daily. Continue counseling sessions. Follow-up 6 months.

## 2015-01-02 NOTE — Assessment & Plan Note (Signed)
Continue follow-up with Endocrinology. Immunizations and diabetic eye exam updated in EMR.

## 2015-01-02 NOTE — Assessment & Plan Note (Signed)
Followed by Pulmonology. Continue current medication regimen.

## 2015-01-02 NOTE — Assessment & Plan Note (Signed)
Well-controlled. Continue current regimen. Nephrology to alter regimen based on results.

## 2015-01-02 NOTE — Telephone Encounter (Signed)
Pt says that he went to pick up his prescriptions and didn't see anything for his scaling scalp. Pt says that he called in and requested something for it a few days ago.    Pt request call back to advise.  # [phone].

## 2015-01-02 NOTE — Assessment & Plan Note (Signed)
EKG with sinus rhythm. Follow up with Cardiology. Continue current regimen.  Patient Instructions (the written plan) was given to the patient.

## 2015-01-02 NOTE — Telephone Encounter (Signed)
Rx for ketoconazole sent in. Please inform patient.

## 2015-01-03 NOTE — Telephone Encounter (Signed)
Call pt and informed him that his Rx was sent to pharmacy. Pt expressed understanding and will pick it up.

## 2015-01-03 NOTE — Telephone Encounter (Signed)
Attempted to call patient personally but line was busy. Could not leave message.

## 2015-01-10 ENCOUNTER — Ambulatory Visit (INDEPENDENT_AMBULATORY_CARE_PROVIDER_SITE_OTHER): Payer: 59 | Admitting: Psychology

## 2015-01-10 ENCOUNTER — Encounter (INDEPENDENT_AMBULATORY_CARE_PROVIDER_SITE_OTHER): Payer: Self-pay

## 2015-01-10 DIAGNOSIS — F331 Major depressive disorder, recurrent, moderate: Secondary | ICD-10-CM

## 2015-01-24 ENCOUNTER — Ambulatory Visit (INDEPENDENT_AMBULATORY_CARE_PROVIDER_SITE_OTHER): Payer: 59 | Admitting: Psychology

## 2015-01-24 DIAGNOSIS — F331 Major depressive disorder, recurrent, moderate: Secondary | ICD-10-CM

## 2015-01-25 ENCOUNTER — Encounter: Payer: Self-pay | Admitting: Medical

## 2015-01-25 ENCOUNTER — Ambulatory Visit (INDEPENDENT_AMBULATORY_CARE_PROVIDER_SITE_OTHER): Payer: Medicare Other | Admitting: Medical

## 2015-01-25 VITALS — BP 118/78 | HR 100 | Temp 96.8°F | Resp 16 | Ht 72.0 in | Wt 231.4 lb

## 2015-01-25 DIAGNOSIS — R195 Other fecal abnormalities: Secondary | ICD-10-CM

## 2015-01-25 DIAGNOSIS — R109 Unspecified abdominal pain: Secondary | ICD-10-CM

## 2015-01-25 LAB — CBC WITH DIFFERENTIAL/PLATELET
Basophils Absolute: 0 10*3/uL (ref 0.0–0.1)
Basophils Relative: 0.5 % (ref 0.0–3.0)
EOS PCT: 3.2 % (ref 0.0–5.0)
Eosinophils Absolute: 0.3 10*3/uL (ref 0.0–0.7)
HEMATOCRIT: 37.9 % — AB (ref 39.0–52.0)
HEMOGLOBIN: 12.4 g/dL — AB (ref 13.0–17.0)
LYMPHS PCT: 15.4 % (ref 12.0–46.0)
Lymphs Abs: 1.5 10*3/uL (ref 0.7–4.0)
MCHC: 32.7 g/dL (ref 30.0–36.0)
MCV: 91.2 fl (ref 78.0–100.0)
MONO ABS: 0.7 10*3/uL (ref 0.1–1.0)
Monocytes Relative: 7.2 % (ref 3.0–12.0)
Neutro Abs: 7 10*3/uL (ref 1.4–7.7)
Neutrophils Relative %: 73.7 % (ref 43.0–77.0)
Platelets: 344 10*3/uL (ref 150.0–400.0)
RBC: 4.16 Mil/uL — ABNORMAL LOW (ref 4.22–5.81)
RDW: 13.4 % (ref 11.5–15.5)
WBC: 9.6 10*3/uL (ref 4.0–10.5)

## 2015-01-25 LAB — COMPREHENSIVE METABOLIC PANEL
ALBUMIN: 3.5 g/dL (ref 3.5–5.2)
ALK PHOS: 128 U/L — AB (ref 39–117)
ALT: 9 U/L (ref 0–53)
AST: 14 U/L (ref 0–37)
BUN: 30 mg/dL — AB (ref 6–23)
CALCIUM: 8.3 mg/dL — AB (ref 8.4–10.5)
CO2: 29 mEq/L (ref 19–32)
CREATININE: 2.5 mg/dL — AB (ref 0.40–1.50)
Chloride: 106 mEq/L (ref 96–112)
GFR: 26.76 mL/min — ABNORMAL LOW (ref >60.00)
Glucose, Bld: 157 mg/dL — ABNORMAL HIGH (ref 70–99)
Potassium: 3.6 mEq/L (ref 3.5–5.1)
SODIUM: 145 meq/L (ref 135–145)
TOTAL PROTEIN: 6.2 g/dL (ref 6.0–8.3)
Total Bilirubin: 0.3 mg/dL (ref 0.2–1.2)

## 2015-01-25 NOTE — Progress Notes (Signed)
Pre visit review using our clinic review tool, if applicable. No additional management support is needed unless otherwise documented below in the visit note. 

## 2015-01-25 NOTE — Patient Instructions (Signed)
For your loose stools recommend gas-x or phayzme otc. For loose stools use immmodium 1-2 times day.  Increase fiber in diet.  If your loose stools worsen again get stool studies.   Cbc and cmp today.  Will refer you to GI  Follow up here Tuesday or Wed next week.  Any severe change in signs and symptoms after hours then ED evaluation.

## 2015-01-25 NOTE — Progress Notes (Signed)
Subjective:    Patient ID: Ricky Baker, male    DOB: Mar 25, 1936, 79 y.o.   MRN: [id number]  HPI  Pt in for some digestive issues which he states going on for about a month. Pt states loose stools. Not exactly diarrhea(3-4 loose stools a day over past 3 weeks). Over last week about one loose stool a day. Then he states has random gas and bloating sensation. Pt states seen 2-3 weeks ago. Pt has used some yogurt and probiotics. Has not helped. Stomach churns a little bit. Pt states gets gasy sensation in lower abd/suprapubic area. Pt has hx of esophageal cancer years ago.  No fevers, no chills or sweats.  Pt last colonosocpy was 3-4 years ago. Pt states some polyps in the past.  Pt thinks he may have some bacteria.  Pt does not have local Gi doctor. Last one he saw was in [place] last spring.  Review of Systems  Constitutional: Negative for fever, chills and fatigue.  Respiratory: Negative for cough, chest tightness and wheezing.   Cardiovascular: Negative for chest pain and palpitations.  Gastrointestinal: Positive for diarrhea. Negative for nausea, vomiting, abdominal pain, constipation, blood in stool and rectal pain.       Abdominal gasy sensation on and off. No severe pain.  More describes watery loose stools.  Musculoskeletal: Negative for back pain.  Skin: Negative for rash.  Neurological: Negative for dizziness, weakness, light-headedness and headaches.  Hematological: Negative for adenopathy. Does not bruise/bleed easily.  Psychiatric/Behavioral: Negative for behavioral problems, confusion and dysphoric mood.    Past Medical History  Diagnosis Date  . COPD (chronic obstructive pulmonary disease) ([place])   . CHF (congestive heart failure) ([place])   . Thyroid disease   . Chronic bronchitis ([place])   . Atrial fibrillation ([place])   . CKD (chronic kidney disease)   . Diabetes mellitus without complication ([place])   . Allergy   . Cancer ([facility])     esophogeal   . CKD  (chronic kidney disease)   . Depression   . Hypertension     Social History   Social History  . Marital Status: Single    Spouse Name: N/A  . Number of Children: N/A  . Years of Education: N/A   Occupational History  . Not on file.   Social History Main Topics  . Smoking status: Former Smoker -- 1.00 packs/day for 42 years    Types: Cigarettes    Quit date: 04/07/2001  . Smokeless tobacco: Not on file  . Alcohol Use: No  . Drug Use: Not on file  . Sexual Activity: Not on file   Other Topics Concern  . Not on file   Social History Narrative    Past Surgical History  Procedure Laterality Date  . Esophagus surgery  2006  . Spine surgery      bone spurs  . Cardiac catheterization  2009    two stents placed  . Tonsillectomy      Family History  Problem Relation Age of Onset  . COPD Father   . Diabetes Neg Hx     Allergies  Allergen Reactions  . Dust Mite Extract Other (See Comments)    Runny nose   . Pollen Extract Other (See Comments)    Runny nose    Current Outpatient Prescriptions on File Prior to Visit  Medication Sig Dispense Refill  . ACCU-CHEK AVIVA PLUS test strip TEST AS DIRECTED THREE TIMES DAILY 100 each 5  . amiodarone (  PACERONE) 200 MG tablet Take 200 mg by mouth 2 (two) times daily.    Marland Kitchen apixaban (ELIQUIS) 5 MG TABS tablet Take 5 mg by mouth 2 (two) times daily.    Marland Kitchen atorvastatin (LIPITOR) 80 MG tablet Take 1 tablet (80 mg total) by mouth daily. 30 tablet 3  . buPROPion (WELLBUTRIN XL) 300 MG 24 hr tablet Take 1 tablet (300 mg total) by mouth daily. 30 tablet 2  . diltiazem (DILACOR XR) 240 MG 24 hr capsule Take 240 mg by mouth daily.    Marland Kitchen doxercalciferol (HECTOROL) 2.5 MCG capsule Take 2.5 mcg by mouth daily.    . DULoxetine (CYMBALTA) 60 MG capsule Take 1 capsule (60 mg total) by mouth daily. 90 capsule 1  . Ergocalciferol (VITAMIN D2 PO) Take 50,000 Units by mouth every 30 (thirty) days.    . Fluticasone Furoate-Vilanterol (BREO ELLIPTA  IN) Inhale 1 puff into the lungs.    . furosemide (LASIX) 40 MG tablet Take 40-80 mg by mouth daily.    Marland Kitchen gabapentin (NEURONTIN) 300 MG capsule TAKE 2 CAPSULES BY MOUTH THREE TIMES DAILY 540 capsule 0  . Insulin Aspart (NOVOLOG FLEXPEN [place]) Inject into the skin. 01-20-09    . Insulin Glargine (TOUJEO SOLOSTAR) 300 UNIT/ML SOPN Inject 60 Units into the skin at bedtime. (Patient taking differently: Inject 60 Units into the skin every morning. ) 4.5 mL 3  . Insulin Syringe-Needle U-100 (LITETOUCH INSULIN SYRINGE) 31G X 5/16" 0.3 ML MISC Use as directed 90 each 1  . ipratropium-albuterol (DUONEB) 0.5-2.5 (3) MG/3ML SOLN Take 3 mLs by nebulization.    Marland Kitchen ketoconazole (NIZORAL) 2 % shampoo Apply 1 application topically 2 (two) times a week. 120 mL 0  . levothyroxine (SYNTHROID, LEVOTHROID) 112 MCG tablet Take 112 mcg by mouth daily before breakfast.    . montelukast (SINGULAIR) 10 MG tablet Take 10 mg by mouth at bedtime.    . nebivolol (BYSTOLIC) 10 MG tablet Take 10 mg by mouth daily.    Marland Kitchen NOVOLOG FLEXPEN 100 UNIT/ML FlexPen INJECT 20 UNITS THREE TIMES DAILY AS DIRECTED 15 mL 3  . nystatin ointment (MYCOSTATIN) Apply 1 application topically 2 (two) times daily. 30 g 0  . olopatadine (PATANOL) 0.1 % ophthalmic solution Place 1 drop into both eyes 2 (two) times daily as needed. 5 mL 2  . OXYGEN Inhale into the lungs. 2L all day and night    . ranolazine (RANEXA) 500 MG 12 hr tablet Take 500 mg by mouth 2 (two) times daily.    . roflumilast (DALIRESP) 500 MCG TABS tablet Take 500 mcg by mouth daily.    Marland Kitchen spironolactone (ALDACTONE) 25 MG tablet Take 25 mg by mouth daily.    . Testosterone (ANDROGEL) 20.25 MG/1.25GM (1.62%) GEL Apply 3 pumps on each arm daily (6) pumps total 2.5 g 3  . tiotropium (SPIRIVA) 18 MCG inhalation capsule Place 18 mcg into inhaler and inhale daily.     No current facility-administered medications on file prior to visit.    BP 118/78 mmHg  Pulse 100  Temp(Src) 96.8 F (36 C)  (Oral)  Resp 16  Ht 6' (1.829 m)  Wt 231 lb 6.4 oz (104.962 kg)  BMI 31.38 kg/m2  SpO2 96%       Objective:   Physical Exam  General Appearance- Not in acute distress.  HEENT Eyes- Scleraeral/Conjuntiva-bilat- Not Yellow. Mouth & Throat- Normal.  Chest and Lung Exam Auscultation: Breath sounds:-Normal. Adventitious sounds:- No Adventitious sounds.  Cardiovascular Auscultation:Rythm - Regular.  Heart Sounds -Normal heart sounds.  Abdomen Inspection:-Inspection Normal.  Palpation/Perucssion: Palpation and Percussion of the abdomen reveal- Non Tender, No Rebound tenderness, No rigidity(Guarding) and No Palpable abdominal masses.  Liver:-Normal.  Spleen:- Normal.   Rectal Anorectal Exam: Stool - Hemoccult of stool/mucous is Heme Negative. External - normal external exam. Internal - normal sphincter tone. No rectal mass.      Assessment & Plan:  For your loose stools recommend gas-x or phayzme otc. For loose stools use immmodium 1-2 times day.  Increase fiber in diet.  If your loose stools worsen again get stool studies.   Cbc and cmp today.  Will refer you to GI  Follow up here Tuesday or Wed next week.  Any severe change in signs and symptoms after hours then ED evaluation.  Note I considered possible very atypical case diverticulitis. But he reports no hx of diverticulosis and clinical presentation did not match presently today. If not improving or worsens would keep this in differential.

## 2015-01-31 ENCOUNTER — Encounter: Payer: Self-pay | Admitting: Physician Assistant

## 2015-01-31 ENCOUNTER — Ambulatory Visit (INDEPENDENT_AMBULATORY_CARE_PROVIDER_SITE_OTHER): Payer: Medicare Other | Admitting: Physician Assistant

## 2015-01-31 VITALS — BP 125/77 | HR 97 | Temp 98.3°F | Resp 16 | Ht 72.0 in | Wt 233.0 lb

## 2015-01-31 DIAGNOSIS — K529 Noninfective gastroenteritis and colitis, unspecified: Secondary | ICD-10-CM | POA: Insufficient documentation

## 2015-01-31 NOTE — Progress Notes (Signed)
Pre visit review using our clinic review tool, if applicable. No additional management support is needed unless otherwise documented below in the visit note/SLS  

## 2015-01-31 NOTE — Patient Instructions (Signed)
Please continue fiber, hydration and probiotic.  Limit heavy consumption of leafy vegetables as this may worsen gaseousness.  Continue the GAS-X.  Make sure to collect your stool sample and bring in. Call your Gastroenterologist to schedule an appointment.

## 2015-01-31 NOTE — Assessment & Plan Note (Signed)
Suspect viral and much improved. Still some mild gaseousness and abdominal cramping. Will proceed with stool studies (patient has kit at home). Patient to continue current regimen and call to schedule appointment with GI due to chronic issues.

## 2015-01-31 NOTE — Progress Notes (Signed)
Patient presents to clinic today for 1 week follow-up. Was seen last week by another provider for loose stools, bloating and abdominal discomfort. CBC and BMP obtained and unremarkable. Patient instructed to increase dietary fiber and follow-up. Patient endorses improvement in symptoms but notes continued bloating and increased gas. Is still having some abdominal discomfort described as mild and crampy. Endorses formed stools at present without melena or hematochezia. Denies fever, chills, fatigue, nausea or vomiting. Is taking a probiotic daily. Denies heart burn or indigestion. Has been contacted by GI referral but has not called back to schedule an appointment.  Past Medical History  Diagnosis Date  . COPD (chronic obstructive pulmonary disease) ([place])   . CHF (congestive heart failure) ([place])   . Thyroid disease   . Chronic bronchitis ([place])   . Atrial fibrillation ([place])   . CKD (chronic kidney disease)   . Diabetes mellitus without complication ([place])   . Allergy   . Cancer (HCC)     esophogeal   . CKD (chronic kidney disease)   . Depression   . Hypertension     Current Outpatient Prescriptions on File Prior to Visit  Medication Sig Dispense Refill  . ACCU-CHEK AVIVA PLUS test strip TEST AS DIRECTED THREE TIMES DAILY 100 each 5  . amiodarone (PACERONE) 200 MG tablet Take 200 mg by mouth 2 (two) times daily.    Marland Kitchen apixaban (ELIQUIS) 5 MG TABS tablet Take 5 mg by mouth 2 (two) times daily.    Marland Kitchen atorvastatin (LIPITOR) 80 MG tablet Take 1 tablet (80 mg total) by mouth daily. 30 tablet 3  . buPROPion (WELLBUTRIN XL) 300 MG 24 hr tablet Take 1 tablet (300 mg total) by mouth daily. 30 tablet 2  . diltiazem (DILACOR XR) 240 MG 24 hr capsule Take 240 mg by mouth daily.    Marland Kitchen doxercalciferol (HECTOROL) 2.5 MCG capsule Take 2.5 mcg by mouth daily.    . DULoxetine (CYMBALTA) 60 MG capsule Take 1 capsule (60 mg total) by mouth daily. 90 capsule 1  . Ergocalciferol (VITAMIN D2 PO) Take 50,000  Units by mouth every 30 (thirty) days.    . Fluticasone Furoate-Vilanterol (BREO ELLIPTA IN) Inhale 1 puff into the lungs.    . furosemide (LASIX) 40 MG tablet Take 40-80 mg by mouth daily.    Marland Kitchen gabapentin (NEURONTIN) 300 MG capsule TAKE 2 CAPSULES BY MOUTH THREE TIMES DAILY 540 capsule 0  . Insulin Aspart (NOVOLOG FLEXPEN [place]) Inject into the skin. 01-20-09    . Insulin Glargine (TOUJEO SOLOSTAR) 300 UNIT/ML SOPN Inject 60 Units into the skin at bedtime. (Patient taking differently: Inject 60 Units into the skin every morning. ) 4.5 mL 3  . Insulin Syringe-Needle U-100 (LITETOUCH INSULIN SYRINGE) 31G X 5/16" 0.3 ML MISC Use as directed 90 each 1  . ipratropium-albuterol (DUONEB) 0.5-2.5 (3) MG/3ML SOLN Take 3 mLs by nebulization.    Marland Kitchen ketoconazole (NIZORAL) 2 % shampoo Apply 1 application topically 2 (two) times a week. 120 mL 0  . levothyroxine (SYNTHROID, LEVOTHROID) 112 MCG tablet Take 112 mcg by mouth daily before breakfast.    . montelukast (SINGULAIR) 10 MG tablet Take 10 mg by mouth at bedtime.    . nebivolol (BYSTOLIC) 10 MG tablet Take 10 mg by mouth daily.    Marland Kitchen NOVOLOG FLEXPEN 100 UNIT/ML FlexPen INJECT 20 UNITS THREE TIMES DAILY AS DIRECTED 15 mL 3  . nystatin ointment (MYCOSTATIN) Apply 1 application topically 2 (two) times daily. 30 g 0  .  olopatadine (PATANOL) 0.1 % ophthalmic solution Place 1 drop into both eyes 2 (two) times daily as needed. 5 mL 2  . OXYGEN Inhale into the lungs. 2L all day and night    . ranolazine (RANEXA) 500 MG 12 hr tablet Take 500 mg by mouth 2 (two) times daily.    . roflumilast (DALIRESP) 500 MCG TABS tablet Take 500 mcg by mouth daily.    Marland Kitchen spironolactone (ALDACTONE) 25 MG tablet Take 25 mg by mouth daily.    . Testosterone (ANDROGEL) 20.25 MG/1.25GM (1.62%) GEL Apply 3 pumps on each arm daily (6) pumps total 2.5 g 3  . tiotropium (SPIRIVA) 18 MCG inhalation capsule Place 18 mcg into inhaler and inhale daily.     No current facility-administered  medications on file prior to visit.    Allergies  Allergen Reactions  . Dust Mite Extract Other (See Comments)    Runny nose   . Pollen Extract Other (See Comments)    Runny nose    Family History  Problem Relation Age of Onset  . COPD Father   . Diabetes Neg Hx     Social History   Social History  . Marital Status: Single    Spouse Name: N/A  . Number of Children: N/A  . Years of Education: N/A   Social History Main Topics  . Smoking status: Former Smoker -- 1.00 packs/day for 42 years    Types: Cigarettes    Quit date: 04/07/2001  . Smokeless tobacco: None  . Alcohol Use: No  . Drug Use: None  . Sexual Activity: Not Asked   Other Topics Concern  . None   Social History Narrative   Review of Systems - See HPI.  All other ROS are negative.  BP 125/77 mmHg  Pulse 97  Temp(Src) 98.3 F (36.8 C) (Oral)  Resp 16  Ht 6' (1.829 m)  Wt 233 lb (105.688 kg)  BMI 31.59 kg/m2  SpO2 97%  Physical Exam  Constitutional: He is oriented to person, place, and time and well-developed, well-nourished, and in no distress.  HENT:  Head: Normocephalic and atraumatic.  Eyes: Conjunctivae are normal.  Cardiovascular: Normal rate, regular rhythm, normal heart sounds and intact distal pulses.   Pulmonary/Chest: Effort normal and breath sounds normal. No respiratory distress. He has no wheezes. He has no rales. He exhibits no tenderness.  Abdominal: Soft. Normal appearance. He exhibits no distension and no mass. Bowel sounds are hyperactive. There is no tenderness. There is no rebound and no guarding.  Neurological: He is alert and oriented to person, place, and time.  Skin: Skin is warm and dry. No rash noted.  Psychiatric: Affect normal.  Vitals reviewed.  Recent Results (from the past 2160 hour(s))  Hemoglobin A1c     Status: Abnormal   Collection Time: 12/04/14  1:53 PM  Result Value Ref Range   Hgb A1c MFr Bld 6.6 (H) 4.6 - 6.5 %    Comment: Glycemic Control Guidelines  for People with Diabetes:Non Diabetic:  <6%Goal of Therapy: <7%Additional Action Suggested:  >8%   Basic metabolic panel     Status: Abnormal   Collection Time: 12/04/14  1:53 PM  Result Value Ref Range   Sodium 141 135 - 145 mEq/L   Potassium 4.0 3.5 - 5.1 mEq/L   Chloride 105 96 - 112 mEq/L   CO2 27 19 - 32 mEq/L   Glucose, Bld 158 (H) 70 - 99 mg/dL   BUN 32 (H) 6 - 23 mg/dL  Creatinine, Ser 2.73 (H) 0.40 - 1.50 mg/dL   Calcium 8.8 8.4 - 10.5 mg/dL   GFR 24.18 (L) >60.00 mL/min  Testosterone     Status: Abnormal   Collection Time: 12/04/14  1:53 PM  Result Value Ref Range   Testosterone 278.95 (L) 300.00 - 890.00 ng/dL  Lipid panel     Status: Abnormal   Collection Time: 12/04/14  1:53 PM  Result Value Ref Range   Cholesterol 134 0 - 200 mg/dL    Comment: ATP III Classification       Desirable:  < 200 mg/dL               Borderline High:  200 - 239 mg/dL          High:  > = 240 mg/dL   Triglycerides 186.0 (H) 0.0 - 149.0 mg/dL    Comment: Normal:  <150 mg/dLBorderline High:  150 - 199 mg/dL   HDL 34.90 (L) >39.00 mg/dL   VLDL 37.2 0.0 - 40.0 mg/dL   LDL Cholesterol 62 0 - 99 mg/dL   Total CHOL/HDL Ratio 4     Comment:                Men          Women1/2 Average Risk     3.4          3.3Average Risk          5.0          4.42X Average Risk          9.6          7.13X Average Risk          15.0          11.0                       NonHDL 99.17     Comment: NOTE:  Non-HDL goal should be 30 mg/dL higher than patient's LDL goal (i.e. LDL goal of < 70 mg/dL, would have non-HDL goal of < 100 mg/dL)  CBC w/Diff     Status: Abnormal   Collection Time: 01/25/15 11:33 AM  Result Value Ref Range   WBC 9.6 4.0 - 10.5 K/uL   RBC 4.16 (L) 4.22 - 5.81 Mil/uL   Hemoglobin 12.4 (L) 13.0 - 17.0 g/dL   HCT 37.9 (L) 39.0 - 52.0 %   MCV 91.2 78.0 - 100.0 fl   MCHC 32.7 30.0 - 36.0 g/dL   RDW 13.4 11.5 - 15.5 %   Platelets 344.0 150.0 - 400.0 K/uL   Neutrophils Relative % 73.7 43.0 - 77.0 %    Lymphocytes Relative 15.4 12.0 - 46.0 %   Monocytes Relative 7.2 3.0 - 12.0 %   Eosinophils Relative 3.2 0.0 - 5.0 %   Basophils Relative 0.5 0.0 - 3.0 %   Neutro Abs 7.0 1.4 - 7.7 K/uL   Lymphs Abs 1.5 0.7 - 4.0 K/uL   Monocytes Absolute 0.7 0.1 - 1.0 K/uL   Eosinophils Absolute 0.3 0.0 - 0.7 K/uL   Basophils Absolute 0.0 0.0 - 0.1 K/uL  Comprehensive metabolic panel     Status: Abnormal   Collection Time: 01/25/15 11:33 AM  Result Value Ref Range   Sodium 145 135 - 145 mEq/L   Potassium 3.6 3.5 - 5.1 mEq/L   Chloride 106 96 - 112 mEq/L   CO2 29 19 - 32 mEq/L   Glucose, Bld 157 (H)  70 - 99 mg/dL   BUN 30 (H) 6 - 23 mg/dL   Creatinine, Ser 2.50 (H) 0.40 - 1.50 mg/dL   Total Bilirubin 0.3 0.2 - 1.2 mg/dL   Alkaline Phosphatase 128 (H) 39 - 117 U/L   AST 14 0 - 37 U/L   ALT 9 0 - 53 U/L   Total Protein 6.2 6.0 - 8.3 g/dL   Albumin 3.5 3.5 - 5.2 g/dL   Calcium 8.3 (L) 8.4 - 10.5 mg/dL   GFR 26.76 (L) >60.00 mL/min   Assessment/Plan: Gastroenteritis Suspect viral and much improved. Still some mild gaseousness and abdominal cramping. Will proceed with stool studies (patient has kit at home). Patient to continue current regimen and call to schedule appointment with GI due to chronic issues.

## 2015-02-01 ENCOUNTER — Other Ambulatory Visit: Payer: Medicare Other

## 2015-02-01 ENCOUNTER — Other Ambulatory Visit: Payer: Self-pay | Admitting: Medical

## 2015-02-02 ENCOUNTER — Encounter: Payer: Self-pay | Admitting: Physician Assistant

## 2015-02-02 LAB — CLOSTRIDIUM DIFFICILE BY PCR: CDIFFPCR: NOT DETECTED

## 2015-02-02 LAB — OVA AND PARASITE EXAMINATION: OP: NONE SEEN

## 2015-02-05 LAB — STOOL CULTURE

## 2015-02-07 ENCOUNTER — Ambulatory Visit (INDEPENDENT_AMBULATORY_CARE_PROVIDER_SITE_OTHER): Payer: 59 | Admitting: Psychology

## 2015-02-07 DIAGNOSIS — F331 Major depressive disorder, recurrent, moderate: Secondary | ICD-10-CM | POA: Diagnosis not present

## 2015-02-12 ENCOUNTER — Encounter: Payer: Self-pay | Admitting: Internal Medicine

## 2015-02-20 ENCOUNTER — Other Ambulatory Visit: Payer: Self-pay

## 2015-02-21 ENCOUNTER — Other Ambulatory Visit (INDEPENDENT_AMBULATORY_CARE_PROVIDER_SITE_OTHER): Payer: Medicare Other | Admitting: *Deleted

## 2015-02-21 ENCOUNTER — Ambulatory Visit (INDEPENDENT_AMBULATORY_CARE_PROVIDER_SITE_OTHER): Payer: 59 | Admitting: Psychology

## 2015-02-21 DIAGNOSIS — F331 Major depressive disorder, recurrent, moderate: Secondary | ICD-10-CM | POA: Diagnosis not present

## 2015-02-21 DIAGNOSIS — R011 Cardiac murmur, unspecified: Secondary | ICD-10-CM

## 2015-02-21 DIAGNOSIS — I209 Angina pectoris, unspecified: Secondary | ICD-10-CM

## 2015-02-21 DIAGNOSIS — I480 Paroxysmal atrial fibrillation: Secondary | ICD-10-CM

## 2015-02-21 DIAGNOSIS — Z8679 Personal history of other diseases of the circulatory system: Secondary | ICD-10-CM

## 2015-02-21 DIAGNOSIS — I10 Essential (primary) hypertension: Secondary | ICD-10-CM

## 2015-02-21 DIAGNOSIS — E785 Hyperlipidemia, unspecified: Secondary | ICD-10-CM

## 2015-02-21 LAB — COMPREHENSIVE METABOLIC PANEL
ALT: 8 U/L — ABNORMAL LOW (ref 9–46)
AST: 11 U/L (ref 10–35)
Albumin: 3.6 g/dL (ref 3.6–5.1)
Alkaline Phosphatase: 141 U/L — ABNORMAL HIGH (ref 40–115)
BUN: 41 mg/dL — ABNORMAL HIGH (ref 7–25)
CO2: 22 mmol/L (ref 20–31)
Calcium: 8.1 mg/dL — ABNORMAL LOW (ref 8.6–10.3)
Chloride: 109 mmol/L (ref 98–110)
Creat: 2.77 mg/dL — ABNORMAL HIGH (ref 0.70–1.18)
Glucose, Bld: 124 mg/dL — ABNORMAL HIGH (ref 65–99)
Potassium: 4.2 mmol/L (ref 3.5–5.3)
Sodium: 143 mmol/L (ref 135–146)
Total Bilirubin: 0.5 mg/dL (ref 0.2–1.2)
Total Protein: 5.6 g/dL — ABNORMAL LOW (ref 6.1–8.1)

## 2015-02-21 LAB — HEMOGLOBIN A1C
Hgb A1c MFr Bld: 6.8 % — ABNORMAL HIGH (ref <5.7)
Mean Plasma Glucose: 148 mg/dL — ABNORMAL HIGH (ref <117)

## 2015-02-21 LAB — LIPID PANEL
Cholesterol: 179 mg/dL (ref 125–200)
HDL: 31 mg/dL — ABNORMAL LOW (ref >=40)
LDL Cholesterol: 107 mg/dL (ref <130)
Total CHOL/HDL Ratio: 5.8 Ratio — ABNORMAL HIGH (ref <=5.0)
Triglycerides: 205 mg/dL — ABNORMAL HIGH (ref <150)
VLDL: 41 mg/dL — ABNORMAL HIGH (ref <30)

## 2015-02-21 LAB — TSH: TSH: 1.827 u[IU]/mL (ref 0.350–4.500)

## 2015-02-21 NOTE — Addendum Note (Signed)
Addended by: Eulis Foster on: 02/21/2015 10:03 AM   Modules accepted: Orders

## 2015-02-21 NOTE — Addendum Note (Signed)
Addended by: Eulis Foster on: 02/21/2015 10:02 AM   Modules accepted: Orders

## 2015-02-27 ENCOUNTER — Ambulatory Visit: Payer: Medicare Other | Admitting: Cardiology

## 2015-03-05 ENCOUNTER — Ambulatory Visit (INDEPENDENT_AMBULATORY_CARE_PROVIDER_SITE_OTHER): Payer: Medicare Other | Admitting: Physician Assistant

## 2015-03-05 ENCOUNTER — Encounter: Payer: Self-pay | Admitting: Physician Assistant

## 2015-03-05 VITALS — BP 108/48 | HR 72 | Ht 71.0 in | Wt 231.4 lb

## 2015-03-05 DIAGNOSIS — R1084 Generalized abdominal pain: Secondary | ICD-10-CM | POA: Diagnosis not present

## 2015-03-05 DIAGNOSIS — Z8501 Personal history of malignant neoplasm of esophagus: Secondary | ICD-10-CM | POA: Diagnosis not present

## 2015-03-05 DIAGNOSIS — R630 Anorexia: Secondary | ICD-10-CM | POA: Diagnosis not present

## 2015-03-05 DIAGNOSIS — R143 Flatulence: Secondary | ICD-10-CM

## 2015-03-05 DIAGNOSIS — R140 Abdominal distension (gaseous): Secondary | ICD-10-CM | POA: Diagnosis not present

## 2015-03-05 NOTE — Patient Instructions (Addendum)
Continue daily probiotic.  Suggestions are Align, Culturelle, FLora Stor.  1 tablet daily.  You have been scheduled for a CT scan of the abdomen and pelvis at [facility] ([address]---this is in the same building as Press [facility]).   You are scheduled on 03-08-2015 Thursday at 2:30 PM . You should arrive 15 minutes prior to your appointment time for registration. Please follow the written instructions below on the day of your exam:  WARNING: IF YOU ARE ALLERGIC TO IODINE/X-RAY DYE, PLEASE NOTIFY RADIOLOGY IMMEDIATELY AT [phone]! YOU WILL BE GIVEN A 13 HOUR PREMEDICATION PREP.  1) Do not eat or drink anything after 10:30 am  (4 hours prior to your test) 2) You have been given 2 bottles of oral contrast to drink. The solution may taste better if refrigerated, but do NOT add ice or any other liquid to this solution. Shake  well before drinking.    Drink 1 bottle of contrast @ 12:30 PM  (2 hours prior to your exam)  Drink 1 bottle of contrast @ 1:30 PM  (1 hour prior to your exam)  You may take any medications as prescribed with a small amount of water except for the following: Metformin, Glucophage, Glucovance, Avandamet, Riomet, Fortamet, Actoplus Met, Janumet, Glumetza or Metaglip. The above medications must be held the day of the exam AND 48 hours after the exam.  The purpose of you drinking the oral contrast is to aid in the visualization of your intestinal tract. The contrast solution may cause some diarrhea. Before your exam is started, you will be given a small amount of fluid to drink. Depending on your individual set of symptoms, you may also receive an intravenous injection of x-ray contrast/dye. Plan on being at [facility] for 30 minutes or long, depending on the type of exam you are having performed.  If you have any questions regarding your exam or if you need to reschedule, you may call the CT department at [phone] between the hours of 8:00 am and  5:00 pm, Monday-Friday.  Made you a follow up with Dr. Harl Bowie for 05-07-2015 at 1:45 PM. ________________________________________________________________________

## 2015-03-05 NOTE — Progress Notes (Signed)
Reviewed and agree with documentation and assessment and plan. K. Veena Nandigam , MD   

## 2015-03-05 NOTE — Progress Notes (Signed)
Patient ID: Ricky Baker, male   DOB: 26-Jan-1936, 79 y.o.   MRN: [id number]   Subjective:    Patient ID: Ricky Baker, male    DOB: [birth year], 79 y.o.   MRN: [id number]  HPI  Anfrenee   is a pleasant 79 year old white male new to GI today referred by Mackie Pai  PA-C for evaluation of diarrhea urgency bloating and abdominal discomfort. Patient relates having seen Dr. Juanita Craver  prior to 2006 and then has had GI evaluation in [facility] since that time. He says that he's had 3-4 colonoscopies and that the last one was negative and he was told he probably would not need another one. He thinks this was likely done in 2009. Patient has history of esophageal cancer diagnosed in 2006 he is status post esophagectomy done at [facility]. Patient states that his symptoms started a couple of months ago with some urgency for bowel movements and very loose stools associated with gas and bloating. He was seen by primary care , initially started on a probiotic and Imodium. Says that wasn't very helpful initially and he return to primary care which time he had stool studies done all of which were negative. Now over the past week or so his diarrhea has resolved he is off of Imodium and says he is actually constipated which is more his usual.   He says his appetite has been decreased over the past few months he has had kind of ongoing bloating and discomfort though no pain as well as lots of flatulence and belching. He says he gets nauseated if he over eats but has not vomited. No abdominal surgery. Family history negative for colon cancer and polyps. Patient does have multiple comorbidities including COPD, hypertension, atrial fibrillation for which she is on eliquis, depression, coronary artery disease, chronic kidney disease, adult-onset diabetes mellitus. He has not started any new medications over the past few months, no recent antibiotics.    Review of Systems Pertinent positive and  negative review of systems were noted in the above HPI section.  All other review of systems was otherwise negative.  Outpatient Encounter Prescriptions as of 03/05/2015  Medication Sig  . ACCU-CHEK AVIVA PLUS test strip TEST AS DIRECTED THREE TIMES DAILY  . amiodarone (PACERONE) 200 MG tablet Take 200 mg by mouth 2 (two) times daily.  Marland Kitchen apixaban (ELIQUIS) 5 MG TABS tablet Take 5 mg by mouth 2 (two) times daily.  Marland Kitchen atorvastatin (LIPITOR) 80 MG tablet Take 1 tablet (80 mg total) by mouth daily.  Marland Kitchen BREO ELLIPTA 100-25 MCG/INH AEPB   . buPROPion (WELLBUTRIN XL) 300 MG 24 hr tablet Take 1 tablet (300 mg total) by mouth daily.  Marland Kitchen diltiazem (DILACOR XR) 240 MG 24 hr capsule Take 240 mg by mouth daily.  Marland Kitchen doxercalciferol (HECTOROL) 2.5 MCG capsule Take 2.5 mcg by mouth daily.  . DULoxetine (CYMBALTA) 60 MG capsule Take 1 capsule (60 mg total) by mouth daily.  . Ergocalciferol (VITAMIN D2 PO) Take 50,000 Units by mouth every 30 (thirty) days.  . Fluticasone Furoate-Vilanterol (BREO ELLIPTA IN) Inhale 1 puff into the lungs.  . furosemide (LASIX) 40 MG tablet Take 40-80 mg by mouth daily.  Marland Kitchen gabapentin (NEURONTIN) 300 MG capsule TAKE 2 CAPSULES BY MOUTH THREE TIMES DAILY  . Insulin Aspart (NOVOLOG FLEXPEN [place]) Inject into the skin. 01-20-09  . Insulin Glargine (TOUJEO SOLOSTAR) 300 UNIT/ML SOPN Inject 60 Units into the skin at bedtime. (Patient taking differently: Inject 60 Units  into the skin every morning. )  . Insulin Syringe-Needle U-100 (LITETOUCH INSULIN SYRINGE) 31G X 5/16" 0.3 ML MISC Use as directed  . ipratropium-albuterol (DUONEB) 0.5-2.5 (3) MG/3ML SOLN Take 3 mLs by nebulization.  Marland Kitchen ketoconazole (NIZORAL) 2 % shampoo Apply 1 application topically 2 (two) times a week.  . levothyroxine (SYNTHROID, LEVOTHROID) 112 MCG tablet Take 112 mcg by mouth daily before breakfast.  . montelukast (SINGULAIR) 10 MG tablet Take 10 mg by mouth at bedtime.  . nebivolol (BYSTOLIC) 10 MG tablet Take 10 mg by  mouth daily.  Marland Kitchen NOVOLOG FLEXPEN 100 UNIT/ML FlexPen INJECT 20 UNITS THREE TIMES DAILY AS DIRECTED  . nystatin ointment (MYCOSTATIN) Apply 1 application topically 2 (two) times daily.  Marland Kitchen olopatadine (PATANOL) 0.1 % ophthalmic solution Place 1 drop into both eyes 2 (two) times daily as needed.  . OXYGEN Inhale into the lungs. 2L all day and night  . ranolazine (RANEXA) 500 MG 12 hr tablet Take 500 mg by mouth 2 (two) times daily.  . roflumilast (DALIRESP) 500 MCG TABS tablet Take 500 mcg by mouth daily.  Marland Kitchen spironolactone (ALDACTONE) 25 MG tablet Take 25 mg by mouth daily.  . Testosterone (ANDROGEL) 20.25 MG/1.25GM (1.62%) GEL Apply 3 pumps on each arm daily (6) pumps total  . tiotropium (SPIRIVA) 18 MCG inhalation capsule Place 18 mcg into inhaler and inhale daily.   No facility-administered encounter medications on file as of 03/05/2015.   Allergies  Allergen Reactions  . Dust Mite Extract Other (See Comments)    Runny nose   . Pollen Extract Other (See Comments)    Runny nose   Patient Active Problem List   Diagnosis Date Noted  . Gastroenteritis 01/31/2015  . Orthopnea 10/27/2014  . Hypoxia 09/21/2014  . Chronic right shoulder pain 09/11/2014  . Type II diabetes mellitus, uncontrolled ([place]) 08/30/2014  . Diabetic peripheral neuropathy associated with type 2 diabetes mellitus ([place]) 08/30/2014  . HTN (hypertension) 08/16/2014  . Systolic murmur [id number]  . A-fib ([place]) 08/16/2014  . Acquired autoimmune hypothyroidism 08/15/2014  . COLD (chronic obstructive lung disease) ([place]) 08/15/2014  . Cardiac angina ([place]) 08/15/2014  . History of coronary artery disease 08/15/2014  . Major depressive disorder, recurrent episode, moderate ([place]) 08/15/2014   Social History   Social History  . Marital Status: Single    Spouse Name: N/A  . Number of Children: N/A  . Years of Education: N/A   Occupational History  . Not on file.   Social History Main Topics  . Smoking status: Former  Smoker -- 1.00 packs/day for 42 years    Types: Cigarettes    Quit date: 04/07/2001  . Smokeless tobacco: Not on file  . Alcohol Use: No  . Drug Use: Not on file  . Sexual Activity: Not on file   Other Topics Concern  . Not on file   Social History Narrative    Mr. Middaugh family history includes COPD in his father. There is no history of Diabetes.      Objective:    Filed Vitals:   03/05/15 0948  BP: 108/48  Pulse: 72    Physical Exam  well-developed elderly white male in no acute distress, pleasant blood pressure, ambulates with a walker 108/48 pulse 72 height 5 foot 11 weight 231. HEENT; nontraumatic normocephalic EOMI PERRLA sclera anicteric, Cardiovascular ;regular rate and rhythm with S1-S2 no murmur or gallop, Pulmonary ;decreased breath sounds bilaterally, Abdomen; large soft basically nontender there is no palpable mass or hepatosplenomegaly bowel  sounds are present no bruit, Rectal; exam not done, Extremities ;no clubbing cyanosis or edema skin warm and dry, Neuropsych; mood and affect appropriate       Assessment & Plan:   #1 79 yo male with 2-3 month hx of decreased appetite, abdominal bloating,gas and did have diarrhea which has now improved. Etiology not clear R/O  Small bowel Bacterial Overgrowth R/O underlying malignancy  #2 Hx of Esophageal Ca 2006-s/p esophagectomy.([facility] ) #2 AODM #3 Afib  #4 chronic anticoagulation - Eliquis #5 spinal stemosis #6CAD #7 COPD #8 CKD  Plan; Schedule for Ct abd/pelvis no IV Contrast  Start daily Align or Culturelle Obtain records from [facility] negative consider course of Xifaxan Pt will be established with Dr Silverio Decamp , follow up in one month Pt is asked  to call if diarrhea recurs     Amy Genia Harold PA-C 03/05/2015   Cc: Mackie Pai, PA-C

## 2015-03-07 ENCOUNTER — Ambulatory Visit (INDEPENDENT_AMBULATORY_CARE_PROVIDER_SITE_OTHER): Payer: Medicare Other | Admitting: Endocrinology

## 2015-03-07 ENCOUNTER — Telehealth: Payer: Self-pay | Admitting: *Deleted

## 2015-03-07 ENCOUNTER — Encounter: Payer: Self-pay | Admitting: Endocrinology

## 2015-03-07 VITALS — BP 140/72 | HR 64 | Temp 98.6°F | Resp 16 | Ht 71.0 in | Wt 229.4 lb

## 2015-03-07 DIAGNOSIS — E291 Testicular hypofunction: Secondary | ICD-10-CM

## 2015-03-07 DIAGNOSIS — E1165 Type 2 diabetes mellitus with hyperglycemia: Secondary | ICD-10-CM | POA: Diagnosis not present

## 2015-03-07 DIAGNOSIS — E038 Other specified hypothyroidism: Secondary | ICD-10-CM | POA: Diagnosis not present

## 2015-03-07 DIAGNOSIS — E063 Autoimmune thyroiditis: Secondary | ICD-10-CM

## 2015-03-07 DIAGNOSIS — Z794 Long term (current) use of insulin: Secondary | ICD-10-CM | POA: Diagnosis not present

## 2015-03-07 DIAGNOSIS — IMO0002 Reserved for concepts with insufficient information to code with codable children: Secondary | ICD-10-CM

## 2015-03-07 LAB — TESTOSTERONE: Testosterone: 176.98 ng/dL — ABNORMAL LOW (ref 300.00–890.00)

## 2015-03-07 NOTE — Progress Notes (Signed)
Patient ID: Ricky Baker, male   DOB: [birth year], 79 y.o.   MRN: [id number]           Reason for Appointment: Follow-up  for Type 2 Diabetes  Referring physician: Raiford Noble  History of Present Illness:          Date of diagnosis of type 2 diabetes mellitus:?  30 years         Background history:  The patient was on oral medications for the first few years of his diagnosis, not clear what control he had at that time After about 10 years because of his problem with esophageal cancer and not being able to eat normal food he was on insulin Subsequently this was continued, partly because of his kidney function not being normal and metformin being discontinued He thinks he has been on Lantus insulin for several years along with Novolog Details of his previous control is not available but apparently it had been dependent on his compliance  Recent history:   INSULIN regimen is : Novolog insulin 10-12 at breakfast and supper, 16 at lunch. Toujeo 60 units in am     He was seen in consultation in 5/16 when he was irregular with his insulin regimen and had not been motivated to take care of his diabetes.  His A1c was 9.2%  Since 8/16 he has been on Toujeo instead of Lantus With improved compliance overall and better management of his diabetes after instructions hear his A1c is now consistently better, recently 6.8  Current blood sugar patterns and problems identified:    He is checking his blood sugars but not as much as before and mostly in the mornings  He has very variable meal times and sleeping times and sometimes eats only 2 meals a day  Hypoglycemia: This has occurred occasionally in the afternoons mostly when he is over estimating how much Novolog he needs to take for his meals and will need smaller amount of carbohydrate  He still has some high readings after meals when he is eating more carbohydrate  He thinks some of his high fasting readings are related to eating snacks  late at night when he is not able to sleep  Otherwise FASTING blood sugars are generally under control  Readings at bedtime are relatively higher but not consistently and even has had a low sugar of 48 in the early evening  He thinks his blood sugars are more stable with Toujeo compared to Lantus  Hypoglycemia: As above, less recently with only one blood sugar below 65 and this was after lunch, reading 42  Oral hypoglycemic drugs the patient is taking are:   none     Compliance with the medical regimen: Fair to poor  Glucose monitoring:  done 1 up to 3 times a day on an average         Glucometer:  Accu-Chek Blood Glucose readings by time of day and averages from meter download:  Mean values apply above for all meters except median for One Touch  PRE-MEAL Fasting Lunch  afternoon   PCS/hs Overall  Glucose range: 73-198  91-213   54-198   46-201    Mean/median: 140    140    Self-care: The diet that the patient has been following is: None.  Eating out at restaurants up to 4-5 times a week, usually at fast food places Meal times: Breakfast: 9 AM Lunch: 1 pm Dinner: 6pm  Typical meal intake: Breakfast is cereal, sometimes boiled  eggs               Dietician visit, most recent: A few years ago               Exercise:  none, unable to do much activity because of his multiple medical problems and balance issues   Weight history: He has gradually lost about 40 pounds over the last year and a half   Wt Readings from Last 3 Encounters:  03/07/15 229 lb 6.4 oz (104.055 kg)  03/05/15 231 lb 6 oz (104.951 kg)  01/31/15 233 lb (105.688 kg)   Glycemic control: Last A1c was 8% or more in March    Lab Results  Component Value Date   HGBA1C 6.8* 02/21/2015   HGBA1C 6.6* 12/04/2014   HGBA1C 9.2* 08/30/2014   Lab Results  Component Value Date   LDLCALC 107 02/21/2015   CREATININE 2.77* 02/21/2015    OTHER medical problems are discussed in review of systems      Medication List         This list is accurate as of: 03/07/15  2:59 PM.  Always use your most recent med list.               ACCU-CHEK AVIVA PLUS test strip  Generic drug:  glucose blood  TEST AS DIRECTED THREE TIMES DAILY     amiodarone 200 MG tablet  Commonly known as:  PACERONE  Take 200 mg by mouth 2 (two) times daily.     apixaban 5 MG Tabs tablet  Commonly known as:  ELIQUIS  Take 5 mg by mouth 2 (two) times daily.     atorvastatin 80 MG tablet  Commonly known as:  LIPITOR  Take 1 tablet (80 mg total) by mouth daily.     BREO ELLIPTA IN  Inhale 1 puff into the lungs.     BREO ELLIPTA 100-25 MCG/INH Aepb  Generic drug:  Fluticasone Furoate-Vilanterol     buPROPion 300 MG 24 hr tablet  Commonly known as:  WELLBUTRIN XL  Take 1 tablet (300 mg total) by mouth daily.     diltiazem 240 MG 24 hr capsule  Commonly known as:  DILACOR XR  Take 240 mg by mouth daily.     doxercalciferol 2.5 MCG capsule  Commonly known as:  HECTOROL  Take 2.5 mcg by mouth daily.     DULoxetine 60 MG capsule  Commonly known as:  CYMBALTA  Take 1 capsule (60 mg total) by mouth daily.     furosemide 40 MG tablet  Commonly known as:  LASIX  Take 40-80 mg by mouth daily.     gabapentin 300 MG capsule  Commonly known as:  NEURONTIN  TAKE 2 CAPSULES BY MOUTH THREE TIMES DAILY     Insulin Glargine 300 UNIT/ML Sopn  Commonly known as:  TOUJEO SOLOSTAR  Inject 60 Units into the skin at bedtime.     Insulin Syringe-Needle U-100 31G X 5/16" 0.3 ML Misc  Commonly known as:  LITETOUCH INSULIN SYRINGE  Use as directed     ipratropium-albuterol 0.5-2.5 (3) MG/3ML Soln  Commonly known as:  DUONEB  Take 3 mLs by nebulization.     ketoconazole 2 % shampoo  Commonly known as:  NIZORAL  Apply 1 application topically 2 (two) times a week.     levothyroxine 112 MCG tablet  Commonly known as:  SYNTHROID, LEVOTHROID  Take 112 mcg by mouth daily before breakfast.     montelukast 10  MG tablet  Commonly known  as:  SINGULAIR  Take 10 mg by mouth at bedtime.     nebivolol 10 MG tablet  Commonly known as:  BYSTOLIC  Take 10 mg by mouth daily.     NOVOLOG FLEXPEN [place]  Inject into the skin. 01-20-09     NOVOLOG FLEXPEN 100 UNIT/ML FlexPen  Generic drug:  insulin aspart  INJECT 20 UNITS THREE TIMES DAILY AS DIRECTED     nystatin ointment  Commonly known as:  MYCOSTATIN  Apply 1 application topically 2 (two) times daily.     olopatadine 0.1 % ophthalmic solution  Commonly known as:  PATANOL  Place 1 drop into both eyes 2 (two) times daily as needed.     OXYGEN  Inhale into the lungs. 2L all day and night     ranolazine 500 MG 12 hr tablet  Commonly known as:  RANEXA  Take 500 mg by mouth 2 (two) times daily.     roflumilast 500 MCG Tabs tablet  Commonly known as:  DALIRESP  Take 500 mcg by mouth daily.     spironolactone 25 MG tablet  Commonly known as:  ALDACTONE  Take 25 mg by mouth daily.     Testosterone 20.25 MG/1.25GM (1.62%) Gel  Commonly known as:  ANDROGEL  Apply 3 pumps on each arm daily (6) pumps total     tiotropium 18 MCG inhalation capsule  Commonly known as:  SPIRIVA  Place 18 mcg into inhaler and inhale daily.     VITAMIN D2 PO  Take 50,000 Units by mouth every 30 (thirty) days.        Allergies:  Allergies  Allergen Reactions  . Dust Mite Extract Other (See Comments)    Runny nose   . Pollen Extract Other (See Comments)    Runny nose    Past Medical History  Diagnosis Date  . COPD (chronic obstructive pulmonary disease) ([place])   . CHF (congestive heart failure) ([place])   . Thyroid disease   . Chronic bronchitis ([place])   . Atrial fibrillation ([place])   . CKD (chronic kidney disease)   . Diabetes mellitus without complication ([place])   . Allergy   . Esophageal cancer ([facility])     esophogeal   . CKD (chronic kidney disease)   . Depression   . Hypertension   . Colon polyps   . CAD (coronary artery disease)   . Hyperlipidemia   . GERD  (gastroesophageal reflux disease)   . History of renal stone     Past Surgical History  Procedure Laterality Date  . Esophagus surgery  2006  . Spine surgery      bone spurs  . Cardiac catheterization  2009    two stents placed  . Tonsillectomy      Family History  Problem Relation Age of Onset  . COPD Father   . Diabetes Neg Hx     Social History:  reports that he quit smoking about 13 years ago. His smoking use included Cigarettes. He has a 42 pack-year smoking history. He does not have any smokeless tobacco history on file. He reports that he does not drink alcohol. His drug history is not on file.    Review of Systems    Lipid history: On Lipitor for several years, triglycerides and LDL have been high previously He is irregular with his Lipitor, he is only better compliant when he is using a pillbox for his medications    Lab Results  Component Value Date   CHOL 179 02/21/2015   HDL 31* 02/21/2015   LDLCALC 107 02/21/2015   LDLDIRECT 169.0 09/27/2014   TRIG 205* 02/21/2015   CHOLHDL 5.8* 02/21/2015            Eyes:   Most recent eye exam was 5/16 and reportedly normal, he saw an optometrist in [place]  Hypertension: Present for several years and treated with multiple drugs including diltiazem, Bystolic and Aldactone  Neurological:   Has long-standing numbness and pains or tingling in feet controlled with gabapentin  Foot exam in 5/16 showed the following: Pedal pulses absent, monofilament sensation absent except mild sensation present in the left distal toes  Psychiatric:  Has symptoms of depression treated with Cymbalta   Endocrine:  He has a history of thyroid disease for about 52 yrs and taking thyroid supplements, previously followed by endocrinologist.   He has been on the same dose for about 2 years and last TSH normal  Lab Results  Component Value Date   TSH 1.827 02/21/2015   TSH 1.78 08/30/2014   FREET4 1.02 08/30/2014    HYPOGONADISM: He  was treated by a urologist for about 6 years, previously was told to have hypogonadism when he was complaining of feeling lethargic and decreased libido.  Prior to restarting AndroGel his testosterone level was 90  He feels better with more motivation and less fatigue With the treatment However he is occasionally forgetting his medication, his last level was still below 300   Lab Results  Component Value Date   TESTOSTERONE 278.95* 12/04/2014    CKD: Followed by nephrologist  but notes are not available for review     No visits with results within 1 Week(s) from this visit. Latest known visit with results is:  Lab on 02/21/2015  Component Date Value Ref Range Status  . Sodium 02/21/2015 143  135 - 146 mmol/L Final  . Potassium 02/21/2015 4.2  3.5 - 5.3 mmol/L Final  . Chloride 02/21/2015 109  98 - 110 mmol/L Final  . CO2 02/21/2015 22  20 - 31 mmol/L Final  . Glucose, Bld 02/21/2015 124* 65 - 99 mg/dL Final  . BUN 02/21/2015 41* 7 - 25 mg/dL Final  . Creat 02/21/2015 2.77* 0.70 - 1.18 mg/dL Final  . Total Bilirubin 02/21/2015 0.5  0.2 - 1.2 mg/dL Final  . Alkaline Phosphatase 02/21/2015 141* 40 - 115 U/L Final  . AST 02/21/2015 11  10 - 35 U/L Final  . ALT 02/21/2015 8* 9 - 46 U/L Final  . Total Protein 02/21/2015 5.6* 6.1 - 8.1 g/dL Final  . Albumin 02/21/2015 3.6  3.6 - 5.1 g/dL Final  . Calcium 02/21/2015 8.1* 8.6 - 10.3 mg/dL Final  . Hgb A1c MFr Bld 02/21/2015 6.8* <5.7 % Final   Comment:                                                                        According to the ADA Clinical Practice Recommendations for 2011, when HbA1c is used as a screening test:     >=6.5%   Diagnostic of Diabetes Mellitus            (if abnormal result is confirmed)   5.7-6.4%  Increased risk of developing Diabetes Mellitus   References:Diagnosis and Classification of Diabetes Mellitus,Diabetes [id number] 1):S62-S69 and Standards of Medical Care in         Diabetes -  2011,Diabetes [id number] (Suppl 1):S11-S61.     . Mean Plasma Glucose 02/21/2015 148* <117 mg/dL Final  . Cholesterol 02/21/2015 179  125 - 200 mg/dL Final  . Triglycerides 02/21/2015 205* <150 mg/dL Final  . HDL 02/21/2015 31* >=40 mg/dL Final  . Total CHOL/HDL Ratio 02/21/2015 5.8* <=5.0 Ratio Final  . VLDL 02/21/2015 41* <30 mg/dL Final  . LDL Cholesterol 02/21/2015 107  <130 mg/dL Final   Comment:   Total Cholesterol/HDL Ratio:CHD Risk                        Coronary Heart Disease Risk Table                                        Men       Women          1/2 Average Risk              3.4        3.3              Average Risk              5.0        4.4           2X Average Risk              9.6        7.1           3X Average Risk             23.4       11.0 Use the calculated Patient Ratio above and the CHD Risk table  to determine the patient's CHD Risk.   Marland Kitchen TSH 02/21/2015 1.827  0.350 - 4.500 uIU/mL Final    Physical Examination:  BP 140/72 mmHg  Pulse 64  Temp(Src) 98.6 F (37 C)  Resp 16  Ht 5\' 11"  (1.803 m)  Wt 229 lb 6.4 oz (104.055 kg)  BMI 32.01 kg/m2  SpO2 97%   ASSESSMENT/PLAN :   Diabetes type 2, uncontrolled   See history of present illness for detailed discussion of his current management, blood sugar patterns and problems identified  He is currently on basal bolus insulin regimen. He has some fluctuation in his blood sugars mostly related to inconsistency in his diet and mealtimes He is also not adjusting his mealtime insulin appropriately sometimes when he is eating more or less carbohydrate.  Since most of his fasting readings are reasonably good he can continue same dose of Toujeo and take a stable dose He was advised to reduce his mealtime insulin to 8-10 units for low carbohydrate meals and probably needs to take 4-6 units for large snacks at night  HYPOGONADISM: He had marked decrease in testosterone levels and this is likely to be from  metabolic syndrome.  He is subjectively feeling much better with starting testosterone supplement and will check his level today He is currently taking relatively high dose of 6 pumps a of AndroGel  To have testosterone checked today and dose adjusted.  HYPERLIPIDEMIA: With his history of coronary artery disease he does need LDL to be below 70  Advised  him that he needs to take his Lipitor regularly which she has not been doing  CKD: His creatinine is relatively higher in the last 3 months, continue follow-up with nephrologist Unable to obtain urine specimen today, he has also had some urinary retention problems   Patient Instructions  Take all Meds regularly  Adjust Novolog based on Carbs  Check blood sugars on waking up   times a week Also check blood sugars about 2 hours after a meal and do this after different meals by rotation  Recommended blood sugar levels on waking up is 90-130 and about 2 hours after meal is 130-160  Please bring your blood sugar monitor to each visit, thank you    Counseling time on subjects discussed above is over 50% of today's 25 minute visit  Shanae Luo 03/07/2015, 2:59 PM   Note: This office note was prepared with Dragon voice recognition system technology. Any transcriptional errors that result from this process are unintentional.

## 2015-03-07 NOTE — Telephone Encounter (Signed)
Forwarded to [employer]. JG//CMA

## 2015-03-07 NOTE — Patient Instructions (Signed)
Take all Meds regularly  Adjust Novolog based on Carbs  Check blood sugars on waking up   times a week Also check blood sugars about 2 hours after a meal and do this after different meals by rotation  Recommended blood sugar levels on waking up is 90-130 and about 2 hours after meal is 130-160  Please bring your blood sugar monitor to each visit, thank you

## 2015-03-08 ENCOUNTER — Ambulatory Visit (INDEPENDENT_AMBULATORY_CARE_PROVIDER_SITE_OTHER)
Admission: RE | Admit: 2015-03-08 | Discharge: 2015-03-08 | Disposition: A | Discharge status: Home / Self Care | Payer: Medicare Other | Source: Ambulatory Visit | Attending: Physician Assistant | Admitting: Physician Assistant

## 2015-03-08 ENCOUNTER — Other Ambulatory Visit: Payer: Self-pay | Admitting: *Deleted

## 2015-03-08 DIAGNOSIS — Z8501 Personal history of malignant neoplasm of esophagus: Secondary | ICD-10-CM | POA: Diagnosis not present

## 2015-03-08 DIAGNOSIS — R140 Abdominal distension (gaseous): Secondary | ICD-10-CM

## 2015-03-08 DIAGNOSIS — R143 Flatulence: Secondary | ICD-10-CM

## 2015-03-08 DIAGNOSIS — R1084 Generalized abdominal pain: Secondary | ICD-10-CM

## 2015-03-08 DIAGNOSIS — R630 Anorexia: Secondary | ICD-10-CM | POA: Diagnosis not present

## 2015-03-08 LAB — MICROALBUMIN / CREATININE URINE RATIO
Creatinine,U: 179.3 mg/dL
MICROALB UR: 5.1 mg/dL — AB (ref 0.0–1.9)
Microalb Creat Ratio: 2.8 mg/g (ref 0.0–30.0)

## 2015-03-08 MED ORDER — TESTOSTERONE 20.25 MG/1.25GM (1.62%) TD GEL: TRANSDERMAL | Status: AC

## 2015-03-08 NOTE — Addendum Note (Signed)
Addended by: Kaylyn Lim I on: 03/08/2015 03:10 PM   Modules accepted: Orders

## 2015-03-09 ENCOUNTER — Ambulatory Visit: Payer: 59 | Admitting: Psychology

## 2015-03-12 ENCOUNTER — Encounter: Payer: Self-pay | Admitting: Physician Assistant

## 2015-03-12 DIAGNOSIS — C159 Malignant neoplasm of esophagus, unspecified: Secondary | ICD-10-CM | POA: Insufficient documentation

## 2015-03-12 DIAGNOSIS — N189 Chronic kidney disease, unspecified: Secondary | ICD-10-CM | POA: Insufficient documentation

## 2015-03-12 DIAGNOSIS — I350 Nonrheumatic aortic (valve) stenosis: Secondary | ICD-10-CM | POA: Insufficient documentation

## 2015-03-14 ENCOUNTER — Other Ambulatory Visit: Payer: Self-pay

## 2015-03-14 ENCOUNTER — Other Ambulatory Visit: Payer: Self-pay | Admitting: *Deleted

## 2015-03-14 ENCOUNTER — Telehealth: Payer: Self-pay | Admitting: Endocrinology

## 2015-03-14 MED ORDER — RIFAXIMIN 550 MG PO TABS: 550.0000 mg | ORAL_TABLET | Freq: Three times a day (TID) | ORAL | Status: DC

## 2015-03-14 MED ORDER — INSULIN GLARGINE 300 UNIT/ML ~~LOC~~ SOPN: 60.0000 [IU] | PEN_INJECTOR | Freq: Every morning | SUBCUTANEOUS | Status: DC

## 2015-03-14 NOTE — Telephone Encounter (Signed)
rx sent

## 2015-03-14 NOTE — Telephone Encounter (Signed)
Would like the insulin refilled by tomorrow AM he is getting low the insulin Toujeo  Please call into [facility]

## 2015-03-15 ENCOUNTER — Encounter: Payer: Self-pay | Admitting: Internal Medicine

## 2015-03-15 ENCOUNTER — Other Ambulatory Visit: Payer: Self-pay | Admitting: Physician Assistant

## 2015-03-16 ENCOUNTER — Telehealth: Payer: Self-pay

## 2015-03-16 NOTE — Telephone Encounter (Signed)
-----   Message from Alfredia Ferguson, PA-C sent at 03/12/2015 11:35 AM EST ----- Please let pt know the CT scan is negative except for a small nodule within his gastric pull up- he needs to be schedule for EGD with possible bx with Dr Silverio Decamp. Be sure he is not on anticoagulation. Also would like to give him a course of Xifaxan 550 mg TID x 14 days for bact Overgrowth.Marland Kitchen

## 2015-03-22 ENCOUNTER — Ambulatory Visit (INDEPENDENT_AMBULATORY_CARE_PROVIDER_SITE_OTHER): Payer: Medicare Other | Admitting: Cardiology

## 2015-03-22 ENCOUNTER — Encounter: Payer: Self-pay | Admitting: Cardiology

## 2015-03-22 ENCOUNTER — Telehealth: Payer: Self-pay | Admitting: *Deleted

## 2015-03-22 VITALS — BP 110/50 | HR 63 | Ht 71.0 in | Wt 224.0 lb

## 2015-03-22 DIAGNOSIS — E785 Hyperlipidemia, unspecified: Secondary | ICD-10-CM

## 2015-03-22 DIAGNOSIS — I5032 Chronic diastolic (congestive) heart failure: Secondary | ICD-10-CM

## 2015-03-22 DIAGNOSIS — I10 Essential (primary) hypertension: Secondary | ICD-10-CM | POA: Diagnosis not present

## 2015-03-22 DIAGNOSIS — I350 Nonrheumatic aortic (valve) stenosis: Secondary | ICD-10-CM | POA: Diagnosis not present

## 2015-03-22 DIAGNOSIS — I2510 Atherosclerotic heart disease of native coronary artery without angina pectoris: Secondary | ICD-10-CM

## 2015-03-22 DIAGNOSIS — R55 Syncope and collapse: Secondary | ICD-10-CM | POA: Diagnosis not present

## 2015-03-22 DIAGNOSIS — I2583 Coronary atherosclerosis due to lipid rich plaque: Secondary | ICD-10-CM

## 2015-03-22 MED ORDER — DILTIAZEM HCL ER COATED BEADS 120 MG PO CP24: 120.0000 mg | ORAL_CAPSULE | Freq: Every day | ORAL | Status: DC

## 2015-03-22 NOTE — Telephone Encounter (Signed)
Received a call from [place] at [facility].  They have verified the patient's address with him and are shipping the medication to him. Xifaxan 550 mg # 42 tablets, He is to take 3 tabl daily for 14 days.

## 2015-03-22 NOTE — Patient Instructions (Signed)
Medication Instructions:   START TAKING DILTIAZEM CD 120 MG ONCE DAILY    Testing/Procedures:  Your physician has requested that you have an echocardiogram. Echocardiography is a painless test that uses sound waves to create images of your heart. It provides your doctor with information about the size and shape of your heart and how well your heart's chambers and valves are working. This procedure takes approximately one hour. There are no restrictions for this procedure.  PER DR NELSON THIS NEEDS TO BE SCHEDULED ASAP FOR PRE-SYNCOPE AND AS.    Follow-Up:  Your physician wants you to follow-up in: [place] will receive a reminder letter in the mail two months in advance. If you don't receive a letter, please call our office to schedule the follow-up appointment.     If you need a refill on your cardiac medications before your next appointment, please call your pharmacy.

## 2015-03-22 NOTE — Progress Notes (Signed)
Patient ID: Ricky Baker, male   DOB: 1935-09-16, 79 y.o.   MRN: [id number]      Cardiology Office Note  Date:  03/22/2015   ID:  Ricky Baker, DOB 09-24-1935, MRN [id number]  PCP:  Leeanne Rio, PA-C  Cardiologist:   Dorothy Spark, MD   Chief complain: DOE, recurrent syncope   History of Present Illness:  Ricky Baker is a 79 y.o. male with prior medical history of coronary artery disease status post placement of 2 stents in 2013, COPD on home 2 at 2 liters, congestive heart failure, hypertension, IDDM, hyperlipidemia who has just moved to [place] couple weeks ago. The patient was followed in [facility] by Dr. Lyndel Safe. He states that prior to his stent placement he experience worsening dyspnea on exertion however never experienced chest pain. He noticed mild improvement of dyspnea after stent stent placement as he is on home O2 oxygen and has dyspnea almost all the time. The patient is also experiencing diabetic neuropathy and pain in his legs. He also has history of paroxysmal atrial fibrillation currently on Eliquis 5 mg orally twice a day. He has history of esophageal cancer status post partial esophagectomy, but no radiation. He is very compliant with his medications. He takes 40-80 mg of Lasix daily based on his weight as well as as well as spironolactone. His palpitations or syncope. No orthopnea or proximal nocturnal dyspnea.  03/22/15 - 6 months follow up, he has been doing well until recently when he developed diarrhea and was evaluatde by GI. He uses lasix PRN, no LE edema currently. His new problem is recurrent dizziness and presyncope with standing up and with walking. No chest pain, stable on and off SOB and DOE.  He passed out 3x yesterday. No loss of consciousness. No palpitations, no orthopnea or PND.  Past Medical History  Diagnosis Date  . COPD (chronic obstructive pulmonary disease) ([place])   . CHF (congestive heart failure) ([place])   . Thyroid  disease   . Chronic bronchitis ([place])   . Atrial fibrillation ([place])   . CKD (chronic kidney disease)   . Diabetes mellitus without complication ([place])   . Allergy   . Esophageal cancer (HCC)     esophogeal   . CKD (chronic kidney disease)   . Depression   . Hypertension   . Colon polyps   . CAD (coronary artery disease)   . Hyperlipidemia   . GERD (gastroesophageal reflux disease)   . History of renal stone    Past Surgical History  Procedure Laterality Date  . Esophagus surgery  2006  . Spine surgery      bone spurs  . Cardiac catheterization  2009    two stents placed  . Tonsillectomy     Current Outpatient Prescriptions  Medication Sig Dispense Refill  . ACCU-CHEK AVIVA PLUS test strip TEST AS DIRECTED THREE TIMES DAILY 100 each 5  . amiodarone (PACERONE) 200 MG tablet Take 200 mg by mouth 2 (two) times daily.    Marland Kitchen apixaban (ELIQUIS) 5 MG TABS tablet Take 5 mg by mouth 2 (two) times daily.    Marland Kitchen atorvastatin (LIPITOR) 80 MG tablet Take 1 tablet (80 mg total) by mouth daily. 30 tablet 3  . BREO ELLIPTA 100-25 MCG/INH AEPB     . buPROPion (WELLBUTRIN XL) 300 MG 24 hr tablet Take 1 tablet (300 mg total) by mouth daily. 30 tablet 2  . diltiazem (DILACOR XR) 240 MG 24 hr capsule Take  240 mg by mouth daily.    Marland Kitchen doxercalciferol (HECTOROL) 2.5 MCG capsule TABLET 1 CAPSULE BY MOUTH DAILY 90 capsule 0  . DULoxetine (CYMBALTA) 60 MG capsule Take 1 capsule (60 mg total) by mouth daily. 90 capsule 1  . Ergocalciferol (VITAMIN D2 PO) Take 50,000 Units by mouth every 30 (thirty) days.    . Fluticasone Furoate-Vilanterol (BREO ELLIPTA IN) Inhale 1 puff into the lungs.    . furosemide (LASIX) 40 MG tablet Take 40-80 mg by mouth daily.    Marland Kitchen gabapentin (NEURONTIN) 300 MG capsule TAKE 2 CAPSULES BY MOUTH THREE TIMES DAILY 540 capsule 0  . Insulin Aspart (NOVOLOG FLEXPEN [place]) Inject into the skin. 01-20-09    . Insulin Glargine (TOUJEO SOLOSTAR) 300 UNIT/ML SOPN Inject 60 Units into the skin  every morning. 4.5 mL 3  . Insulin Syringe-Needle U-100 (LITETOUCH INSULIN SYRINGE) 31G X 5/16" 0.3 ML MISC Use as directed 90 each 1  . ipratropium-albuterol (DUONEB) 0.5-2.5 (3) MG/3ML SOLN Take 3 mLs by nebulization.    Marland Kitchen ketoconazole (NIZORAL) 2 % shampoo Apply 1 application topically 2 (two) times a week. 120 mL 0  . levothyroxine (SYNTHROID, LEVOTHROID) 112 MCG tablet Take 112 mcg by mouth daily before breakfast.    . montelukast (SINGULAIR) 10 MG tablet Take 10 mg by mouth at bedtime.    . nebivolol (BYSTOLIC) 10 MG tablet Take 10 mg by mouth daily.    Marland Kitchen NOVOLOG FLEXPEN 100 UNIT/ML FlexPen INJECT 20 UNITS THREE TIMES DAILY AS DIRECTED 15 mL 3  . nystatin ointment (MYCOSTATIN) Apply 1 application topically 2 (two) times daily. 30 g 0  . olopatadine (PATANOL) 0.1 % ophthalmic solution Place 1 drop into both eyes 2 (two) times daily as needed. 5 mL 2  . OXYGEN Inhale into the lungs. 2L all day and night    . ranolazine (RANEXA) 500 MG 12 hr tablet Take 500 mg by mouth 2 (two) times daily.    . rifaximin (XIFAXAN) 550 MG TABS tablet Take 1 tablet (550 mg total) by mouth 3 (three) times daily. 42 tablet 0  . roflumilast (DALIRESP) 500 MCG TABS tablet Take 500 mcg by mouth daily.    Marland Kitchen spironolactone (ALDACTONE) 25 MG tablet Take 25 mg by mouth daily.    . Testosterone (ANDROGEL) 20.25 MG/1.25GM (1.62%) GEL Apply 3 pumps on each arm daily (6) pumps total 2.5 g 3  . tiotropium (SPIRIVA) 18 MCG inhalation capsule Place 18 mcg into inhaler and inhale daily.     No current facility-administered medications for this visit.   Allergies:   Dust mite extract and Pollen extract   Social History:  The patient  reports that he quit smoking about 13 years ago. His smoking use included Cigarettes. He has a 42 pack-year smoking history. He does not have any smokeless tobacco history on file. He reports that he does not drink alcohol.   Family History:  No FH of CAD or CHF.    ROS:  Please see the  history of present illness.   Otherwise, review of systems are positive for none.   All other systems are reviewed and negative.   PHYSICAL EXAM: VS:  BP 110/50 mmHg  Pulse 63  Ht 5\' 11"  (1.803 m)  Wt 224 lb (101.606 kg)  BMI 31.26 kg/m2  SpO2 92% , BMI Body mass index is 31.26 kg/(m^2). GEN: Well nourished, well developed, in no acute distress HEENT: normal Neck: no JVD, carotid bruits, or masses Cardiac: RRR; 4/6 holosystolic,  no S2 murmur, rubs, or gallops, no edema, 1+ PD, peripheral pulses Respiratory:  clear to auscultation bilaterally, normal work of breathing GI: soft, nontender, nondistended, + BS MS: no deformity or atrophy Skin: warm and dry, no rash Neuro:  Strength and sensation are intact Psych: euthymic mood, full affect  EKG:  EKG is ordered today. The ekg ordered today demonstrates SR, 1.AVB, otherwise normal  Recent Labs: 10/30/2014: Pro B Natriuretic peptide (BNP) 117.0* 01/25/2015: Hemoglobin 12.4*; Platelets 344.0 02/21/2015: ALT 8*; BUN 41*; Creat 2.77*; Potassium 4.2; Sodium 143; TSH 1.827   Lipid Panel    Component Value Date/Time   CHOL 179 02/21/2015 1004   TRIG 205* 02/21/2015 1004   HDL 31* 02/21/2015 1004   CHOLHDL 5.8* 02/21/2015 1004   VLDL 41* 02/21/2015 1004   LDLCALC 107 02/21/2015 1004   LDLDIRECT 169.0 09/27/2014 1005    Wt Readings from Last 3 Encounters:  03/22/15 224 lb (101.606 kg)  03/07/15 229 lb 6.4 oz (104.055 kg)  03/05/15 231 lb 6 oz (104.951 kg)      ASSESSMENT AND PLAN:  1. Aortic stenosis - moderate in 08/2014, now no S2, recurrent syncope, I will decrease cardizem from 240 to 120 mg po daily and repeat echocardiogram. If severe refer to TAVR clinic.  2. CAD, s/p 2 stents placement - stable DOE, ECG shows no acute changes, no ischemic workup at this Baker  3. CHF - chronic diastolic, the patient is euvolemic, we will continue the same regimen of lasix 40-80 mg po daily and spironolactone 25 mg po daily.   4.  Hypertension - controlled, too low and recurrent syncope, decrease cardizem as above  5. HLP - continue atorvastatin 20 mg po daily, we will check labs at the next visit.    6. Parox a-fib - on apixaban, we will continue, currently in SR on amiodarone, diltiazem, nebivolol.   Follow up in 6 months.  Signed, Dorothy Spark, MD  03/22/2015 11:18 AM    [facility] [address], [place], [place]  [address] Phone: [phone]; Fax: [phone]

## 2015-03-28 ENCOUNTER — Ambulatory Visit (HOSPITAL_BASED_OUTPATIENT_CLINIC_OR_DEPARTMENT_OTHER): Payer: Medicare Other

## 2015-03-28 ENCOUNTER — Ambulatory Visit (INDEPENDENT_AMBULATORY_CARE_PROVIDER_SITE_OTHER): Payer: 59 | Admitting: Psychology

## 2015-03-28 DIAGNOSIS — F331 Major depressive disorder, recurrent, moderate: Secondary | ICD-10-CM

## 2015-04-04 ENCOUNTER — Ambulatory Visit (HOSPITAL_BASED_OUTPATIENT_CLINIC_OR_DEPARTMENT_OTHER)
Admission: RE | Admit: 2015-04-04 | Discharge: 2015-04-04 | Disposition: A | Discharge status: Home / Self Care | Payer: Medicare Other | Source: Ambulatory Visit | Attending: Cardiology | Admitting: Cardiology

## 2015-04-04 DIAGNOSIS — I517 Cardiomegaly: Secondary | ICD-10-CM | POA: Insufficient documentation

## 2015-04-04 DIAGNOSIS — E119 Type 2 diabetes mellitus without complications: Secondary | ICD-10-CM | POA: Diagnosis not present

## 2015-04-04 DIAGNOSIS — I10 Essential (primary) hypertension: Secondary | ICD-10-CM | POA: Insufficient documentation

## 2015-04-04 DIAGNOSIS — Z87891 Personal history of nicotine dependence: Secondary | ICD-10-CM | POA: Diagnosis not present

## 2015-04-04 DIAGNOSIS — I350 Nonrheumatic aortic (valve) stenosis: Secondary | ICD-10-CM | POA: Insufficient documentation

## 2015-04-04 DIAGNOSIS — R55 Syncope and collapse: Secondary | ICD-10-CM | POA: Diagnosis not present

## 2015-04-04 MED ORDER — PERFLUTREN LIPID MICROSPHERE
1.0000 mL | INTRAVENOUS | Status: AC | PRN
  Administered 2015-04-04: 5 mL via INTRAVENOUS
  Filled 2015-04-04: qty 10

## 2015-04-04 MED FILL — Perflutren Lipid Microsphere IV Susp 1.1 MG/ML: INTRAVENOUS | Qty: 10 | Status: AC

## 2015-04-04 NOTE — Progress Notes (Signed)
Echocardiogram 2D Echocardiogram has been performed.  Ricky Baker 04/04/2015, 12:15 PM

## 2015-04-19 ENCOUNTER — Ambulatory Visit (INDEPENDENT_AMBULATORY_CARE_PROVIDER_SITE_OTHER): Payer: Medicare Other | Admitting: Pulmonary Disease

## 2015-04-19 ENCOUNTER — Encounter: Payer: Self-pay | Admitting: Pulmonary Disease

## 2015-04-19 VITALS — BP 118/63 | HR 72 | Temp 97.5°F | Ht 71.0 in | Wt 224.0 lb

## 2015-04-19 DIAGNOSIS — J9611 Chronic respiratory failure with hypoxia: Secondary | ICD-10-CM | POA: Diagnosis not present

## 2015-04-19 DIAGNOSIS — J449 Chronic obstructive pulmonary disease, unspecified: Secondary | ICD-10-CM | POA: Diagnosis not present

## 2015-04-19 NOTE — Patient Instructions (Addendum)
Portable concentrator order to [facility]

## 2015-04-19 NOTE — Assessment & Plan Note (Signed)
we will try to get him a portable concentrator

## 2015-04-19 NOTE — Progress Notes (Signed)
   Subjective:    Patient ID: Ricky Baker, male    DOB: [birth year], 80 y.o.   MRN: [id number]  HPI  80 year old heavy ex-smoker with  COPD. And chronic resp failure on O2 .    IOV 09/21/14  He  moved from [place] to be closer to his sister due to his failing health. He smoked about 40 pack years before he quit in 2004. He was maintained on a regimen of breo, Spiriva, DuoNeb's and Daliresp-by his pulmonologist in [place]. He is on oxygen since 2013 during activity and sleep He has a history of falls, was hospitalized for 5 days with broken ribs and underwent rehabilitation, now walks with a walker. He did pulmonary rehabilitation for 5 years. He has insulin requiring diabetes, and occasionally has low sugars. He is atrial fibrillation on amiodarone and apixaban He has congestive heart failure- and takes Lasix as needed. He had esophageal cancer in 2006 status post gastric pull-up surgery at [facility]   04/19/2015  Chief Complaint  Patient presents with  . Follow-up    Pt here for 5 month follow up. Pt c/o chest congestion, prod cough with clear mucus, PND, SOB and wheezing at times. Denies any sinus congestion, chest tightness, fever, nausea or vomiting.   5 month follow up   Says overall his breathing is doing okay , feels he is at his baseline.   Never got portable concentrator  On O2 at 2l/m 24/7 .  Remains on BREO, Spiriva and Daliresp.  Previously did pulm rehab. Says he trys to be active at home     Significant tests/ events  echo 08/2014 >> nml LVEF, mod AS , AVA 1.1 Spirometry  09/2014 >> severe airway obstruction, FEV1 47%, ratio 63, FVC 56% 09/2014 >> desat to 78% on walking in the office. 04/2015 desatn to 93 %  Review of Systems  neg for any significant sore throat, dysphagia, itching, sneezing, nasal congestion or excess/ purulent secretions, fever, chills, sweats, unintended wt loss, pleuritic or exertional cp, hempoptysis, orthopnea pnd or change in chronic leg  swelling. Also denies presyncope, palpitations, heartburn, abdominal pain, nausea, vomiting, diarrhea or change in bowel or urinary habits, dysuria,hematuria, rash, arthralgias, visual complaints, headache, numbness weakness or ataxia.     Objective:   Physical Exam  Gen. Pleasant, obese, in no distress ENT - no lesions, no post nasal drip Neck: No JVD, no thyromegaly, no carotid bruits Lungs: no use of accessory muscles, no dullness to percussion, decreased without rales or rhonchi  Cardiovascular: Rhythm regular, heart sounds  normal, no murmurs or gallops, no peripheral edema Musculoskeletal: No deformities, no cyanosis or clubbing , no tremors       Assessment & Plan:

## 2015-04-19 NOTE — Assessment & Plan Note (Signed)
Stay on breo and Spiriva

## 2015-04-25 ENCOUNTER — Ambulatory Visit (INDEPENDENT_AMBULATORY_CARE_PROVIDER_SITE_OTHER): Payer: 59 | Admitting: Psychology

## 2015-04-25 DIAGNOSIS — F331 Major depressive disorder, recurrent, moderate: Secondary | ICD-10-CM | POA: Diagnosis not present

## 2015-05-04 ENCOUNTER — Telehealth: Payer: Self-pay | Admitting: Endocrinology

## 2015-05-04 ENCOUNTER — Other Ambulatory Visit: Payer: Self-pay | Admitting: *Deleted

## 2015-05-04 ENCOUNTER — Other Ambulatory Visit: Payer: Self-pay | Admitting: Physician Assistant

## 2015-05-04 MED ORDER — INSULIN LISPRO 100 UNIT/ML (KWIKPEN): PEN_INJECTOR | SUBCUTANEOUS | Status: DC

## 2015-05-04 NOTE — Telephone Encounter (Signed)
Patient called stating that he would like to have Rhonda call him   Did not want to leave a detailed message   Thank you

## 2015-05-04 NOTE — Telephone Encounter (Signed)
Patient needed his medication changed to Humalog per insurance preference, rx sent, patient is aware

## 2015-05-07 ENCOUNTER — Ambulatory Visit (INDEPENDENT_AMBULATORY_CARE_PROVIDER_SITE_OTHER): Payer: Medicare Other | Admitting: Gastroenterology

## 2015-05-07 ENCOUNTER — Encounter: Payer: Self-pay | Admitting: Gastroenterology

## 2015-05-07 VITALS — BP 108/54 | HR 80 | Ht 71.0 in | Wt 230.1 lb

## 2015-05-07 DIAGNOSIS — R1013 Epigastric pain: Secondary | ICD-10-CM | POA: Diagnosis not present

## 2015-05-07 DIAGNOSIS — R938 Abnormal findings on diagnostic imaging of other specified body structures: Secondary | ICD-10-CM

## 2015-05-07 DIAGNOSIS — R9389 Abnormal findings on diagnostic imaging of other specified body structures: Secondary | ICD-10-CM

## 2015-05-07 MED ORDER — ONDANSETRON HCL 4 MG PO TABS: 4.0000 mg | ORAL_TABLET | Freq: Three times a day (TID) | ORAL | Status: AC | PRN

## 2015-05-07 MED ORDER — OMEPRAZOLE 20 MG PO CPDR
20.0000 mg | DELAYED_RELEASE_CAPSULE | Freq: Every day | ORAL | Status: DC
Start: 2015-05-07 — End: 2015-08-02

## 2015-05-07 NOTE — Patient Instructions (Signed)
We have scheduled you for your hospital procedure,seperate instructions We will contact you about holding your Eliquis

## 2015-05-07 NOTE — Progress Notes (Signed)
CHAP BALDIVIA    [id number]    [birth year]  Primary Care Physician:Martin, Sandria Manly, PA-C  Referring Physician: Brunetta Jeans, PA-C [address], [place] [id number]  Chief complaint:  Epigastric discomfort  HPI: 80 year old white male visit here for follow-up visit with abdominal discomfort, and weight loss. He moved to this area to be closer to his sister with his given his multiple medical issues . He has a 40-pack-year smoking history before he quit in 2004. Patient has history of esophageal cancer diagnosed in 2006, he is status post esophagectomy done at [facility].  He says his appetite has been decreased over the past few months and has ongoing bloating and discomfort though no pain as well as lots of flatulence and belching. He says he gets nauseated if he over eats but has not vomited. He had CT abdomen and pelvis which showed a questionable 1.5 cm nodule in the gastric pull through.   He had complained of diarrhea at his last visit but that has since resolved Per patient he was seen by Dr. Juanita Craver prior to 2006 and then has had GI evaluation in [facility] since that time. He says that he's had 3-4 colonoscopies and that the last one was negative and he was told he probably would not need another one. He thinks this was likely done in 2009.    Outpatient Encounter Prescriptions as of 05/07/2015  Medication Sig  . ACCU-CHEK AVIVA PLUS test strip TEST AS DIRECTED THREE TIMES DAILY  . amiodarone (PACERONE) 200 MG tablet Take 200 mg by mouth 2 (two) times daily.  Marland Kitchen apixaban (ELIQUIS) 5 MG TABS tablet Take 5 mg by mouth 2 (two) times daily.  Marland Kitchen atorvastatin (LIPITOR) 80 MG tablet Take 1 tablet (80 mg total) by mouth daily.  Marland Kitchen buPROPion (WELLBUTRIN XL) 300 MG 24 hr tablet Take 1 tablet (300 mg total) by mouth daily.  Marland Kitchen diltiazem (CARDIZEM CD) 120 MG 24 hr capsule Take 1 capsule (120 mg total) by mouth daily.  Marland Kitchen  doxercalciferol (HECTOROL) 2.5 MCG capsule TABLET 1 CAPSULE BY MOUTH DAILY  . DULoxetine (CYMBALTA) 60 MG capsule Take 1 capsule (60 mg total) by mouth daily.  . Ergocalciferol (VITAMIN D2 PO) Take 50,000 Units by mouth every 30 (thirty) days.  . Fluticasone Furoate-Vilanterol (BREO ELLIPTA IN) Inhale 1 puff into the lungs.  . furosemide (LASIX) 40 MG tablet Take 40-80 mg by mouth daily.  Marland Kitchen gabapentin (NEURONTIN) 300 MG capsule TAKE 2 CAPSULES BY MOUTH THREE TIMES DAILY  . Insulin Glargine (TOUJEO SOLOSTAR) 300 UNIT/ML SOPN Inject 60 Units into the skin every morning.  . insulin lispro (HUMALOG KWIKPEN) 100 UNIT/ML KiwkPen Inject 20 units three times a day.  . Insulin Syringe-Needle U-100 (LITETOUCH INSULIN SYRINGE) 31G X 5/16" 0.3 ML MISC Use as directed  . ipratropium-albuterol (DUONEB) 0.5-2.5 (3) MG/3ML SOLN Take 3 mLs by nebulization.  Marland Kitchen ketoconazole (NIZORAL) 2 % shampoo Apply 1 application topically 2 (two) times a week.  . levothyroxine (SYNTHROID, LEVOTHROID) 112 MCG tablet Take 112 mcg by mouth daily before breakfast.  . montelukast (SINGULAIR) 10 MG tablet Take 10 mg by mouth at bedtime.  . nebivolol (BYSTOLIC) 10 MG tablet Take 10 mg by mouth daily.  Marland Kitchen NOVOLOG FLEXPEN 100 UNIT/ML FlexPen INJECT 20 UNITS THREE TIMES DAILY AS DIRECTED (Patient taking differently: Take as directed. sliding scale)  . nystatin ointment (MYCOSTATIN) APPLY EXTERNALLY TO  THE AFFECTED AREA TWICE DAILY  . olopatadine (PATANOL) 0.1 % ophthalmic solution Place 1 drop into both eyes 2 (two) times daily as needed.  . OXYGEN Inhale into the lungs. 2L all day and night  . ranolazine (RANEXA) 500 MG 12 hr tablet Take 500 mg by mouth 2 (two) times daily.  . roflumilast (DALIRESP) 500 MCG TABS tablet Take 500 mcg by mouth daily.  Marland Kitchen spironolactone (ALDACTONE) 25 MG tablet Take 25 mg by mouth daily.  . Testosterone (ANDROGEL) 20.25 MG/1.25GM (1.62%) GEL Apply 3 pumps on each arm daily (6) pumps total  . tiotropium  (SPIRIVA) 18 MCG inhalation capsule Place 18 mcg into inhaler and inhale daily.  . [DISCONTINUED] rifaximin (XIFAXAN) 550 MG TABS tablet Take 1 tablet (550 mg total) by mouth 3 (three) times daily. (Patient not taking: Reported on 04/19/2015)   No facility-administered encounter medications on file as of 05/07/2015.    Allergies as of 05/07/2015 - Review Complete 05/07/2015  Allergen Reaction Noted  . Dust mite extract Other (See Comments) 08/11/2014  . Pollen extract Other (See Comments) 08/11/2014    Past Medical History  Diagnosis Date  . COPD (chronic obstructive pulmonary disease) ([place])   . CHF (congestive heart failure) ([place])   . Thyroid disease   . Chronic bronchitis ([place])   . Atrial fibrillation ([place])   . CKD (chronic kidney disease)   . Diabetes mellitus without complication ([place])   . Allergy   . Esophageal cancer (HCC)     esophogeal   . CKD (chronic kidney disease)   . Depression   . Hypertension   . Colon polyps   . CAD (coronary artery disease)   . Hyperlipidemia   . GERD (gastroesophageal reflux disease)   . History of renal stone     Past Surgical History  Procedure Laterality Date  . Esophagus surgery  2006  . Spine surgery      bone spurs  . Cardiac catheterization  2009    two stents placed  . Tonsillectomy      Family History  Problem Relation Age of Onset  . COPD Father   . Diabetes Neg Hx     Social History   Social History  . Marital Status: Single    Spouse Name: N/A  . Number of Children: N/A  . Years of Education: N/A   Occupational History  . Not on file.   Social History Main Topics  . Smoking status: Former Smoker -- 1.00 packs/day for 42 years    Types: Cigarettes    Quit date: 04/07/2001  . Smokeless tobacco: Not on file  . Alcohol Use: No  . Drug Use: Not on file  . Sexual Activity: Not on file   Other Topics Concern  . Not on file   Social History Narrative      Review of systems: Review of Systems    Constitutional: Negative for fever and chills.  HENT: Negative.   Eyes: Negative for blurred vision.  Respiratory: Negative for cough, shortness of breath and wheezing.   Cardiovascular: Negative for chest pain and palpitations.  Gastrointestinal: as per HPI Genitourinary: Negative for dysuria, urgency, frequency and hematuria.  Musculoskeletal: Negative for myalgias, back pain and joint pain.  Skin: Negative for itching and rash.  Neurological: Negative for dizziness, tremors, focal weakness, seizures and loss of consciousness.  Endo/Heme/Allergies: Negative for environmental allergies.  Psychiatric/Behavioral: Negative for depression, suicidal ideas and hallucinations.  All other systems reviewed and are negative.   Physical  Exam: Filed Vitals:   05/07/15 1323  BP: 108/54  Pulse: 80   Gen:      No acute distress HEENT:  EOMI, sclera anicteric Neck:     No masses; no thyromegaly Lungs:    Clear to auscultation bilaterally; normal respiratory effort CV:         Regular rate and rhythm; no murmurs Abd:      + bowel sounds; soft, non-tender; no palpable masses, no distension Ext:    No edema; adequate peripheral perfusion Skin:      Warm and dry; no rash Neuro: alert and oriented x 3 Psych: normal mood and affect  Data Reviewed:  CT abd & pelvis 03/2015 Evidence of prior gastric pull-through. At the level of the esophageal/gastric hiatus, there is a question 1.5 x 0.8 cm soft tissue nodule within the gastric pull-through. Suggest further evaluation with direct visualization given a history of prior esophageal carcinoma. No acute abnormality is identified in the abdomen and pelvis. Cholelithiasis. Right nephrolithiasis. Left kidney cyst.   Assessment and Plan/Recommendations: 80 year old male with 40 pack year history of smoking,  GERD,  COPD,  Esophageal cancer status post esophagectomy with gastric pull-through in 2006,  With findings on CT abdomen and pelvis of a  questionable 1.5 cm soft tissue nodule in the gastric pull-through.  We'll proceed with EGD for further evaluation with biopsies The risks and benefits as well as alternatives of endoscopic procedure(s) have been discussed and reviewed. All questions answered. The patient agrees to proceed.  Damaris Hippo , MD [phone] Mon-Fri 8a-5p [phone] after 5p, weekends, holidays

## 2015-05-08 ENCOUNTER — Telehealth: Payer: Self-pay | Admitting: *Deleted

## 2015-05-08 NOTE — Telephone Encounter (Signed)
Will route this clearance information per Dr Meda Coffee, to [place] with [facility], for her review and further follow-up with the pt.

## 2015-05-08 NOTE — Telephone Encounter (Signed)
He should stop taking eliquis the night before and the morning of the procedure (2 doses) and restart as soon as it is safe from bleeding risk standpoint - a GI doctor performing endoscopy should advise on that.

## 2015-05-08 NOTE — Telephone Encounter (Signed)
  05/08/2015   RE: Ricky Baker DOB: 08/19/1935 MRN: [id number]   Dear  Dr Meda Coffee,    We have scheduled the above patient for an endoscopic procedure. Our records show that he is on anticoagulation therapy.   Please advise as to how long the patient may come off his therapy of Eliquis prior to the procedure, which is scheduled for 05/16/2015.  Please fax back/ or route the completed form to [facility] at [phone].   Sincerely,    Genella Mech

## 2015-05-10 NOTE — Telephone Encounter (Signed)
Ok to hold Eliquis called patient to inform  Patient aware of details.

## 2015-05-11 ENCOUNTER — Telehealth: Payer: Self-pay | Admitting: Endocrinology

## 2015-05-11 NOTE — Telephone Encounter (Signed)
Linna Hoff, Nurse Case Manager with [facility] called inquiring about information regarding this patient. CB [phone] X [phone]

## 2015-05-15 ENCOUNTER — Encounter (HOSPITAL_COMMUNITY): Payer: Self-pay | Admitting: *Deleted

## 2015-05-15 NOTE — Progress Notes (Addendum)
Pt has A-fib history and 2 cardiac stents (since ? 2009) Pt on Eliquis, was instructed by Dr. Liane Comber to not take tonight's dose or tomorrow morning's dose. Pt denies chest pain or sob.   Pt is diabetic, last A1C was 6.8 in Nov. 2016. Pt states he's not been compliant taking his insulin and with his eating. States fasting blood sugar is running aroung 190-210. Instructed pt to take 50% of Toujeo Insulin in the AM which will be 30 units per Diabetes Medication Adjustment Guidelines Prior to Procedure and Surgery.  Echo - 04/04/15 - in EPIC EKG - 01/01/15 - in EPIC Cath - in [place], pt can't remember when. Not in Care Everywhere.

## 2015-05-16 ENCOUNTER — Ambulatory Visit (HOSPITAL_COMMUNITY)
Admission: RE | Admit: 2015-05-16 | Discharge: 2015-05-16 | Disposition: A | Discharge status: Home / Self Care | Payer: Medicare Other | Source: Ambulatory Visit | Attending: Gastroenterology | Admitting: Gastroenterology

## 2015-05-16 ENCOUNTER — Ambulatory Visit: Payer: 59 | Admitting: Psychology

## 2015-05-16 ENCOUNTER — Ambulatory Visit (HOSPITAL_COMMUNITY): Payer: Medicare Other | Admitting: Certified Registered Nurse Anesthetist

## 2015-05-16 ENCOUNTER — Encounter (HOSPITAL_COMMUNITY)
Admission: RE | Disposition: A | Discharge status: Home / Self Care | Payer: Self-pay | Source: Ambulatory Visit | Attending: Gastroenterology

## 2015-05-16 ENCOUNTER — Encounter (HOSPITAL_COMMUNITY): Payer: Self-pay | Admitting: *Deleted

## 2015-05-16 DIAGNOSIS — F329 Major depressive disorder, single episode, unspecified: Secondary | ICD-10-CM | POA: Insufficient documentation

## 2015-05-16 DIAGNOSIS — I509 Heart failure, unspecified: Secondary | ICD-10-CM | POA: Insufficient documentation

## 2015-05-16 DIAGNOSIS — R933 Abnormal findings on diagnostic imaging of other parts of digestive tract: Secondary | ICD-10-CM | POA: Insufficient documentation

## 2015-05-16 DIAGNOSIS — Z794 Long term (current) use of insulin: Secondary | ICD-10-CM | POA: Insufficient documentation

## 2015-05-16 DIAGNOSIS — I4891 Unspecified atrial fibrillation: Secondary | ICD-10-CM | POA: Insufficient documentation

## 2015-05-16 DIAGNOSIS — Z7901 Long term (current) use of anticoagulants: Secondary | ICD-10-CM | POA: Insufficient documentation

## 2015-05-16 DIAGNOSIS — Z955 Presence of coronary angioplasty implant and graft: Secondary | ICD-10-CM | POA: Insufficient documentation

## 2015-05-16 DIAGNOSIS — N189 Chronic kidney disease, unspecified: Secondary | ICD-10-CM | POA: Insufficient documentation

## 2015-05-16 DIAGNOSIS — Z87891 Personal history of nicotine dependence: Secondary | ICD-10-CM | POA: Insufficient documentation

## 2015-05-16 DIAGNOSIS — R9389 Abnormal findings on diagnostic imaging of other specified body structures: Secondary | ICD-10-CM

## 2015-05-16 DIAGNOSIS — I130 Hypertensive heart and chronic kidney disease with heart failure and stage 1 through stage 4 chronic kidney disease, or unspecified chronic kidney disease: Secondary | ICD-10-CM | POA: Insufficient documentation

## 2015-05-16 DIAGNOSIS — K219 Gastro-esophageal reflux disease without esophagitis: Secondary | ICD-10-CM | POA: Insufficient documentation

## 2015-05-16 DIAGNOSIS — Z86010 Personal history of colonic polyps: Secondary | ICD-10-CM | POA: Diagnosis not present

## 2015-05-16 DIAGNOSIS — Z79899 Other long term (current) drug therapy: Secondary | ICD-10-CM | POA: Diagnosis not present

## 2015-05-16 DIAGNOSIS — E785 Hyperlipidemia, unspecified: Secondary | ICD-10-CM | POA: Diagnosis not present

## 2015-05-16 DIAGNOSIS — I2510 Atherosclerotic heart disease of native coronary artery without angina pectoris: Secondary | ICD-10-CM | POA: Diagnosis not present

## 2015-05-16 DIAGNOSIS — K259 Gastric ulcer, unspecified as acute or chronic, without hemorrhage or perforation: Secondary | ICD-10-CM | POA: Insufficient documentation

## 2015-05-16 DIAGNOSIS — R1013 Epigastric pain: Secondary | ICD-10-CM | POA: Diagnosis not present

## 2015-05-16 DIAGNOSIS — Z8501 Personal history of malignant neoplasm of esophagus: Secondary | ICD-10-CM | POA: Diagnosis not present

## 2015-05-16 DIAGNOSIS — J449 Chronic obstructive pulmonary disease, unspecified: Secondary | ICD-10-CM | POA: Diagnosis not present

## 2015-05-16 DIAGNOSIS — E079 Disorder of thyroid, unspecified: Secondary | ICD-10-CM | POA: Insufficient documentation

## 2015-05-16 DIAGNOSIS — E1122 Type 2 diabetes mellitus with diabetic chronic kidney disease: Secondary | ICD-10-CM | POA: Diagnosis not present

## 2015-05-16 HISTORY — DX: Headache, unspecified: R51.9

## 2015-05-16 HISTORY — DX: Reserved for inherently not codable concepts without codable children: IMO0001

## 2015-05-16 HISTORY — DX: Headache: R51

## 2015-05-16 HISTORY — DX: Pneumonia, unspecified organism: J18.9

## 2015-05-16 HISTORY — DX: Other fecal abnormalities: R19.5

## 2015-05-16 HISTORY — DX: Benign prostatic hyperplasia without lower urinary tract symptoms: N40.0

## 2015-05-16 HISTORY — DX: Cerebral infarction, unspecified: I63.9

## 2015-05-16 HISTORY — DX: Anemia, unspecified: D64.9

## 2015-05-16 HISTORY — DX: Abdominal distension (gaseous): R14.0

## 2015-05-16 HISTORY — DX: Dyslexia and alexia: R48.0

## 2015-05-16 HISTORY — PX: ESOPHAGOGASTRODUODENOSCOPY (EGD) WITH PROPOFOL: SHX5813

## 2015-05-16 LAB — GLUCOSE, CAPILLARY: Glucose-Capillary: 123 mg/dL — ABNORMAL HIGH (ref 65–99)

## 2015-05-16 SURGERY — ESOPHAGOGASTRODUODENOSCOPY (EGD) WITH PROPOFOL
Anesthesia: Monitor Anesthesia Care

## 2015-05-16 MED ORDER — SODIUM CHLORIDE 0.9 % IV SOLN
INTRAVENOUS | Status: DC
Start: 2015-05-16 — End: 2015-05-16

## 2015-05-16 MED ORDER — METOCLOPRAMIDE HCL 5 MG PO TABS: 5.0000 mg | ORAL_TABLET | Freq: Two times a day (BID) | ORAL | Status: DC

## 2015-05-16 MED ORDER — PROPOFOL 500 MG/50ML IV EMUL
INTRAVENOUS | Status: DC | PRN
  Administered 2015-05-16: 100 ug/kg/min via INTRAVENOUS

## 2015-05-16 MED ORDER — LACTATED RINGERS IV SOLN
INTRAVENOUS | Status: DC
  Administered 2015-05-16: 1000 mL via INTRAVENOUS
  Administered 2015-05-16: 12:00:00 via INTRAVENOUS

## 2015-05-16 NOTE — Anesthesia Preprocedure Evaluation (Addendum)
Anesthesia Evaluation  Patient identified by MRN, date of birth, ID band  Reviewed: Allergy & Precautions, NPO status , Patient's Chart, lab work & pertinent test results  Airway Mallampati: II  TM Distance: >3 FB Neck ROM: Full    Dental   Pulmonary shortness of breath, pneumonia, former smoker,    breath sounds clear to auscultation       Cardiovascular hypertension, + angina + CAD, +CHF and + Orthopnea  + Valvular Problems/Murmurs  Rhythm:Regular Rate:Normal     Neuro/Psych    GI/Hepatic Neg liver ROS, GERD  ,  Endo/Other  diabetesHypothyroidism   Renal/GU Renal disease     Musculoskeletal   Abdominal   Peds  Hematology   Anesthesia Other Findings   Reproductive/Obstetrics                            Anesthesia Physical Anesthesia Plan  ASA: III  Anesthesia Plan: MAC   Post-op Pain Management:    Induction: Intravenous  Airway Management Planned: Simple Face Mask  Additional Equipment:   Intra-op Plan:   Post-operative Plan:   Informed Consent: I have reviewed the patients History and Physical, chart, labs and discussed the procedure including the risks, benefits and alternatives for the proposed anesthesia with the patient or authorized representative who has indicated his/her understanding and acceptance.   Dental advisory given  Plan Discussed with: CRNA and Anesthesiologist  Anesthesia Plan Comments:         Anesthesia Quick Evaluation

## 2015-05-16 NOTE — Op Note (Addendum)
[facility] [place], [address]   ENDOSCOPY PROCEDURE REPORT  PATIENT: Ricky Baker  MR#: [id number] BIRTHDATE: Jun 12, 1935 , 67  yrs. old GENDER: male ENDOSCOPIST: Harl Bowie, MD REFERRED BY:  Raiford Noble PA PROCEDURE DATE:  05/16/2015 PROCEDURE:  EGD w/ biopsy ASA CLASS:     Class IV INDICATIONS:  Abnormal findings on imaging Dyspepsia Epigastric discomfort. MEDICATIONS: Monitored anesthesia care TOPICAL ANESTHETIC: none  DESCRIPTION OF PROCEDURE: After the risks benefits and alternatives of the procedure were thoroughly explained, informed consent was obtained.  The Pentax Gastroscope [id number] endoscope was introduced through the mouth and advanced to the second portion of the duodenum , Without limitations.  The instrument was slowly withdrawn as the mucosa was fully examined.   Small ulcer at site of anastomosis of squamocolumnar mucosa  25cm. Also noted some irrgularity, multiple biopsies were obtained with some minimal oozing of blood.  Retained food in the gastric pull through.  Duodenal bulb and 2nd part of duodenum appeared normal. Retroflexion was not performed.     The scope was then withdrawn from the patient and the procedure completed.  COMPLICATIONS: There were no immediate complications.  ENDOSCOPIC IMPRESSION: Small ulcer at site of anastomosis of squamocolumnar mucosa  25cm. Also noted some irrgularity, multiple biopsies were obtained with some minimal oozing of blood.  Retained food in the gastric pull through.  Duodenal bulb and 2nd part of duodenum appeared normal  RECOMMENDATIONS: 1.  Await pathology results 2.  Avoid NSAIDS 3.  Anti-reflux regimen to be follow 4.  Continue PPI 5. Reglan 5mg  twice daily before meals 6. Can restart Ellquis tomorrow AM  REPEAT EXAM: 3 months  eSigned:  Harl Bowie, MD 05/16/2015 12:32 PM Revised: 05/16/2015 12:32 PM   CC: Kara Mead MD  PATIENT  NAME:  Ricky Baker, Ricky Baker MR#: [id number]

## 2015-05-16 NOTE — H&P (View-Only) (Signed)
Ricky Baker    [id number]    [birth year]  Primary Care Physician:Martin, Sandria Manly, PA-C  Referring Physician: Brunetta Jeans, PA-C [place], [place] [address]  Chief complaint:  Epigastric discomfort  HPI: 80 year old white male visit here for follow-up visit with abdominal discomfort, and weight loss. He moved to this area to be closer to his sister with his given his multiple medical issues . He has a 40-pack-year smoking history before he quit in 2004. Patient has history of esophageal cancer diagnosed in 2006, he is status post esophagectomy done at [facility].  He says his appetite has been decreased over the past few months and has ongoing bloating and discomfort though no pain as well as lots of flatulence and belching. He says he gets nauseated if he over eats but has not vomited. He had CT abdomen and pelvis which showed a questionable 1.5 cm nodule in the gastric pull through.   He had complained of diarrhea at his last visit but that has since resolved Per patient he was seen by Dr. Juanita Craver prior to 2006 and then has had GI evaluation in [facility] since that time. He says that he's had 3-4 colonoscopies and that the last one was negative and he was told he probably would not need another one. He thinks this was likely done in 2009.    Outpatient Encounter Prescriptions as of 05/07/2015  Medication Sig  . ACCU-CHEK AVIVA PLUS test strip TEST AS DIRECTED THREE TIMES DAILY  . amiodarone (PACERONE) 200 MG tablet Take 200 mg by mouth 2 (two) times daily.  Marland Kitchen apixaban (ELIQUIS) 5 MG TABS tablet Take 5 mg by mouth 2 (two) times daily.  Marland Kitchen atorvastatin (LIPITOR) 80 MG tablet Take 1 tablet (80 mg total) by mouth daily.  Marland Kitchen buPROPion (WELLBUTRIN XL) 300 MG 24 hr tablet Take 1 tablet (300 mg total) by mouth daily.  Marland Kitchen diltiazem (CARDIZEM CD) 120 MG 24 hr capsule Take 1 capsule (120 mg total) by mouth daily.  Marland Kitchen  doxercalciferol (HECTOROL) 2.5 MCG capsule TABLET 1 CAPSULE BY MOUTH DAILY  . DULoxetine (CYMBALTA) 60 MG capsule Take 1 capsule (60 mg total) by mouth daily.  . Ergocalciferol (VITAMIN D2 PO) Take 50,000 Units by mouth every 30 (thirty) days.  . Fluticasone Furoate-Vilanterol (BREO ELLIPTA IN) Inhale 1 puff into the lungs.  . furosemide (LASIX) 40 MG tablet Take 40-80 mg by mouth daily.  Marland Kitchen gabapentin (NEURONTIN) 300 MG capsule TAKE 2 CAPSULES BY MOUTH THREE TIMES DAILY  . Insulin Glargine (TOUJEO SOLOSTAR) 300 UNIT/ML SOPN Inject 60 Units into the skin every morning.  . insulin lispro (HUMALOG KWIKPEN) 100 UNIT/ML KiwkPen Inject 20 units three times a day.  . Insulin Syringe-Needle U-100 (LITETOUCH INSULIN SYRINGE) 31G X 5/16" 0.3 ML MISC Use as directed  . ipratropium-albuterol (DUONEB) 0.5-2.5 (3) MG/3ML SOLN Take 3 mLs by nebulization.  Marland Kitchen ketoconazole (NIZORAL) 2 % shampoo Apply 1 application topically 2 (two) times a week.  . levothyroxine (SYNTHROID, LEVOTHROID) 112 MCG tablet Take 112 mcg by mouth daily before breakfast.  . montelukast (SINGULAIR) 10 MG tablet Take 10 mg by mouth at bedtime.  . nebivolol (BYSTOLIC) 10 MG tablet Take 10 mg by mouth daily.  Marland Kitchen NOVOLOG FLEXPEN 100 UNIT/ML FlexPen INJECT 20 UNITS THREE TIMES DAILY AS DIRECTED (Patient taking differently: Take as directed. sliding scale)  . nystatin ointment (MYCOSTATIN) APPLY EXTERNALLY TO  THE AFFECTED AREA TWICE DAILY  . olopatadine (PATANOL) 0.1 % ophthalmic solution Place 1 drop into both eyes 2 (two) times daily as needed.  . OXYGEN Inhale into the lungs. 2L all day and night  . ranolazine (RANEXA) 500 MG 12 hr tablet Take 500 mg by mouth 2 (two) times daily.  . roflumilast (DALIRESP) 500 MCG TABS tablet Take 500 mcg by mouth daily.  Marland Kitchen spironolactone (ALDACTONE) 25 MG tablet Take 25 mg by mouth daily.  . Testosterone (ANDROGEL) 20.25 MG/1.25GM (1.62%) GEL Apply 3 pumps on each arm daily (6) pumps total  . tiotropium  (SPIRIVA) 18 MCG inhalation capsule Place 18 mcg into inhaler and inhale daily.  . [DISCONTINUED] rifaximin (XIFAXAN) 550 MG TABS tablet Take 1 tablet (550 mg total) by mouth 3 (three) times daily. (Patient not taking: Reported on 04/19/2015)   No facility-administered encounter medications on file as of 05/07/2015.    Allergies as of 05/07/2015 - Review Complete 05/07/2015  Allergen Reaction Noted  . Dust mite extract Other (See Comments) 08/11/2014  . Pollen extract Other (See Comments) 08/11/2014    Past Medical History  Diagnosis Date  . COPD (chronic obstructive pulmonary disease) ([place])   . CHF (congestive heart failure) ([place])   . Thyroid disease   . Chronic bronchitis ([place])   . Atrial fibrillation ([place])   . CKD (chronic kidney disease)   . Diabetes mellitus without complication ([place])   . Allergy   . Esophageal cancer ([facility])     esophogeal   . CKD (chronic kidney disease)   . Depression   . Hypertension   . Colon polyps   . CAD (coronary artery disease)   . Hyperlipidemia   . GERD (gastroesophageal reflux disease)   . History of renal stone     Past Surgical History  Procedure Laterality Date  . Esophagus surgery  2006  . Spine surgery      bone spurs  . Cardiac catheterization  2009    two stents placed  . Tonsillectomy      Family History  Problem Relation Age of Onset  . COPD Father   . Diabetes Neg Hx     Social History   Social History  . Marital Status: Single    Spouse Name: N/A  . Number of Children: N/A  . Years of Education: N/A   Occupational History  . Not on file.   Social History Main Topics  . Smoking status: Former Smoker -- 1.00 packs/day for 42 years    Types: Cigarettes    Quit date: 04/07/2001  . Smokeless tobacco: Not on file  . Alcohol Use: No  . Drug Use: Not on file  . Sexual Activity: Not on file   Other Topics Concern  . Not on file   Social History Narrative      Review of systems: Review of Systems    Constitutional: Negative for fever and chills.  HENT: Negative.   Eyes: Negative for blurred vision.  Respiratory: Negative for cough, shortness of breath and wheezing.   Cardiovascular: Negative for chest pain and palpitations.  Gastrointestinal: as per HPI Genitourinary: Negative for dysuria, urgency, frequency and hematuria.  Musculoskeletal: Negative for myalgias, back pain and joint pain.  Skin: Negative for itching and rash.  Neurological: Negative for dizziness, tremors, focal weakness, seizures and loss of consciousness.  Endo/Heme/Allergies: Negative for environmental allergies.  Psychiatric/Behavioral: Negative for depression, suicidal ideas and hallucinations.  All other systems reviewed and are negative.   Physical  Exam: Filed Vitals:   05/07/15 1323  BP: 108/54  Pulse: 80   Gen:      No acute distress HEENT:  EOMI, sclera anicteric Neck:     No masses; no thyromegaly Lungs:    Clear to auscultation bilaterally; normal respiratory effort CV:         Regular rate and rhythm; no murmurs Abd:      + bowel sounds; soft, non-tender; no palpable masses, no distension Ext:    No edema; adequate peripheral perfusion Skin:      Warm and dry; no rash Neuro: alert and oriented x 3 Psych: normal mood and affect  Data Reviewed:  CT abd & pelvis 03/2015 Evidence of prior gastric pull-through. At the level of the esophageal/gastric hiatus, there is a question 1.5 x 0.8 cm soft tissue nodule within the gastric pull-through. Suggest further evaluation with direct visualization given a history of prior esophageal carcinoma. No acute abnormality is identified in the abdomen and pelvis. Cholelithiasis. Right nephrolithiasis. Left kidney cyst.   Assessment and Plan/Recommendations: 80 year old male with 40 pack year history of smoking,  GERD,  COPD,  Esophageal cancer status post esophagectomy with gastric pull-through in 2006,  With findings on CT abdomen and pelvis of a  questionable 1.5 cm soft tissue nodule in the gastric pull-through.  We'll proceed with EGD for further evaluation with biopsies The risks and benefits as well as alternatives of endoscopic procedure(s) have been discussed and reviewed. All questions answered. The patient agrees to proceed.  Damaris Hippo , MD [phone] Mon-Fri 8a-5p [phone] after 5p, weekends, holidays

## 2015-05-16 NOTE — Interval H&P Note (Signed)
History and Physical Interval Note:  05/16/2015 11:50 AM  Ricky Baker  has presented today for surgery, with the diagnosis of Abdominal paiin  Abnormal CT   The various methods of treatment have been discussed with the patient and family. After consideration of risks, benefits and other options for treatment, the patient has consented to  Procedure(s): ESOPHAGOGASTRODUODENOSCOPY (EGD) WITH PROPOFOL (N/A) as a surgical intervention .  The patient's history has been reviewed, patient examined, no change in status, stable for surgery.  I have reviewed the patient's chart and labs.  Questions were answered to the patient's satisfaction.     [name]

## 2015-05-16 NOTE — Transfer of Care (Signed)
Immediate Anesthesia Transfer of Care Note  Patient: Ricky Baker  Procedure(s) Performed: Procedure(s): ESOPHAGOGASTRODUODENOSCOPY (EGD) WITH PROPOFOL (N/A)  Patient Location: Endoscopy Unit  Anesthesia Type:MAC  Level of Consciousness: awake, alert  and oriented  Airway & Oxygen Therapy: Patient Spontanous Breathing  Post-op Assessment: Report given to RN and Post -op Vital signs reviewed and stable  Post vital signs: Reviewed and stable  Last Vitals:  Filed Vitals:   05/16/15 1040  BP: 147/66  Pulse: 65  Temp: 36.7 C  Resp: 20    Complications: No apparent anesthesia complications

## 2015-05-16 NOTE — Discharge Instructions (Signed)

## 2015-05-17 ENCOUNTER — Encounter (HOSPITAL_COMMUNITY): Payer: Self-pay | Admitting: Gastroenterology

## 2015-05-18 ENCOUNTER — Encounter (HOSPITAL_COMMUNITY): Payer: Self-pay | Admitting: Gastroenterology

## 2015-05-18 ENCOUNTER — Other Ambulatory Visit: Payer: Self-pay

## 2015-05-18 ENCOUNTER — Encounter: Payer: Self-pay | Admitting: Gastroenterology

## 2015-05-18 MED ORDER — NYSTATIN 100000 UNIT/ML MT SUSP: 5.0000 mL | Freq: Four times a day (QID) | OROMUCOSAL | Status: DC

## 2015-05-18 NOTE — Anesthesia Postprocedure Evaluation (Signed)
Anesthesia Post Note  Patient: Ricky Baker  Procedure(s) Performed: Procedure(s) (LRB): ESOPHAGOGASTRODUODENOSCOPY (EGD) WITH PROPOFOL (N/A)  Anesthesia Post Evaluation  Last Vitals:  Filed Vitals:   05/16/15 1245 05/16/15 1255  BP: 123/58 132/62  Pulse: 62 61  Temp:    Resp: 19 14    Last Pain: There were no vitals filed for this visit.               EDWARDS,Lalia Loudon

## 2015-05-21 ENCOUNTER — Other Ambulatory Visit: Payer: Self-pay

## 2015-05-29 ENCOUNTER — Telehealth: Payer: Self-pay | Admitting: Physician Assistant

## 2015-05-29 MED ORDER — OLOPATADINE HCL 0.1 % OP SOLN
1.0000 [drp] | Freq: Two times a day (BID) | OPHTHALMIC | Status: AC | PRN
Start: 2015-05-29 — End: ?

## 2015-05-29 MED ORDER — IPRATROPIUM-ALBUTEROL 0.5-2.5 (3) MG/3ML IN SOLN: 3.0000 mL | Freq: Four times a day (QID) | RESPIRATORY_TRACT | Status: AC | PRN

## 2015-05-29 NOTE — Telephone Encounter (Signed)
Relation to [id number] Call back Fate: [facility] - [place], [place] - [address] AT [place] [phone] (Phone) [phone] (Fax)         Reason for call:  Patient requesting a refill ipratropium-albuterol (DUONEB) 0.5-2.5 (3) MG/3ML SOLN and olopatadine (PATANOL) 0.1 % ophthalmic solution

## 2015-05-29 NOTE — Telephone Encounter (Signed)
Meds sent

## 2015-05-30 ENCOUNTER — Ambulatory Visit (INDEPENDENT_AMBULATORY_CARE_PROVIDER_SITE_OTHER): Payer: 59 | Admitting: Psychology

## 2015-05-30 ENCOUNTER — Ambulatory Visit: Payer: 59 | Admitting: Psychology

## 2015-05-30 DIAGNOSIS — F331 Major depressive disorder, recurrent, moderate: Secondary | ICD-10-CM | POA: Diagnosis not present

## 2015-06-01 ENCOUNTER — Telehealth: Payer: Self-pay

## 2015-06-01 NOTE — Telephone Encounter (Signed)
Biopsy results in. Repeat procedure not indicated for 6 months. Originally planned for 3 months.  I have left message for the patient to call back. He does not need to come in for an office visit until July.

## 2015-06-04 ENCOUNTER — Encounter: Payer: Self-pay | Admitting: Endocrinology

## 2015-06-04 ENCOUNTER — Ambulatory Visit (INDEPENDENT_AMBULATORY_CARE_PROVIDER_SITE_OTHER): Payer: Medicare Other | Admitting: Endocrinology

## 2015-06-04 VITALS — BP 134/64 | HR 94 | Temp 98.2°F | Resp 16 | Ht 71.0 in | Wt 223.6 lb

## 2015-06-04 DIAGNOSIS — E118 Type 2 diabetes mellitus with unspecified complications: Secondary | ICD-10-CM | POA: Diagnosis not present

## 2015-06-04 DIAGNOSIS — IMO0002 Reserved for concepts with insufficient information to code with codable children: Secondary | ICD-10-CM

## 2015-06-04 DIAGNOSIS — E291 Testicular hypofunction: Secondary | ICD-10-CM

## 2015-06-04 DIAGNOSIS — Z794 Long term (current) use of insulin: Secondary | ICD-10-CM | POA: Diagnosis not present

## 2015-06-04 DIAGNOSIS — E038 Other specified hypothyroidism: Secondary | ICD-10-CM

## 2015-06-04 DIAGNOSIS — E1165 Type 2 diabetes mellitus with hyperglycemia: Secondary | ICD-10-CM | POA: Diagnosis not present

## 2015-06-04 DIAGNOSIS — E063 Autoimmune thyroiditis: Secondary | ICD-10-CM

## 2015-06-04 LAB — BASIC METABOLIC PANEL
BUN: 38 mg/dL — ABNORMAL HIGH (ref 6–23)
CO2: 23 mEq/L (ref 19–32)
Calcium: 9.3 mg/dL (ref 8.4–10.5)
Chloride: 108 mEq/L (ref 96–112)
Creatinine, Ser: 2.85 mg/dL — ABNORMAL HIGH (ref 0.40–1.50)
GFR: 22.98 mL/min — AB (ref >60.00)
GLUCOSE: 168 mg/dL — AB (ref 70–99)
POTASSIUM: 4.7 meq/L (ref 3.5–5.1)
SODIUM: 140 meq/L (ref 135–145)

## 2015-06-04 LAB — LIPID PANEL
Cholesterol: 165 mg/dL (ref 0–200)
HDL: 34.1 mg/dL — ABNORMAL LOW (ref >39.00)
NONHDL: 131.29
Total CHOL/HDL Ratio: 5
Triglycerides: 266 mg/dL — ABNORMAL HIGH (ref 0.0–149.0)
VLDL: 53.2 mg/dL — ABNORMAL HIGH (ref 0.0–40.0)

## 2015-06-04 LAB — TESTOSTERONE: TESTOSTERONE: 156.31 ng/dL — AB (ref 300.00–890.00)

## 2015-06-04 LAB — TSH: TSH: 1.24 u[IU]/mL (ref 0.35–4.50)

## 2015-06-04 LAB — LDL CHOLESTEROL, DIRECT: LDL DIRECT: 97 mg/dL

## 2015-06-04 LAB — POCT GLYCOSYLATED HEMOGLOBIN (HGB A1C): HEMOGLOBIN A1C: 7

## 2015-06-04 NOTE — Progress Notes (Signed)
Patient ID: Ricky Baker, male   DOB: [birth year], 80 y.o.   MRN: [id number]           Reason for Appointment: Follow-up  for Type 2 Diabetes  Referring physician: Raiford Noble  History of Present Illness:          Date of diagnosis of type 2 diabetes mellitus:?  30 years         Background history:  The patient was on oral medications for the first few years of his diagnosis, not clear what control he had at that time After about 10 years because of his problem with esophageal cancer and not being able to eat normal food he was on insulin Subsequently this was continued, partly because of his kidney function not being normal and metformin being discontinued He thinks he has been on Lantus insulin for several years along with Novolog Details of his previous control is not available but apparently it had been dependent on his compliance  Recent history:   INSULIN regimen is : Novolog insulin 10-12 at breakfast and supper, 16 at lunch. Toujeo 60 units in am     He was seen in consultation in 5/16 when he was irregular with his insulin regimen and had not been motivated to take care of his diabetes.  His A1c was 9.2% Since 8/16 he has been on Toujeo instead of Lantus A1c is now consistently better 7.0,  Previously 6.8  Current blood sugar patterns and problems identified:    He did not bring his monitor for download and not clear how or when he is checking his readings  He has very variable meal times and sleeping times  Again   He thinks his fasting blood sugars are generally fairly good but based on his compliance may be higher  Hypoglycemia: This has occurred  Only once when he had less intake at lunch and took Novolog with a pre-meal blood sugar of 90    Overall he thinks his blood sugars depend on how compliant he is with diet and as with not taking his insulin sometimes at meals  Hypoglycemia: As above, less recently with only one blood sugar below 65 and this was after  lunch, reading 42  Oral hypoglycemic drugs the patient is taking are:   none     Compliance with the medical regimen: Fair to poor  Glucose monitoring:  done 1 up to 3 times a day on an average         Glucometer:  Accu-Chek Blood Glucose readings  Not available today   Eating out at restaurants up to 4-5 times a week, some at fast food places Meal times: Breakfast: 9 AM Lunch: 1 pm Dinner: 6pm  Typical meal intake: Breakfast is cereal, sometimes boiled eggs               Dietician visit, most recent: A few years ago               Exercise:  none, unable to do much activity because of his multiple medical problems and balance issues   Weight history:    weight has come down slightly   Wt Readings from Last 3 Encounters:  06/04/15 223 lb 9.6 oz (101.424 kg)  05/07/15 230 lb 2 oz (104.384 kg)  04/19/15 224 lb (101.606 kg)   Glycemic control: Last A1c was 8% or more in March    Lab Results  Component Value Date   HGBA1C 7.0 06/04/2015  HGBA1C 6.8* 02/21/2015   HGBA1C 6.6* 12/04/2014   Lab Results  Component Value Date   MICROALBUR 5.1* 03/08/2015   LDLCALC 107 02/21/2015   CREATININE 2.77* 02/21/2015    OTHER medical problems are discussed in review of systems      Medication List       This list is accurate as of: 06/04/15  2:55 PM.  Always use your most recent med list.               ACCU-CHEK AVIVA PLUS test strip  Generic drug:  glucose blood  TEST AS DIRECTED THREE TIMES DAILY     amiodarone 200 MG tablet  Commonly known as:  PACERONE  Take 200 mg by mouth 2 (two) times daily.     apixaban 5 MG Tabs tablet  Commonly known as:  ELIQUIS  Take 5 mg by mouth 2 (two) times daily.     atorvastatin 80 MG tablet  Commonly known as:  LIPITOR  Take 1 tablet (80 mg total) by mouth daily.     BREO ELLIPTA IN  Inhale 1 puff into the lungs.     buPROPion 300 MG 24 hr tablet  Commonly known as:  WELLBUTRIN XL  Take 1 tablet (300 mg total) by mouth daily.      diltiazem 120 MG 24 hr capsule  Commonly known as:  CARDIZEM CD  Take 1 capsule (120 mg total) by mouth daily.     doxercalciferol 2.5 MCG capsule  Commonly known as:  HECTOROL  TABLET 1 CAPSULE BY MOUTH DAILY     DULoxetine 60 MG capsule  Commonly known as:  CYMBALTA  Take 1 capsule (60 mg total) by mouth daily.     furosemide 40 MG tablet  Commonly known as:  LASIX  Take 40-80 mg by mouth daily.     gabapentin 300 MG capsule  Commonly known as:  NEURONTIN  TAKE 2 CAPSULES BY MOUTH THREE TIMES DAILY     Insulin Glargine 300 UNIT/ML Sopn  Commonly known as:  TOUJEO SOLOSTAR  Inject 60 Units into the skin every morning.     insulin lispro 100 UNIT/ML KiwkPen  Commonly known as:  HUMALOG KWIKPEN  Inject 20 units three times a day.     Insulin Syringe-Needle U-100 31G X 5/16" 0.3 ML Misc  Commonly known as:  LITETOUCH INSULIN SYRINGE  Use as directed     ipratropium-albuterol 0.5-2.5 (3) MG/3ML Soln  Commonly known as:  DUONEB  Take 3 mLs by nebulization every 6 (six) hours as needed.     ketoconazole 2 % shampoo  Commonly known as:  NIZORAL  Apply 1 application topically 2 (two) times a week.     levothyroxine 112 MCG tablet  Commonly known as:  SYNTHROID, LEVOTHROID  Take 112 mcg by mouth daily before breakfast.     metoCLOPramide 5 MG tablet  Commonly known as:  REGLAN  Take 1 tablet (5 mg total) by mouth 2 (two) times daily before lunch and supper.     montelukast 10 MG tablet  Commonly known as:  SINGULAIR  Take 10 mg by mouth at bedtime.     nebivolol 10 MG tablet  Commonly known as:  BYSTOLIC  Take 10 mg by mouth daily.     NOVOLOG FLEXPEN 100 UNIT/ML FlexPen  Generic drug:  insulin aspart  INJECT 20 UNITS THREE TIMES DAILY AS DIRECTED     nystatin 100000 UNIT/ML suspension  Commonly known as:  MYCOSTATIN  Take  5 mLs (500,000 Units total) by mouth 4 (four) times daily.     nystatin ointment  Commonly known as:  MYCOSTATIN  APPLY EXTERNALLY  TO THE AFFECTED AREA TWICE DAILY     olopatadine 0.1 % ophthalmic solution  Commonly known as:  PATANOL  Place 1 drop into both eyes 2 (two) times daily as needed.     omeprazole 20 MG capsule  Commonly known as:  PRILOSEC  Take 1 capsule (20 mg total) by mouth daily.     ondansetron 4 MG tablet  Commonly known as:  ZOFRAN  Take 1 tablet (4 mg total) by mouth every 8 (eight) hours as needed for nausea or vomiting.     OXYGEN  Inhale into the lungs. 2L all day and night     ranolazine 500 MG 12 hr tablet  Commonly known as:  RANEXA  Take 500 mg by mouth 2 (two) times daily.     roflumilast 500 MCG Tabs tablet  Commonly known as:  DALIRESP  Take 500 mcg by mouth daily.     spironolactone 25 MG tablet  Commonly known as:  ALDACTONE  Take 25 mg by mouth daily.     Testosterone 20.25 MG/1.25GM (1.62%) Gel  Commonly known as:  ANDROGEL  Apply 3 pumps on each arm daily (6) pumps total     tiotropium 18 MCG inhalation capsule  Commonly known as:  SPIRIVA  Place 18 mcg into inhaler and inhale daily.     VITAMIN D2 PO  Take 50,000 Units by mouth every 30 (thirty) days.        Allergies:  Allergies  Allergen Reactions  . Dust Mite Extract Other (See Comments)    Runny nose   . Pollen Extract Other (See Comments)    Runny nose    Past Medical History  Diagnosis Date  . COPD (chronic obstructive pulmonary disease) ([place])   . CHF (congestive heart failure) ([place])   . Thyroid disease   . Chronic bronchitis ([place])   . Atrial fibrillation ([place])   . CKD (chronic kidney disease)   . Diabetes mellitus without complication ([place])   . Allergy   . Esophageal cancer ([facility])     esophogeal   . CKD (chronic kidney disease)   . Depression   . Hypertension   . Colon polyps   . CAD (coronary artery disease)   . Hyperlipidemia   . GERD (gastroesophageal reflux disease)   . History of renal stone   . Stroke [facility])     ? TIA per pt  . Shortness of breath dyspnea     with exertion   . Pneumonia   . Dyslexia   . Enlarged prostate   . Headache     hx of - none in years  . Anemia   . Loose stools   . Flatulence/gas pain/belching     Past Surgical History  Procedure Laterality Date  . Esophagus surgery  2006  . Spine surgery      bone spurs  . Cardiac catheterization  2009    two stents placed  . Tonsillectomy    . Eye surgery Bilateral     cataract surgery with lens implants  . Colonoscopy    . Esophagogastroduodenoscopy (egd) with propofol N/A 05/16/2015    Procedure: ESOPHAGOGASTRODUODENOSCOPY (EGD) WITH PROPOFOL;  Surgeon: Mauri Pole, MD;  Location: [facility];  Service: Endoscopy;  Laterality: N/A;    Family History  Problem Relation Age of Onset  . COPD  Father   . Diabetes Neg Hx     Social History:  reports that he quit smoking about 14 years ago. His smoking use included Cigarettes. He has a 42 pack-year smoking history. He has never used smokeless tobacco. He reports that he does not drink alcohol or use illicit drugs.    Review of Systems    Lipid history: On Lipitor for several years, triglycerides and LDL have been high previously    Lab Results  Component Value Date   CHOL 179 02/21/2015   HDL 31* 02/21/2015   LDLCALC 107 02/21/2015   LDLDIRECT 169.0 09/27/2014   TRIG 205* 02/21/2015   CHOLHDL 5.8* 02/21/2015            Eyes:   Most recent eye exam was 5/16 and reportedly normal, he saw an optometrist in [place]  Hypertension: Present for several years and treated with multiple drugs including diltiazem, Bystolic and Aldactone  Neurological:   Has long-standing numbness and pains or tingling in feet controlled with gabapentin  Foot exam in 5/16 showed the following: Pedal pulses absent, monofilament sensation absent except mild sensation present in the left distal toes  Psychiatric:  Has symptoms of depression treated with Cymbalta, this was stopped because of renal dysfunction and is feeling more depressed    Endocrine:  He has a history of thyroid disease for about 52 yrs and taking thyroid supplements, previously followed by endocrinologist.   He has been on the same dose for about 2 years and last TSH normal  Lab Results  Component Value Date   TSH 1.827 02/21/2015   TSH 1.78 08/30/2014   FREET4 1.02 08/30/2014    HYPOGONADISM: He was treated by a urologist for about 6 years, previously was told to have hypogonadism when he was complaining of feeling lethargic and decreased libido.   Prior to restarting AndroGel his testosterone level was 90  He feels better with more motivation and less fatigue with taking this However he is occasionally forgetting his medication, his last level was still below 200  He may forget to take his AndroGel in the morning at times and he thinks he is taking it about 5 days a week   Lab Results  Component Value Date   TESTOSTERONE 176.98* 03/07/2015    CKD: Followed by nephrologist       Physical Examination:  BP 134/64 mmHg  Pulse 94  Temp(Src) 98.2 F (36.8 C)  Resp 16  Ht 5\' 11"  (1.803 m)  Wt 223 lb 9.6 oz (101.424 kg)  BMI 31.20 kg/m2  SpO2 96%   ASSESSMENT/PLAN :   Diabetes type 2, uncontrolled   See history of present illness for detailed discussion of his current management, blood sugar patterns and problems identified  Without any blood sugar readings to be reviewed today difficult guarded 5 blood sugar patterns. He is still expressing difficulty with being consistent with diet and insulin especially with going off his diet because of depression However A1c is still reasonably good at 7% He is avoiding hypoglycemia usually  Does not changes insulin regimen as yet but Review his glucose again with his meter on the next visit Hopefully with improvement of his depression this spring he will be better compliant  HYPOGONADISM:   He has difficulty with compliance with his testosterone supplement Usually has good results  objectively when he takes enough testosterone and has had no adverse effects from this Discussed options of using injections or Testopel but he is not quite sure  of this yet.  Discussed how this is different and need for urology referral Will check his level today and decide on further management   Hyperlipidemia: Continue follow-up with PCP  Depression: He will discuss possibly using a lower dose of Cymbalta for his depression considering his renal function   Patient Instructions  Use Androgel daily  Check blood sugars on waking up  4 times a week Also check blood sugars about 2 hours after a meal and do this after different meals by rotation  Recommended blood sugar levels on waking up is 90-130 and about 2 hours after meal is 130-160  Please bring your blood sugar monitor to each visit, thank you     Counseling time on subjects discussed above is over 50% of today's 25 minute visit  Ricky Baker 06/04/2015, 2:55 PM   Note: This office note was prepared with Dragon voice recognition system technology. Any transcriptional errors that result from this process are unintentional.

## 2015-06-04 NOTE — Patient Instructions (Addendum)
Use Androgel daily  Check blood sugars on waking up  4 times a week Also check blood sugars about 2 hours after a meal and do this after different meals by rotation  Recommended blood sugar levels on waking up is 90-130 and about 2 hours after meal is 130-160  Please bring your blood sugar monitor to each visit, thank you

## 2015-06-05 NOTE — Progress Notes (Signed)
Quick Note:  Please let patient know that the Testosterone level is still very low, he needs to decide on injections or going to the urologist To get the Testopel pellet ______

## 2015-06-20 ENCOUNTER — Ambulatory Visit (INDEPENDENT_AMBULATORY_CARE_PROVIDER_SITE_OTHER): Payer: Medicare Other | Admitting: Family Medicine

## 2015-06-20 ENCOUNTER — Ambulatory Visit (INDEPENDENT_AMBULATORY_CARE_PROVIDER_SITE_OTHER): Payer: 59 | Admitting: Psychology

## 2015-06-20 ENCOUNTER — Encounter: Payer: Self-pay | Admitting: Family Medicine

## 2015-06-20 VITALS — BP 142/70 | HR 80 | Temp 97.8°F | Wt 224.4 lb

## 2015-06-20 DIAGNOSIS — J189 Pneumonia, unspecified organism: Secondary | ICD-10-CM

## 2015-06-20 DIAGNOSIS — N12 Tubulo-interstitial nephritis, not specified as acute or chronic: Secondary | ICD-10-CM | POA: Diagnosis not present

## 2015-06-20 DIAGNOSIS — Z09 Encounter for follow-up examination after completed treatment for conditions other than malignant neoplasm: Secondary | ICD-10-CM

## 2015-06-20 DIAGNOSIS — F331 Major depressive disorder, recurrent, moderate: Secondary | ICD-10-CM | POA: Diagnosis not present

## 2015-06-20 LAB — COMPREHENSIVE METABOLIC PANEL
ALK PHOS: 105 U/L (ref 39–117)
ALT: 14 U/L (ref 0–53)
AST: 15 U/L (ref 0–37)
Albumin: 3.3 g/dL — ABNORMAL LOW (ref 3.5–5.2)
BUN: 41 mg/dL — AB (ref 6–23)
CO2: 20 mEq/L (ref 19–32)
CREATININE: 2.97 mg/dL — AB (ref 0.40–1.50)
Calcium: 8.8 mg/dL (ref 8.4–10.5)
Chloride: 111 mEq/L (ref 96–112)
GFR: 21.91 mL/min — ABNORMAL LOW (ref >60.00)
GLUCOSE: 175 mg/dL — AB (ref 70–99)
POTASSIUM: 4.9 meq/L (ref 3.5–5.1)
Sodium: 139 mEq/L (ref 135–145)
TOTAL PROTEIN: 6.3 g/dL (ref 6.0–8.3)
Total Bilirubin: 0.5 mg/dL (ref 0.2–1.2)

## 2015-06-20 LAB — CBC
HEMATOCRIT: 33.1 % — AB (ref 39.0–52.0)
HEMOGLOBIN: 11 g/dL — AB (ref 13.0–17.0)
MCHC: 33.2 g/dL (ref 30.0–36.0)
MCV: 91.3 fl (ref 78.0–100.0)
PLATELETS: 466 10*3/uL — AB (ref 150.0–400.0)
RBC: 3.63 Mil/uL — AB (ref 4.22–5.81)
RDW: 14.4 % (ref 11.5–15.5)
WBC: 11.2 10*3/uL — ABNORMAL HIGH (ref 4.0–10.5)

## 2015-06-20 NOTE — Progress Notes (Signed)
[facility] [address], [place], [place] [address] [phone]  Date:  06/20/2015   Name:  Ricky Baker   DOB:  [birth year]   MRN:  [id number]  PCP:  Leeanne Rio, PA-C    Chief Complaint: Hospitalization Follow-up   History of Present Illness:  Ricky Baker is a 80 y.o. very pleasant male patient who presents with the following:  History of CKD, COPD, DM, a fib.  He was admitted from 3/10- 3/13 with sepsis and pneumonia while in [place].  He noted he was not feeling well and had a temp of 102. His family had him go to the ER and he was admitted.  He was treated IV with rocephin and zithromax.  He came home yesterday evening.  He was supposed to fill an rx for augmentin but has not yet done so. He was also given an rx for eliquis but he is already on this medication for a fib so dhe does not need this rx He feels much better than he did but still tired and not yet back at baseline   He is able to cough up some material at this point- clear mucus They have not noted any fevers at home He is not really eating much- he does not have much appetite still.   He has not checked his glucose the last couple of days but does have a meter to do so  He has paroxymal a fib He is here with his sister today He is retired, has lived in [place] for many years   Patient Active Problem List   Diagnosis Date Noted  . Pre-syncope 03/22/2015  . Aortic stenosis 03/12/2015  . CKD (chronic kidney disease) 03/12/2015  . Esophageal carcinoma ([place]) 03/12/2015  . Gastroenteritis 01/31/2015  . Orthopnea 10/27/2014  . Chronic respiratory failure ([place]) 09/21/2014  . Chronic right shoulder pain 09/11/2014  . Type II diabetes mellitus, uncontrolled ([place]) 08/30/2014  . Diabetic peripheral neuropathy associated with type 2 diabetes mellitus ([place]) 08/30/2014  . HTN (hypertension) 08/16/2014  . Systolic murmur [id number]  . A-fib ([place])  08/16/2014  . Acquired autoimmune hypothyroidism 08/15/2014  . COLD (chronic obstructive lung disease) ([place]) 08/15/2014  . Cardiac angina ([place]) 08/15/2014  . History of coronary artery disease 08/15/2014  . Major depressive disorder, recurrent episode, moderate ([place]) 08/15/2014    Past Medical History  Diagnosis Date  . COPD (chronic obstructive pulmonary disease) ([place])   . CHF (congestive heart failure) ([place])   . Thyroid disease   . Chronic bronchitis ([place])   . Atrial fibrillation ([place])   . CKD (chronic kidney disease)   . Diabetes mellitus without complication ([place])   . Allergy   . Esophageal cancer ([facility])     esophogeal   . CKD (chronic kidney disease)   . Depression   . Hypertension   . Colon polyps   . CAD (coronary artery disease)   . Hyperlipidemia   . GERD (gastroesophageal reflux disease)   . History of renal stone   . Stroke [facility])     ? TIA per pt  . Shortness of breath dyspnea     with exertion  . Pneumonia   . Dyslexia   . Enlarged prostate   . Headache     hx of - none in years  . Anemia   . Loose stools   . Flatulence/gas pain/belching     Past Surgical History  Procedure Laterality Date  . Esophagus surgery  2006  . Spine surgery      bone spurs  . Cardiac catheterization  2009    two stents placed  . Tonsillectomy    . Eye surgery Bilateral     cataract surgery with lens implants  . Colonoscopy    . Esophagogastroduodenoscopy (egd) with propofol N/A 05/16/2015    Procedure: ESOPHAGOGASTRODUODENOSCOPY (EGD) WITH PROPOFOL;  Surgeon: Mauri Pole, MD;  Location: [facility];  Service: Endoscopy;  Laterality: N/A;    Social History  Substance Use Topics  . Smoking status: Former Smoker -- 1.00 packs/day for 42 years    Types: Cigarettes    Quit date: 04/07/2001  . Smokeless tobacco: Never Used  . Alcohol Use: No    Family History  Problem Relation Age of Onset  . COPD Father   . Diabetes Neg Hx     Allergies  Allergen Reactions   . Dust Mite Extract Other (See Comments)    Runny nose   . Pollen Extract Other (See Comments)    Runny nose    Medication list has been reviewed and updated.  Current Outpatient Prescriptions on File Prior to Visit  Medication Sig Dispense Refill  . ACCU-CHEK AVIVA PLUS test strip TEST AS DIRECTED THREE TIMES DAILY 100 each 5  . amiodarone (PACERONE) 200 MG tablet Take 200 mg by mouth 2 (two) times daily.    Marland Kitchen apixaban (ELIQUIS) 5 MG TABS tablet Take 5 mg by mouth 2 (two) times daily.    Marland Kitchen atorvastatin (LIPITOR) 80 MG tablet Take 1 tablet (80 mg total) by mouth daily. 30 tablet 3  . buPROPion (WELLBUTRIN XL) 300 MG 24 hr tablet Take 1 tablet (300 mg total) by mouth daily. 30 tablet 2  . diltiazem (CARDIZEM CD) 120 MG 24 hr capsule Take 1 capsule (120 mg total) by mouth daily. 90 capsule 3  . doxercalciferol (HECTOROL) 2.5 MCG capsule TABLET 1 CAPSULE BY MOUTH DAILY 90 capsule 0  . DULoxetine (CYMBALTA) 60 MG capsule Take 1 capsule (60 mg total) by mouth daily. 90 capsule 1  . Ergocalciferol (VITAMIN D2 PO) Take 50,000 Units by mouth every 30 (thirty) days.    . Fluticasone Furoate-Vilanterol (BREO ELLIPTA IN) Inhale 1 puff into the lungs.    . furosemide (LASIX) 40 MG tablet Take 40-80 mg by mouth daily.    Marland Kitchen gabapentin (NEURONTIN) 300 MG capsule TAKE 2 CAPSULES BY MOUTH THREE TIMES DAILY 540 capsule 0  . Insulin Glargine (TOUJEO SOLOSTAR) 300 UNIT/ML SOPN Inject 60 Units into the skin every morning. 4.5 mL 3  . insulin lispro (HUMALOG KWIKPEN) 100 UNIT/ML KiwkPen Inject 20 units three times a day. 15 mL 3  . Insulin Syringe-Needle U-100 (LITETOUCH INSULIN SYRINGE) 31G X 5/16" 0.3 ML MISC Use as directed 90 each 1  . ipratropium-albuterol (DUONEB) 0.5-2.5 (3) MG/3ML SOLN Take 3 mLs by nebulization every 6 (six) hours as needed. 360 mL 1  . ketoconazole (NIZORAL) 2 % shampoo Apply 1 application topically 2 (two) times a week. 120 mL 0  . levothyroxine (SYNTHROID, LEVOTHROID) 112 MCG  tablet Take 112 mcg by mouth daily before breakfast.    . metoCLOPramide (REGLAN) 5 MG tablet Take 1 tablet (5 mg total) by mouth 2 (two) times daily before lunch and supper. 60 tablet 3  . montelukast (SINGULAIR) 10 MG tablet Take 10 mg by mouth at bedtime.    . nebivolol (BYSTOLIC) 10 MG tablet Take 10 mg by mouth  daily.    Marland Kitchen NOVOLOG FLEXPEN 100 UNIT/ML FlexPen INJECT 20 UNITS THREE TIMES DAILY AS DIRECTED (Patient taking differently: Take as directed. sliding scale) 15 mL 3  . nystatin (MYCOSTATIN) 100000 UNIT/ML suspension Take 5 mLs (500,000 Units total) by mouth 4 (four) times daily. 240 mL 0  . nystatin ointment (MYCOSTATIN) APPLY EXTERNALLY TO THE AFFECTED AREA TWICE DAILY 30 g 0  . olopatadine (PATANOL) 0.1 % ophthalmic solution Place 1 drop into both eyes 2 (two) times daily as needed. 5 mL 2  . omeprazole (PRILOSEC) 20 MG capsule Take 1 capsule (20 mg total) by mouth daily. 90 capsule 3  . ondansetron (ZOFRAN) 4 MG tablet Take 1 tablet (4 mg total) by mouth every 8 (eight) hours as needed for nausea or vomiting. 30 tablet 1  . OXYGEN Inhale into the lungs. 2L all day and night    . ranolazine (RANEXA) 500 MG 12 hr tablet Take 500 mg by mouth 2 (two) times daily.    . roflumilast (DALIRESP) 500 MCG TABS tablet Take 500 mcg by mouth daily.    Marland Kitchen spironolactone (ALDACTONE) 25 MG tablet Take 25 mg by mouth daily.    . Testosterone (ANDROGEL) 20.25 MG/1.25GM (1.62%) GEL Apply 3 pumps on each arm daily (6) pumps total 2.5 g 3  . tiotropium (SPIRIVA) 18 MCG inhalation capsule Place 18 mcg into inhaler and inhale daily.     No current facility-administered medications on file prior to visit.    Review of Systems:  As per HPI- otherwise negative.   Physical Examination: Filed Vitals:   06/20/15 1158  BP: 142/70  Pulse: 80   Filed Vitals:   06/20/15 1158  Weight: 224 lb 6.4 oz (101.787 kg)   Body mass index is 31.31 kg/(m^2). Ideal Body Weight:    GEN: WDWN, NAD, Non-toxic, A &  O x 3, obese, looks well HEENT: Atraumatic, Normocephalic. Neck supple. No masses, No LAD. Ears and Nose: No external deformity. CV: RRR, No M/G/R. No JVD. No thrill. No extra heart sounds. PULM: CTA B, no wheezes, crackles, rhonchi. No retractions. No resp. distress. No accessory muscle use. ABD: S, NT, ND EXTR: No c/c/e NEURO Normal gait for pt, uses a walker PSYCH: Normally interactive. Conversant. Not depressed or anxious appearing.  Calm demeanor.    Assessment and Plan: Hospital discharge follow-up - Plan: CBC, Comprehensive metabolic panel  CAP (community acquired pneumonia)  Pyelonephritis - Plan: Urine culture   Here today to follow-up from a recent 3 day admission in [place] for sepsis due to ?urosepsis and CAP.  Faxed records release today so that I can get further details Encouraged him to fill and start the oral abx right away He will bring in a urine sample when he can for repeat culture- does not feel he can do today Check basic labs He has a follow-up with his PCP, Elyn Aquas PA-C in about 10 days and will recheck then   Signed Lamar Blinks, MD

## 2015-06-20 NOTE — Patient Instructions (Addendum)
Bring back a urine for culture when you are able Please fill and start the augmentin rx today!   I will be in touch with the rest of your labs asap  Let me know if you do not continue to feel better  Let's plan to see you for a recheck in 2 months assuming you continue to do well

## 2015-06-21 NOTE — Addendum Note (Signed)
Addended by: Caffie Pinto on: 06/21/2015 02:20 PM   Modules accepted: Orders

## 2015-06-22 LAB — URINE CULTURE
COLONY COUNT: NO GROWTH
Organism ID, Bacteria: NO GROWTH

## 2015-07-02 ENCOUNTER — Encounter: Payer: Self-pay | Admitting: Physician Assistant

## 2015-07-02 ENCOUNTER — Ambulatory Visit (INDEPENDENT_AMBULATORY_CARE_PROVIDER_SITE_OTHER): Payer: Medicare Other | Admitting: Physician Assistant

## 2015-07-02 VITALS — BP 140/70 | HR 76 | Temp 98.1°F | Ht 71.0 in | Wt 214.2 lb

## 2015-07-02 DIAGNOSIS — N289 Disorder of kidney and ureter, unspecified: Secondary | ICD-10-CM | POA: Diagnosis not present

## 2015-07-02 DIAGNOSIS — D72829 Elevated white blood cell count, unspecified: Secondary | ICD-10-CM | POA: Insufficient documentation

## 2015-07-02 DIAGNOSIS — F331 Major depressive disorder, recurrent, moderate: Secondary | ICD-10-CM

## 2015-07-02 LAB — CBC WITH DIFFERENTIAL/PLATELET
BASOS ABS: 0 10*3/uL (ref 0.0–0.1)
Basophils Relative: 0.4 % (ref 0.0–3.0)
Eosinophils Absolute: 0.2 10*3/uL (ref 0.0–0.7)
Eosinophils Relative: 1.9 % (ref 0.0–5.0)
HCT: 34.8 % — ABNORMAL LOW (ref 39.0–52.0)
Hemoglobin: 11.5 g/dL — ABNORMAL LOW (ref 13.0–17.0)
LYMPHS ABS: 1.5 10*3/uL (ref 0.7–4.0)
Lymphocytes Relative: 14.7 % (ref 12.0–46.0)
MCHC: 33 g/dL (ref 30.0–36.0)
MCV: 90.2 fl (ref 78.0–100.0)
MONO ABS: 0.6 10*3/uL (ref 0.1–1.0)
MONOS PCT: 6.4 % (ref 3.0–12.0)
NEUTROS PCT: 76.6 % (ref 43.0–77.0)
Neutro Abs: 7.8 10*3/uL — ABNORMAL HIGH (ref 1.4–7.7)
Platelets: 333 10*3/uL (ref 150.0–400.0)
RBC: 3.86 Mil/uL — AB (ref 4.22–5.81)
RDW: 13.5 % (ref 11.5–15.5)
WBC: 10.1 10*3/uL (ref 4.0–10.5)

## 2015-07-02 LAB — BASIC METABOLIC PANEL
BUN: 41 mg/dL — ABNORMAL HIGH (ref 6–23)
CHLORIDE: 107 meq/L (ref 96–112)
CO2: 21 meq/L (ref 19–32)
Calcium: 8.9 mg/dL (ref 8.4–10.5)
Creatinine, Ser: 2.81 mg/dL — ABNORMAL HIGH (ref 0.40–1.50)
GFR: 23.35 mL/min — ABNORMAL LOW (ref >60.00)
Glucose, Bld: 332 mg/dL — ABNORMAL HIGH (ref 70–99)
Potassium: 5 mEq/L (ref 3.5–5.1)
SODIUM: 139 meq/L (ref 135–145)

## 2015-07-02 MED ORDER — ERGOCALCIFEROL 1.25 MG (50000 UT) PO CAPS: 50000.0000 [IU] | ORAL_CAPSULE | ORAL | Status: DC

## 2015-07-02 NOTE — Patient Instructions (Signed)
Please go to the lab for blood work. I will call with your results. Please continue medications as directed. As long as repeat labs look good we will follow-up at your earliest convenience for a physical exam.

## 2015-07-02 NOTE — Progress Notes (Signed)
Pre visit review using our clinic review tool, if applicable. No additional management support is needed unless otherwise documented below in the visit note. 

## 2015-07-02 NOTE — Progress Notes (Signed)
Patient presents to clinic today for 6 months follow-up of depression and anxiety. Patient currently on a regimen of Wellbutrin XL 300 mg daily, Duloxetine 60 mg daily and Gabapentin 300 mg. The patient takes the latter two medications for neuropathy as well. Endorses taking medications as directed. Endorses good mood overall. Denies side effects of medications. Is sleeping well.   Patient also seen on 06/20/15 by Dr. Lorelei Pont for hospital follow-up in my absence. Has finished oral antibiotics. Denies residual fever, chills, chest congestion or cough. Labs obtained at visit revealed elevated WBC count and decline from baseline creatinine. Needs repeat assessment today.  Past Medical History  Diagnosis Date  . COPD (chronic obstructive pulmonary disease) ([place])   . CHF (congestive heart failure) ([place])   . Thyroid disease   . Chronic bronchitis ([place])   . Atrial fibrillation ([place])   . CKD (chronic kidney disease)   . Diabetes mellitus without complication ([place])   . Allergy   . Esophageal cancer (HCC)     esophogeal   . CKD (chronic kidney disease)   . Depression   . Hypertension   . Colon polyps   . CAD (coronary artery disease)   . Hyperlipidemia   . GERD (gastroesophageal reflux disease)   . History of renal stone   . Stroke [facility])     ? TIA per pt  . Shortness of breath dyspnea     with exertion  . Pneumonia   . Dyslexia   . Enlarged prostate   . Headache     hx of - none in years  . Anemia   . Loose stools   . Flatulence/gas pain/belching     Current Outpatient Prescriptions on File Prior to Visit  Medication Sig Dispense Refill  . ACCU-CHEK AVIVA PLUS test strip TEST AS DIRECTED THREE TIMES DAILY 100 each 5  . amiodarone (PACERONE) 200 MG tablet Take 200 mg by mouth 2 (two) times daily.    Marland Kitchen apixaban (ELIQUIS) 5 MG TABS tablet Take 5 mg by mouth 2 (two) times daily.    Marland Kitchen atorvastatin (LIPITOR) 80 MG tablet Take 1 tablet (80 mg total) by mouth daily. 30 tablet 3  .  buPROPion (WELLBUTRIN XL) 300 MG 24 hr tablet Take 1 tablet (300 mg total) by mouth daily. 30 tablet 2  . diltiazem (CARDIZEM CD) 120 MG 24 hr capsule Take 1 capsule (120 mg total) by mouth daily. 90 capsule 3  . doxercalciferol (HECTOROL) 2.5 MCG capsule TABLET 1 CAPSULE BY MOUTH DAILY 90 capsule 0  . DULoxetine (CYMBALTA) 60 MG capsule Take 1 capsule (60 mg total) by mouth daily. 90 capsule 1  . Ergocalciferol (VITAMIN D2 PO) Take 50,000 Units by mouth every 30 (thirty) days.    . Fluticasone Furoate-Vilanterol (BREO ELLIPTA IN) Inhale 1 puff into the lungs.    . furosemide (LASIX) 40 MG tablet Take 40-80 mg by mouth daily.    Marland Kitchen gabapentin (NEURONTIN) 300 MG capsule TAKE 2 CAPSULES BY MOUTH THREE TIMES DAILY 540 capsule 0  . Insulin Glargine (TOUJEO SOLOSTAR) 300 UNIT/ML SOPN Inject 60 Units into the skin every morning. 4.5 mL 3  . insulin lispro (HUMALOG KWIKPEN) 100 UNIT/ML KiwkPen Inject 20 units three times a day. 15 mL 3  . Insulin Syringe-Needle U-100 (LITETOUCH INSULIN SYRINGE) 31G X 5/16" 0.3 ML MISC Use as directed 90 each 1  . ipratropium-albuterol (DUONEB) 0.5-2.5 (3) MG/3ML SOLN Take 3 mLs by nebulization every 6 (six) hours as needed. 360 mL  1  . ketoconazole (NIZORAL) 2 % shampoo Apply 1 application topically 2 (two) times a week. 120 mL 0  . levothyroxine (SYNTHROID, LEVOTHROID) 112 MCG tablet Take 112 mcg by mouth daily before breakfast.    . metoCLOPramide (REGLAN) 5 MG tablet Take 1 tablet (5 mg total) by mouth 2 (two) times daily before lunch and supper. 60 tablet 3  . montelukast (SINGULAIR) 10 MG tablet Take 10 mg by mouth at bedtime.    . nebivolol (BYSTOLIC) 10 MG tablet Take 10 mg by mouth daily.    Marland Kitchen nystatin (MYCOSTATIN) 100000 UNIT/ML suspension Take 5 mLs (500,000 Units total) by mouth 4 (four) times daily. 240 mL 0  . nystatin ointment (MYCOSTATIN) APPLY EXTERNALLY TO THE AFFECTED AREA TWICE DAILY 30 g 0  . olopatadine (PATANOL) 0.1 % ophthalmic solution Place 1 drop  into both eyes 2 (two) times daily as needed. 5 mL 2  . omeprazole (PRILOSEC) 20 MG capsule Take 1 capsule (20 mg total) by mouth daily. 90 capsule 3  . ondansetron (ZOFRAN) 4 MG tablet Take 1 tablet (4 mg total) by mouth every 8 (eight) hours as needed for nausea or vomiting. 30 tablet 1  . OXYGEN Inhale into the lungs. 2L all day and night    . ranolazine (RANEXA) 500 MG 12 hr tablet Take 500 mg by mouth 2 (two) times daily.    . roflumilast (DALIRESP) 500 MCG TABS tablet Take 500 mcg by mouth daily.    Marland Kitchen spironolactone (ALDACTONE) 25 MG tablet Take 25 mg by mouth daily.    . Testosterone (ANDROGEL) 20.25 MG/1.25GM (1.62%) GEL Apply 3 pumps on each arm daily (6) pumps total 2.5 g 3  . tiotropium (SPIRIVA) 18 MCG inhalation capsule Place 18 mcg into inhaler and inhale daily.     No current facility-administered medications on file prior to visit.    Allergies  Allergen Reactions  . Dust Mite Extract Other (See Comments)    Runny nose   . Pollen Extract Other (See Comments)    Runny nose    Family History  Problem Relation Age of Onset  . COPD Father   . Diabetes Neg Hx     Social History   Social History  . Marital Status: Single    Spouse Name: N/A  . Number of Children: N/A  . Years of Education: N/A   Social History Main Topics  . Smoking status: Former Smoker -- 1.00 packs/day for 42 years    Types: Cigarettes    Quit date: 04/07/2001  . Smokeless tobacco: Never Used  . Alcohol Use: No  . Drug Use: No  . Sexual Activity: Not Asked   Other Topics Concern  . None   Social History Narrative   Review of Systems - See HPI.  All other ROS are negative.  BP 140/70 mmHg  Pulse 76  Temp(Src) 98.1 F (36.7 C) (Oral)  Ht 5\' 11"  (1.803 m)  Wt 214 lb 3.2 oz (97.16 kg)  BMI 29.89 kg/m2  SpO2 97%  Physical Exam  Constitutional: He is oriented to person, place, and time and well-developed, well-nourished, and in no distress.  HENT:  Head: Normocephalic and  atraumatic.  Right Ear: External ear normal.  Left Ear: External ear normal.  Nose: Nose normal.  Mouth/Throat: Oropharynx is clear and moist. No oropharyngeal exudate.  TM within normal limits.   Eyes: Conjunctivae are normal.  Neck: Neck supple.  Cardiovascular: Normal rate, regular rhythm, normal heart sounds and intact distal  pulses.   Pulmonary/Chest: Effort normal and breath sounds normal. No respiratory distress. He has no wheezes. He has no rales. He exhibits no tenderness.  Neurological: He is alert and oriented to person, place, and time.  Skin: Skin is warm and dry. No rash noted.  Psychiatric: Affect normal.  Vitals reviewed.   Recent Results (from the past 2160 hour(s))  Glucose, capillary     Status: Abnormal   Collection Time: 05/16/15 10:53 AM  Result Value Ref Range   Glucose-Capillary 123 (H) 65 - 99 mg/dL  POCT HgB A1C     Status: Abnormal   Collection Time: 06/04/15  1:11 PM  Result Value Ref Range   Hemoglobin A1C 7.0   Basic metabolic panel     Status: Abnormal   Collection Time: 06/04/15  1:26 PM  Result Value Ref Range   Sodium 140 135 - 145 mEq/L   Potassium 4.7 3.5 - 5.1 mEq/L   Chloride 108 96 - 112 mEq/L   CO2 23 19 - 32 mEq/L   Glucose, Bld 168 (H) 70 - 99 mg/dL   BUN 38 (H) 6 - 23 mg/dL   Creatinine, Ser 2.85 (H) 0.40 - 1.50 mg/dL   Calcium 9.3 8.4 - 10.5 mg/dL   GFR 22.98 (L) >60.00 mL/min  Lipid panel     Status: Abnormal   Collection Time: 06/04/15  1:26 PM  Result Value Ref Range   Cholesterol 165 0 - 200 mg/dL    Comment: ATP III Classification       Desirable:  < 200 mg/dL               Borderline High:  200 - 239 mg/dL          High:  > = 240 mg/dL   Triglycerides 266.0 (H) 0.0 - 149.0 mg/dL    Comment: Normal:  <150 mg/dLBorderline High:  150 - 199 mg/dL   HDL 34.10 (L) >39.00 mg/dL   VLDL 53.2 (H) 0.0 - 40.0 mg/dL   Total CHOL/HDL Ratio 5     Comment:                Men          Women1/2 Average Risk     3.4          3.3Average  Risk          5.0          4.42X Average Risk          9.6          7.13X Average Risk          15.0          11.0                       NonHDL 131.29     Comment: NOTE:  Non-HDL goal should be 30 mg/dL higher than patient's LDL goal (i.e. LDL goal of < 70 mg/dL, would have non-HDL goal of < 100 mg/dL)  Testosterone     Status: Abnormal   Collection Time: 06/04/15  1:26 PM  Result Value Ref Range   Testosterone 156.31 (L) 300.00 - 890.00 ng/dL  TSH     Status: None   Collection Time: 06/04/15  1:26 PM  Result Value Ref Range   TSH 1.24 0.35 - 4.50 uIU/mL  LDL cholesterol, direct     Status: None   Collection Time: 06/04/15  1:26 PM  Result  Value Ref Range   Direct LDL 97.0 mg/dL    Comment: Optimal:  <100 mg/dLNear or Above Optimal:  100-129 mg/dLBorderline High:  130-159 mg/dLHigh:  160-189 mg/dLVery High:  >190 mg/dL  CBC     Status: Abnormal   Collection Time: 06/20/15 12:33 PM  Result Value Ref Range   WBC 11.2 (H) 4.0 - 10.5 K/uL   RBC 3.63 (L) 4.22 - 5.81 Mil/uL   Platelets 466.0 (H) 150.0 - 400.0 K/uL   Hemoglobin 11.0 (L) 13.0 - 17.0 g/dL   HCT 33.1 (L) 39.0 - 52.0 %   MCV 91.3 78.0 - 100.0 fl   MCHC 33.2 30.0 - 36.0 g/dL   RDW 14.4 11.5 - 15.5 %  Comprehensive metabolic panel     Status: Abnormal   Collection Time: 06/20/15 12:33 PM  Result Value Ref Range   Sodium 139 135 - 145 mEq/L   Potassium 4.9 3.5 - 5.1 mEq/L   Chloride 111 96 - 112 mEq/L   CO2 20 19 - 32 mEq/L   Glucose, Bld 175 (H) 70 - 99 mg/dL   BUN 41 (H) 6 - 23 mg/dL   Creatinine, Ser 2.97 (H) 0.40 - 1.50 mg/dL   Total Bilirubin 0.5 0.2 - 1.2 mg/dL   Alkaline Phosphatase 105 39 - 117 U/L   AST 15 0 - 37 U/L   ALT 14 0 - 53 U/L   Total Protein 6.3 6.0 - 8.3 g/dL   Albumin 3.3 (L) 3.5 - 5.2 g/dL   Calcium 8.8 8.4 - 10.5 mg/dL   GFR 21.91 (L) >60.00 mL/min  Urine culture     Status: None   Collection Time: 06/21/15  2:20 PM  Result Value Ref Range   Colony Count NO GROWTH    Organism ID, Bacteria NO  GROWTH     Assessment/Plan: Leukocytosis From sepsis and CAP. Clinically resolved. Will repeat labs today.  Major depressive disorder, recurrent episode, moderate Doing very well on current regimen. Will continue current medications as directed. Follow-up every 6 months.  Renal insufficiency Acute on chronic noted decline from baseline on last  Labs. Will reassess today. Patient is followed by Nep[facility]ology with appointment this week.

## 2015-07-02 NOTE — Assessment & Plan Note (Signed)
From sepsis and CAP. Clinically resolved. Will repeat labs today.

## 2015-07-02 NOTE — Assessment & Plan Note (Signed)
Doing very well on current regimen. Will continue current medications as directed. Follow-up every 6 months.

## 2015-07-02 NOTE — Assessment & Plan Note (Signed)
Acute on chronic noted decline from baseline on last  Labs. Will reassess today. Patient is followed by Neprhology with appointment this week.

## 2015-07-06 ENCOUNTER — Telehealth: Payer: Self-pay | Admitting: Endocrinology

## 2015-07-06 NOTE — Telephone Encounter (Signed)
[employer] neesd Auth for Androgel Pump CB# [phone] option 1

## 2015-07-06 NOTE — Telephone Encounter (Signed)
It's on Dr. Jodelle Green desk for signature.

## 2015-07-09 ENCOUNTER — Ambulatory Visit: Payer: 59 | Admitting: Psychology

## 2015-07-10 LAB — HM DIABETES EYE EXAM

## 2015-07-12 ENCOUNTER — Encounter: Payer: Self-pay | Admitting: Medical

## 2015-07-12 ENCOUNTER — Ambulatory Visit (INDEPENDENT_AMBULATORY_CARE_PROVIDER_SITE_OTHER): Payer: Medicare Other | Admitting: Medical

## 2015-07-12 VITALS — BP 128/68 | HR 67 | Temp 97.4°F | Ht 71.0 in | Wt 214.6 lb

## 2015-07-12 DIAGNOSIS — J209 Acute bronchitis, unspecified: Secondary | ICD-10-CM | POA: Diagnosis not present

## 2015-07-12 DIAGNOSIS — J0100 Acute maxillary sinusitis, unspecified: Secondary | ICD-10-CM

## 2015-07-12 DIAGNOSIS — R05 Cough: Secondary | ICD-10-CM

## 2015-07-12 DIAGNOSIS — J111 Influenza due to unidentified influenza virus with other respiratory manifestations: Secondary | ICD-10-CM

## 2015-07-12 DIAGNOSIS — R059 Cough, unspecified: Secondary | ICD-10-CM

## 2015-07-12 LAB — POC INFLUENZA A&B (BINAX/QUICKVUE)
Influenza A, POC: NEGATIVE
Influenza B, POC: NEGATIVE

## 2015-07-12 MED ORDER — FLUTICASONE PROPIONATE 50 MCG/ACT NA SUSP: 2.0000 | Freq: Every day | NASAL | Status: AC

## 2015-07-12 MED ORDER — OSELTAMIVIR PHOSPHATE 75 MG PO CAPS: 75.0000 mg | ORAL_CAPSULE | Freq: Two times a day (BID) | ORAL | Status: DC

## 2015-07-12 MED ORDER — BENZONATATE 100 MG PO CAPS: 100.0000 mg | ORAL_CAPSULE | Freq: Three times a day (TID) | ORAL | Status: DC | PRN

## 2015-07-12 MED ORDER — DOXYCYCLINE HYCLATE 100 MG PO TABS: 100.0000 mg | ORAL_TABLET | Freq: Two times a day (BID) | ORAL | Status: DC

## 2015-07-12 NOTE — Progress Notes (Signed)
Pre visit review using our clinic review tool, if applicable. No additional management support is needed unless otherwise documented below in the visit note. 

## 2015-07-12 NOTE — Addendum Note (Signed)
Addended by: Tasia Catchings on: 07/12/2015 05:38 PM   Modules accepted: Orders

## 2015-07-12 NOTE — Patient Instructions (Addendum)
You have flu like syndrome and I am seeing you early enough that will treat with tamiflu.  You appear to have secondary sinusitis and bronchitis so will rx doxycycline  For cough will rx benzonatate.  For nasal congestion rx flonase.  Continue your inhalers and use neb treatment as well.  Follow up 7-10 days or as needed

## 2015-07-12 NOTE — Progress Notes (Signed)
Subjective:    Patient ID: Ricky Baker, male    DOB: 07-Oct-1935, 80 y.o.   MRN: [id number]  HPI   Pt has been feeling sick since Tuesday. He slept a lot on Tuesday. Pt has felt achy last 2 days. Pt staes occasional cough with his copd and chest congestion. He is concerned for getting worse. He is going to [facility]. Pt about one month or so he had pneumonia. He states hospitalized in [place].  Pt has felt chills and sweating on Tuesday. Some night sweats.   Review of Systems  Constitutional: Positive for fever, chills and diaphoresis.  HENT: Positive for congestion. Negative for ear pain, postnasal drip, sinus pressure and sneezing.   Respiratory: Positive for cough. Negative for chest tightness, shortness of breath and wheezing.        Rare cough  Cardiovascular: Negative for chest pain and palpitations.  Gastrointestinal: Negative for abdominal pain.  Neurological: Negative for dizziness and headaches.  Hematological: Negative for adenopathy. Does not bruise/bleed easily.  Psychiatric/Behavioral: Negative for behavioral problems and confusion.    Past Medical History  Diagnosis Date  . COPD (chronic obstructive pulmonary disease) ([place])   . CHF (congestive heart failure) ([place])   . Thyroid disease   . Chronic bronchitis ([place])   . Atrial fibrillation ([place])   . CKD (chronic kidney disease)   . Diabetes mellitus without complication ([place])   . Allergy   . Esophageal cancer (HCC)     esophogeal   . CKD (chronic kidney disease)   . Depression   . Hypertension   . Colon polyps   . CAD (coronary artery disease)   . Hyperlipidemia   . GERD (gastroesophageal reflux disease)   . History of renal stone   . Stroke [facility])     ? TIA per pt  . Shortness of breath dyspnea     with exertion  . Pneumonia   . Dyslexia   . Enlarged prostate   . Headache     hx of - none in years  . Anemia   . Loose stools   . Flatulence/gas pain/belching     Social History   Social  History  . Marital Status: Single    Spouse Name: N/A  . Number of Children: N/A  . Years of Education: N/A   Occupational History  . Not on file.   Social History Main Topics  . Smoking status: Former Smoker -- 1.00 packs/day for 42 years    Types: Cigarettes    Quit date: 04/07/2001  . Smokeless tobacco: Never Used  . Alcohol Use: No  . Drug Use: No  . Sexual Activity: Not on file   Other Topics Concern  . Not on file   Social History Narrative    Past Surgical History  Procedure Laterality Date  . Esophagus surgery  2006  . Spine surgery      bone spurs  . Cardiac catheterization  2009    two stents placed  . Tonsillectomy    . Eye surgery Bilateral     cataract surgery with lens implants  . Colonoscopy    . Esophagogastroduodenoscopy (egd) with propofol N/A 05/16/2015    Procedure: ESOPHAGOGASTRODUODENOSCOPY (EGD) WITH PROPOFOL;  Surgeon: Mauri Pole, MD;  Location: [facility];  Service: Endoscopy;  Laterality: N/A;    Family History  Problem Relation Age of Onset  . COPD Father   . Diabetes Neg Hx     Allergies  Allergen Reactions  .  Dust Mite Extract Other (See Comments)    Runny nose   . Pollen Extract Other (See Comments)    Runny nose    Current Outpatient Prescriptions on File Prior to Visit  Medication Sig Dispense Refill  . ACCU-CHEK AVIVA PLUS test strip TEST AS DIRECTED THREE TIMES DAILY 100 each 5  . amiodarone (PACERONE) 200 MG tablet Take 200 mg by mouth 2 (two) times daily.    Marland Kitchen apixaban (ELIQUIS) 5 MG TABS tablet Take 5 mg by mouth 2 (two) times daily.    Marland Kitchen atorvastatin (LIPITOR) 80 MG tablet Take 1 tablet (80 mg total) by mouth daily. 30 tablet 3  . buPROPion (WELLBUTRIN XL) 300 MG 24 hr tablet Take 1 tablet (300 mg total) by mouth daily. 30 tablet 2  . diltiazem (CARDIZEM CD) 120 MG 24 hr capsule Take 1 capsule (120 mg total) by mouth daily. 90 capsule 3  . doxercalciferol (HECTOROL) 2.5 MCG capsule TABLET 1 CAPSULE BY MOUTH  DAILY 90 capsule 0  . DULoxetine (CYMBALTA) 60 MG capsule Take 1 capsule (60 mg total) by mouth daily. 90 capsule 1  . ergocalciferol (VITAMIN D2) 50000 units capsule Take 1 capsule (50,000 Units total) by mouth every 30 (thirty) days. 3 capsule 4  . Fluticasone Furoate-Vilanterol (BREO ELLIPTA IN) Inhale 1 puff into the lungs.    . furosemide (LASIX) 40 MG tablet Take 40-80 mg by mouth daily.    Marland Kitchen gabapentin (NEURONTIN) 300 MG capsule TAKE 2 CAPSULES BY MOUTH THREE TIMES DAILY 540 capsule 0  . Insulin Glargine (TOUJEO SOLOSTAR) 300 UNIT/ML SOPN Inject 60 Units into the skin every morning. 4.5 mL 3  . insulin lispro (HUMALOG KWIKPEN) 100 UNIT/ML KiwkPen Inject 20 units three times a day. 15 mL 3  . Insulin Syringe-Needle U-100 (LITETOUCH INSULIN SYRINGE) 31G X 5/16" 0.3 ML MISC Use as directed 90 each 1  . ipratropium-albuterol (DUONEB) 0.5-2.5 (3) MG/3ML SOLN Take 3 mLs by nebulization every 6 (six) hours as needed. 360 mL 1  . ketoconazole (NIZORAL) 2 % shampoo Apply 1 application topically 2 (two) times a week. 120 mL 0  . levothyroxine (SYNTHROID, LEVOTHROID) 112 MCG tablet Take 112 mcg by mouth daily before breakfast.    . metoCLOPramide (REGLAN) 5 MG tablet Take 1 tablet (5 mg total) by mouth 2 (two) times daily before lunch and supper. 60 tablet 3  . montelukast (SINGULAIR) 10 MG tablet Take 10 mg by mouth at bedtime.    . nebivolol (BYSTOLIC) 10 MG tablet Take 10 mg by mouth daily.    Marland Kitchen nystatin (MYCOSTATIN) 100000 UNIT/ML suspension Take 5 mLs (500,000 Units total) by mouth 4 (four) times daily. 240 mL 0  . nystatin ointment (MYCOSTATIN) APPLY EXTERNALLY TO THE AFFECTED AREA TWICE DAILY 30 g 0  . olopatadine (PATANOL) 0.1 % ophthalmic solution Place 1 drop into both eyes 2 (two) times daily as needed. 5 mL 2  . omeprazole (PRILOSEC) 20 MG capsule Take 1 capsule (20 mg total) by mouth daily. 90 capsule 3  . ondansetron (ZOFRAN) 4 MG tablet Take 1 tablet (4 mg total) by mouth every 8  (eight) hours as needed for nausea or vomiting. 30 tablet 1  . OXYGEN Inhale into the lungs. 2L all day and night    . ranolazine (RANEXA) 500 MG 12 hr tablet Take 500 mg by mouth 2 (two) times daily.    . roflumilast (DALIRESP) 500 MCG TABS tablet Take 500 mcg by mouth daily.    Marland Kitchen spironolactone (  ALDACTONE) 25 MG tablet Take 25 mg by mouth daily.    . Testosterone (ANDROGEL) 20.25 MG/1.25GM (1.62%) GEL Apply 3 pumps on each arm daily (6) pumps total 2.5 g 3  . tiotropium (SPIRIVA) 18 MCG inhalation capsule Place 18 mcg into inhaler and inhale daily.     No current facility-administered medications on file prior to visit.    BP 128/68 mmHg  Pulse 67  Temp(Src) 97.4 F (36.3 C) (Oral)  Ht 5\' 11"  (1.803 m)  Wt 214 lb 9.6 oz (97.342 kg)  BMI 29.94 kg/m2  SpO2 98%       Objective:   Physical Exam  General  Mental Status - Alert. General Appearance - Well groomed. Not in acute distress.  Skin Rashes- No Rashes.  HEENT Head- Normal. Ear Auditory Canal - Left- Normal. Right - Normal.Tympanic Membrane- Left- Normal. Right- Normal. Eye Sclera/Conjunctiva- Left- Normal. Right- Normal. Nose & Sinuses Nasal Mucosa- Left-  Boggy and Congested. Right-  Boggy and  Congested.Bilateral maxillary pressure and no  frontal sinus pressure. Mouth & Throat Lips: Upper Lip- Normal: no dryness, cracking, pallor, cyanosis, or vesicular eruption. Lower Lip-Normal: no dryness, cracking, pallor, cyanosis or vesicular eruption. Buccal Mucosa- Bilateral- No Aphthous ulcers. Oropharynx- No Discharge or Erythema. Tonsils: Characteristics- Bilateral- No Erythema or Congestion. Size/Enlargement- Bilateral- No enlargement. Discharge- bilateral-None.  Neck Neck- Supple. No Masses.   Chest and Lung Exam Auscultation: Breath Sounds:-Clear even and unlabored.  Cardiovascular Auscultation:Rythm- Regular, rate and rhythm. Murmurs & Other Heart Sounds:Ausculatation of the heart reveal- No  Murmurs.  Lymphatic Head & Neck General Head & Neck Lymphatics: Bilateral: Description- No Localized lymphadenopathy.       Assessment & Plan:  You have flu like syndrome and I am seeing you early enough that will treat with tamiflu. Flu test negtive treating clinically.  You appear to have secondary sinusitis and bronchitis so will rx doxycycline.  For cough will rx benzonatate.  For nasal congestion rx flonase.  Continue your inhalers and use neb treatment as well.  Follow up 7-10 days or as needed

## 2015-07-17 ENCOUNTER — Other Ambulatory Visit: Payer: Self-pay | Admitting: *Deleted

## 2015-07-17 MED ORDER — RANOLAZINE ER 500 MG PO TB12: 500.0000 mg | ORAL_TABLET | Freq: Two times a day (BID) | ORAL | Status: AC

## 2015-07-17 MED ORDER — AMIODARONE HCL 200 MG PO TABS: 200.0000 mg | ORAL_TABLET | Freq: Two times a day (BID) | ORAL | Status: DC

## 2015-07-17 MED ORDER — APIXABAN 5 MG PO TABS: 5.0000 mg | ORAL_TABLET | Freq: Two times a day (BID) | ORAL | Status: DC

## 2015-07-23 ENCOUNTER — Other Ambulatory Visit: Payer: Self-pay | Admitting: Physician Assistant

## 2015-07-23 NOTE — Telephone Encounter (Signed)
Medication filled to pharmacy as requested.   

## 2015-07-25 ENCOUNTER — Ambulatory Visit (INDEPENDENT_AMBULATORY_CARE_PROVIDER_SITE_OTHER): Payer: 59 | Admitting: Psychology

## 2015-07-25 DIAGNOSIS — F331 Major depressive disorder, recurrent, moderate: Secondary | ICD-10-CM

## 2015-07-26 ENCOUNTER — Ambulatory Visit (HOSPITAL_BASED_OUTPATIENT_CLINIC_OR_DEPARTMENT_OTHER)
Admission: RE | Admit: 2015-07-26 | Discharge: 2015-07-26 | Disposition: A | Discharge status: Home / Self Care | Payer: Medicare Other | Source: Ambulatory Visit | Attending: Internal Medicine | Admitting: Internal Medicine

## 2015-07-26 ENCOUNTER — Ambulatory Visit (INDEPENDENT_AMBULATORY_CARE_PROVIDER_SITE_OTHER): Payer: Medicare Other | Admitting: Internal Medicine

## 2015-07-26 ENCOUNTER — Encounter: Payer: Self-pay | Admitting: Internal Medicine

## 2015-07-26 VITALS — BP 124/68 | HR 98 | Temp 97.8°F | Ht 71.0 in | Wt 214.2 lb

## 2015-07-26 DIAGNOSIS — J189 Pneumonia, unspecified organism: Secondary | ICD-10-CM | POA: Diagnosis not present

## 2015-07-26 DIAGNOSIS — J441 Chronic obstructive pulmonary disease with (acute) exacerbation: Secondary | ICD-10-CM | POA: Diagnosis not present

## 2015-07-26 DIAGNOSIS — R918 Other nonspecific abnormal finding of lung field: Secondary | ICD-10-CM | POA: Diagnosis not present

## 2015-07-26 LAB — CBC WITH DIFFERENTIAL/PLATELET
Basophils Absolute: 0 10*3/uL (ref 0.0–0.1)
Basophils Relative: 0.1 % (ref 0.0–3.0)
EOS ABS: 0.1 10*3/uL (ref 0.0–0.7)
EOS PCT: 0.4 % (ref 0.0–5.0)
HCT: 29.9 % — ABNORMAL LOW (ref 39.0–52.0)
Hemoglobin: 9.9 g/dL — ABNORMAL LOW (ref 13.0–17.0)
LYMPHS ABS: 0.9 10*3/uL (ref 0.7–4.0)
Lymphocytes Relative: 7.2 % — ABNORMAL LOW (ref 12.0–46.0)
MCHC: 33.2 g/dL (ref 30.0–36.0)
MCV: 89.7 fl (ref 78.0–100.0)
MONO ABS: 0.8 10*3/uL (ref 0.1–1.0)
Monocytes Relative: 6.3 % (ref 3.0–12.0)
NEUTROS PCT: 86 % — AB (ref 43.0–77.0)
Neutro Abs: 10.9 10*3/uL — ABNORMAL HIGH (ref 1.4–7.7)
Platelets: 300 10*3/uL (ref 150.0–400.0)
RBC: 3.33 Mil/uL — AB (ref 4.22–5.81)
RDW: 13.9 % (ref 11.5–15.5)
WBC: 12.7 10*3/uL — ABNORMAL HIGH (ref 4.0–10.5)

## 2015-07-26 MED ORDER — AZELASTINE HCL 0.1 % NA SOLN: 2.0000 | Freq: Every evening | NASAL | Status: DC | PRN

## 2015-07-26 MED ORDER — AMOXICILLIN 875 MG PO TABS: 875.0000 mg | ORAL_TABLET | Freq: Two times a day (BID) | ORAL | Status: DC

## 2015-07-26 MED ORDER — PREDNISONE 10 MG PO TABS: 10.0000 mg | ORAL_TABLET | Freq: Every day | ORAL | Status: DC

## 2015-07-26 MED ORDER — AMOXICILLIN-POT CLAVULANATE 875-125 MG PO TABS: 1.0000 | ORAL_TABLET | Freq: Two times a day (BID) | ORAL | Status: DC

## 2015-07-26 NOTE — Progress Notes (Signed)
Pre visit review using our clinic review tool, if applicable. No additional management support is needed unless otherwise documented below in the visit note. 

## 2015-07-26 NOTE — Patient Instructions (Addendum)
GO TO THE LAB :      Get the blood work     GO TO THE FRONT DESK Schedule your next appointment for a  checkup with your primary doctor in 10 days  STOP BY THE FIRST FLOOR:  get the XR     Will add prednisone for 5 days, if  blood sugars go over 180, stop prednisone  Continue with her inhalers on for the next few days use a nebulization at least twice a day  Continue Flonase 2 sprays on each side of the nose every morning  add Astelin 2 sprays on each side of the nose every night   Mucinex DM as needed for cough  If not gradually improving in the next 3 or 4 days, start amoxicillin.  ER if severe chest pain, high fever, increased difficulty breathing

## 2015-07-26 NOTE — Progress Notes (Signed)
Subjective:    Patient ID: Ricky Baker, male    DOB: 14-May-1935, 80 y.o.   MRN: [id number]  DOS:  07/26/2015 Type of visit - description : Acute visit Interval history: Was admitted to the hospital with pneumonia, pyelonephritis and sepsis. Subsequently was seen here, he was recovering, urine culture was negative. he felt better, almost back to normal.  Was seen again 07/12/2015 achy, sleepy, having cough. Was diagnosed with the flu, prescribed doxycycline and Tamiflu.  He is here today because has not improved, is doing about the same. Continue with some chest congestion, has developed more sinus symptoms, headache, sore throat, ear ache. Also having anterior chest pain/tightness sometimes some soreness but definitely worse when he takes a deep breath. Tessalon Perles not helping much, not taking Robitussin.   Review of Systems Had a temperature for 100.5 one time, few days ago. Now low-grade around 99. Mild edema of the lower extremities is slightly worse for the last couple of days, he decided not to take Lasix. + Nasal discharge and sputum, both clear No nausea or diarrhea.   Past Medical History  Diagnosis Date  . COPD (chronic obstructive pulmonary disease) ([place])   . CHF (congestive heart failure) ([place])   . Thyroid disease   . Chronic bronchitis ([place])   . Atrial fibrillation ([place])   . CKD (chronic kidney disease)   . Diabetes mellitus without complication ([place])   . Allergy   . Esophageal cancer (HCC)     esophogeal   . CKD (chronic kidney disease)   . Depression   . Hypertension   . Colon polyps   . CAD (coronary artery disease)   . Hyperlipidemia   . GERD (gastroesophageal reflux disease)   . History of renal stone   . Stroke [facility])     ? TIA per pt  . Shortness of breath dyspnea     with exertion  . Pneumonia   . Dyslexia   . Enlarged prostate   . Headache     hx of - none in years  . Anemia   . Loose stools   . Flatulence/gas pain/belching      Past Surgical History  Procedure Laterality Date  . Esophagus surgery  2006  . Spine surgery      bone spurs  . Cardiac catheterization  2009    two stents placed  . Tonsillectomy    . Eye surgery Bilateral     cataract surgery with lens implants  . Colonoscopy    . Esophagogastroduodenoscopy (egd) with propofol N/A 05/16/2015    Procedure: ESOPHAGOGASTRODUODENOSCOPY (EGD) WITH PROPOFOL;  Surgeon: Mauri Pole, MD;  Location: [facility];  Service: Endoscopy;  Laterality: N/A;    Social History   Social History  . Marital Status: Single    Spouse Name: N/A  . Number of Children: N/A  . Years of Education: N/A   Occupational History  . Not on file.   Social History Main Topics  . Smoking status: Former Smoker -- 1.00 packs/day for 42 years    Types: Cigarettes    Quit date: 04/07/2001  . Smokeless tobacco: Never Used  . Alcohol Use: No  . Drug Use: No  . Sexual Activity: Not on file   Other Topics Concern  . Not on file   Social History Narrative        Medication List       This list is accurate as of: 07/26/15 11:59 PM.  Always use your  most recent med list.               ACCU-CHEK AVIVA PLUS test strip  Generic drug:  glucose blood  TEST AS DIRECTED THREE TIMES DAILY     amiodarone 200 MG tablet  Commonly known as:  PACERONE  Take 1 tablet (200 mg total) by mouth 2 (two) times daily.     amoxicillin 875 MG tablet  Commonly known as:  AMOXIL  Take 1 tablet (875 mg total) by mouth 2 (two) times daily.     amoxicillin-clavulanate 875-125 MG tablet  Commonly known as:  AUGMENTIN  Take 1 tablet by mouth 2 (two) times daily.     apixaban 5 MG Tabs tablet  Commonly known as:  ELIQUIS  Take 1 tablet (5 mg total) by mouth 2 (two) times daily.     atorvastatin 80 MG tablet  Commonly known as:  LIPITOR  Take 1 tablet (80 mg total) by mouth daily.     azelastine 0.1 % nasal spray  Commonly known as:  ASTELIN  Place 2 sprays into both  nostrils at bedtime as needed for rhinitis. Use in each nostril as directed     BREO ELLIPTA IN  Inhale 1 puff into the lungs.     buPROPion 300 MG 24 hr tablet  Commonly known as:  WELLBUTRIN XL  TAKE 1 TABLET(300 MG) BY MOUTH DAILY     diltiazem 120 MG 24 hr capsule  Commonly known as:  CARDIZEM CD  Take 1 capsule (120 mg total) by mouth daily.     doxercalciferol 2.5 MCG capsule  Commonly known as:  HECTOROL  TABLET 1 CAPSULE BY MOUTH DAILY     DULoxetine 60 MG capsule  Commonly known as:  CYMBALTA  Take 1 capsule (60 mg total) by mouth daily.     ergocalciferol 50000 units capsule  Commonly known as:  VITAMIN D2  Take 1 capsule (50,000 Units total) by mouth every 30 (thirty) days.     fluticasone 50 MCG/ACT nasal spray  Commonly known as:  FLONASE  Place 2 sprays into both nostrils daily.     furosemide 40 MG tablet  Commonly known as:  LASIX  Take 40-80 mg by mouth daily.     gabapentin 300 MG capsule  Commonly known as:  NEURONTIN  TAKE 2 CAPSULES BY MOUTH THREE TIMES DAILY     Insulin Glargine 300 UNIT/ML Sopn  Commonly known as:  TOUJEO SOLOSTAR  Inject 60 Units into the skin every morning.     insulin lispro 100 UNIT/ML KiwkPen  Commonly known as:  HUMALOG KWIKPEN  Inject 20 units three times a day.     Insulin Syringe-Needle U-100 31G X 5/16" 0.3 ML Misc  Commonly known as:  LITETOUCH INSULIN SYRINGE  Use as directed     ipratropium-albuterol 0.5-2.5 (3) MG/3ML Soln  Commonly known as:  DUONEB  Take 3 mLs by nebulization every 6 (six) hours as needed.     ketoconazole 2 % shampoo  Commonly known as:  NIZORAL  Apply 1 application topically 2 (two) times a week.     levothyroxine 112 MCG tablet  Commonly known as:  SYNTHROID, LEVOTHROID  Take 112 mcg by mouth daily before breakfast.     metoCLOPramide 5 MG tablet  Commonly known as:  REGLAN  Take 1 tablet (5 mg total) by mouth 2 (two) times daily before lunch and supper.     montelukast 10 MG  tablet  Commonly known as:  SINGULAIR  Take 10 mg by mouth at bedtime.     nebivolol 10 MG tablet  Commonly known as:  BYSTOLIC  Take 10 mg by mouth daily.     nystatin 100000 UNIT/ML suspension  Commonly known as:  MYCOSTATIN  Take 5 mLs (500,000 Units total) by mouth 4 (four) times daily.     olopatadine 0.1 % ophthalmic solution  Commonly known as:  PATANOL  Place 1 drop into both eyes 2 (two) times daily as needed.     omeprazole 20 MG capsule  Commonly known as:  PRILOSEC  Take 1 capsule (20 mg total) by mouth daily.     ondansetron 4 MG tablet  Commonly known as:  ZOFRAN  Take 1 tablet (4 mg total) by mouth every 8 (eight) hours as needed for nausea or vomiting.     OXYGEN  Inhale into the lungs. 2L all day and night     predniSONE 10 MG tablet  Commonly known as:  DELTASONE  Take 1 tablet (10 mg total) by mouth daily. 2 tabs a day x 5 days     ranolazine 500 MG 12 hr tablet  Commonly known as:  RANEXA  Take 1 tablet (500 mg total) by mouth 2 (two) times daily.     roflumilast 500 MCG Tabs tablet  Commonly known as:  DALIRESP  Take 500 mcg by mouth daily.     spironolactone 25 MG tablet  Commonly known as:  ALDACTONE  Take 25 mg by mouth daily.     Testosterone 20.25 MG/1.25GM (1.62%) Gel  Commonly known as:  ANDROGEL  Apply 3 pumps on each arm daily (6) pumps total     tiotropium 18 MCG inhalation capsule  Commonly known as:  SPIRIVA  Place 18 mcg into inhaler and inhale daily.           Objective:   Physical Exam BP 124/68 mmHg  Pulse 98  Temp(Src) 97.8 F (36.6 C) (Oral)  Ht 5\' 11"  (1.803 m)  Wt 214 lb 4 oz (97.183 kg)  BMI 29.90 kg/m2  SpO2 96% General:   Well developed, well nourished . NAD.  HEENT:  Normocephalic . Face symmetric, atraumatic. TMs normal, nose not congested, sinuses no TTP. Slightly pale. Lungs:  Decrease. Sounds, clear. Not on oxygen supplementation, O2 sat 96 Normal respiratory effort, no intercostal retractions,  no accessory muscle use. Heart: RRR,  mild murmur.  Trace pretibial edema bilaterally  Abdomen:  Not distended, soft, non-tender. No rebound or rigidity.  Skin: Not pale. Not jaundice Neurologic:  alert & oriented X3.  Speech normal, gait appropriate for age and unassisted Psych--  Cognition and judgment appear intact.  Cooperative with normal attention span and concentration.  Behavior appropriate. No anxious or depressed appearing.    Assessment & Plan:  Patient with multiple medical problems including: COPD, on home oxygen, inhalers and nebulizer. diabetes on insulin CAD, atrial fibrillation on anticoagulants and amiodarone  Presents today with ongoing respiratory symptoms including chest pain that is per patient "definitely worse" when he takes a deep breath. He is anticoagulated, O2 sat satisfactory, minimal lower extremity edema despite not taking Lasix for a couple of days. Looks slightly pale.  Plan: Chest x-ray, CBC. Amoxicillin if not improving in few days, I am reluctant to start antibiotics today unless he has pneumonia. He is at risk of C. difficile Prednisone for 5 days, monitor CBGs. Continue inhalers and encouraged nebs  at least twice a day for now [employer], start  using Mucinex DM, Astelin Continue Flonase ER if chest pain increases/changes or not improving. Also fever chills or severe symptoms Restart Lasix RTC 10 days with PCP  Addendum: Chest x-ray show a persistent infiltrate, see report below. Plan: Do not take the amoxicillin, change to Augmentin (Avelox may create QT problems) follow-up 10 days. D/w PCP  CXR IMPRESSION: COPD. Posterior left lower lobe infiltrate. There is no pleural effusion or evidence of CHF.  Followup PA and lateral chest X-ray is recommended in 3-4 weeks following trial of antibiotic therapy to ensure resolution and exclude underlying malignancy.

## 2015-07-27 ENCOUNTER — Ambulatory Visit: Payer: Self-pay | Admitting: Gastroenterology

## 2015-08-01 ENCOUNTER — Encounter (HOSPITAL_BASED_OUTPATIENT_CLINIC_OR_DEPARTMENT_OTHER): Payer: Self-pay | Admitting: *Deleted

## 2015-08-01 ENCOUNTER — Ambulatory Visit (INDEPENDENT_AMBULATORY_CARE_PROVIDER_SITE_OTHER): Payer: Medicare Other | Admitting: Physician Assistant

## 2015-08-01 ENCOUNTER — Inpatient Hospital Stay (HOSPITAL_BASED_OUTPATIENT_CLINIC_OR_DEPARTMENT_OTHER)
Admission: EM | Admit: 2015-08-01 | Discharge: 2015-08-13 | DRG: 313 | Disposition: A | Discharge status: Skilled Nursing Facility | Payer: Medicare Other | Attending: Internal Medicine | Admitting: Internal Medicine

## 2015-08-01 ENCOUNTER — Emergency Department (HOSPITAL_BASED_OUTPATIENT_CLINIC_OR_DEPARTMENT_OTHER): Payer: Medicare Other

## 2015-08-01 ENCOUNTER — Encounter: Payer: Self-pay | Admitting: Physician Assistant

## 2015-08-01 VITALS — BP 90/50 | HR 111 | Temp 97.7°F | Ht 71.0 in | Wt 205.4 lb

## 2015-08-01 DIAGNOSIS — Z7189 Other specified counseling: Secondary | ICD-10-CM | POA: Diagnosis not present

## 2015-08-01 DIAGNOSIS — I130 Hypertensive heart and chronic kidney disease with heart failure and stage 1 through stage 4 chronic kidney disease, or unspecified chronic kidney disease: Secondary | ICD-10-CM | POA: Diagnosis present

## 2015-08-01 DIAGNOSIS — K219 Gastro-esophageal reflux disease without esophagitis: Secondary | ICD-10-CM | POA: Diagnosis present

## 2015-08-01 DIAGNOSIS — G9340 Encephalopathy, unspecified: Secondary | ICD-10-CM | POA: Diagnosis not present

## 2015-08-01 DIAGNOSIS — E871 Hypo-osmolality and hyponatremia: Secondary | ICD-10-CM | POA: Diagnosis present

## 2015-08-01 DIAGNOSIS — E860 Dehydration: Secondary | ICD-10-CM | POA: Diagnosis present

## 2015-08-01 DIAGNOSIS — K228 Other specified diseases of esophagus: Secondary | ICD-10-CM | POA: Diagnosis present

## 2015-08-01 DIAGNOSIS — F331 Major depressive disorder, recurrent, moderate: Secondary | ICD-10-CM | POA: Diagnosis present

## 2015-08-01 DIAGNOSIS — Z8679 Personal history of other diseases of the circulatory system: Secondary | ICD-10-CM | POA: Diagnosis not present

## 2015-08-01 DIAGNOSIS — I959 Hypotension, unspecified: Secondary | ICD-10-CM | POA: Diagnosis present

## 2015-08-01 DIAGNOSIS — J90 Pleural effusion, not elsewhere classified: Secondary | ICD-10-CM | POA: Diagnosis not present

## 2015-08-01 DIAGNOSIS — E878 Other disorders of electrolyte and fluid balance, not elsewhere classified: Secondary | ICD-10-CM | POA: Diagnosis not present

## 2015-08-01 DIAGNOSIS — IMO0002 Reserved for concepts with insufficient information to code with codable children: Secondary | ICD-10-CM | POA: Diagnosis present

## 2015-08-01 DIAGNOSIS — R0789 Other chest pain: Secondary | ICD-10-CM | POA: Diagnosis present

## 2015-08-01 DIAGNOSIS — Z87891 Personal history of nicotine dependence: Secondary | ICD-10-CM

## 2015-08-01 DIAGNOSIS — Z794 Long term (current) use of insulin: Secondary | ICD-10-CM | POA: Diagnosis not present

## 2015-08-01 DIAGNOSIS — E1165 Type 2 diabetes mellitus with hyperglycemia: Secondary | ICD-10-CM | POA: Diagnosis present

## 2015-08-01 DIAGNOSIS — N401 Enlarged prostate with lower urinary tract symptoms: Secondary | ICD-10-CM | POA: Diagnosis present

## 2015-08-01 DIAGNOSIS — I480 Paroxysmal atrial fibrillation: Secondary | ICD-10-CM | POA: Diagnosis present

## 2015-08-01 DIAGNOSIS — R059 Cough, unspecified: Secondary | ICD-10-CM

## 2015-08-01 DIAGNOSIS — I440 Atrioventricular block, first degree: Secondary | ICD-10-CM | POA: Diagnosis present

## 2015-08-01 DIAGNOSIS — R319 Hematuria, unspecified: Secondary | ICD-10-CM | POA: Diagnosis not present

## 2015-08-01 DIAGNOSIS — I350 Nonrheumatic aortic (valve) stenosis: Secondary | ICD-10-CM | POA: Diagnosis present

## 2015-08-01 DIAGNOSIS — D72829 Elevated white blood cell count, unspecified: Secondary | ICD-10-CM

## 2015-08-01 DIAGNOSIS — R778 Other specified abnormalities of plasma proteins: Secondary | ICD-10-CM

## 2015-08-01 DIAGNOSIS — E1100 Type 2 diabetes mellitus with hyperosmolarity without nonketotic hyperglycemic-hyperosmolar coma (NKHHC): Secondary | ICD-10-CM

## 2015-08-01 DIAGNOSIS — Z8701 Personal history of pneumonia (recurrent): Secondary | ICD-10-CM

## 2015-08-01 DIAGNOSIS — J029 Acute pharyngitis, unspecified: Secondary | ICD-10-CM | POA: Diagnosis present

## 2015-08-01 DIAGNOSIS — R0781 Pleurodynia: Secondary | ICD-10-CM

## 2015-08-01 DIAGNOSIS — Z66 Do not resuscitate: Secondary | ICD-10-CM | POA: Diagnosis not present

## 2015-08-01 DIAGNOSIS — Q390 Atresia of esophagus without fistula: Secondary | ICD-10-CM

## 2015-08-01 DIAGNOSIS — R338 Other retention of urine: Secondary | ICD-10-CM | POA: Diagnosis present

## 2015-08-01 DIAGNOSIS — E1122 Type 2 diabetes mellitus with diabetic chronic kidney disease: Secondary | ICD-10-CM | POA: Diagnosis present

## 2015-08-01 DIAGNOSIS — R1110 Vomiting, unspecified: Secondary | ICD-10-CM | POA: Diagnosis not present

## 2015-08-01 DIAGNOSIS — I5033 Acute on chronic diastolic (congestive) heart failure: Secondary | ICD-10-CM | POA: Diagnosis not present

## 2015-08-01 DIAGNOSIS — E038 Other specified hypothyroidism: Secondary | ICD-10-CM | POA: Diagnosis present

## 2015-08-01 DIAGNOSIS — B3749 Other urogenital candidiasis: Secondary | ICD-10-CM | POA: Diagnosis present

## 2015-08-01 DIAGNOSIS — Z7951 Long term (current) use of inhaled steroids: Secondary | ICD-10-CM

## 2015-08-01 DIAGNOSIS — N179 Acute kidney failure, unspecified: Secondary | ICD-10-CM | POA: Diagnosis not present

## 2015-08-01 DIAGNOSIS — R0600 Dyspnea, unspecified: Secondary | ICD-10-CM | POA: Diagnosis not present

## 2015-08-01 DIAGNOSIS — J810 Acute pulmonary edema: Secondary | ICD-10-CM | POA: Diagnosis not present

## 2015-08-01 DIAGNOSIS — Z9981 Dependence on supplemental oxygen: Secondary | ICD-10-CM | POA: Diagnosis not present

## 2015-08-01 DIAGNOSIS — R079 Chest pain, unspecified: Secondary | ICD-10-CM | POA: Diagnosis not present

## 2015-08-01 DIAGNOSIS — N189 Chronic kidney disease, unspecified: Secondary | ICD-10-CM | POA: Diagnosis not present

## 2015-08-01 DIAGNOSIS — E1140 Type 2 diabetes mellitus with diabetic neuropathy, unspecified: Secondary | ICD-10-CM | POA: Diagnosis present

## 2015-08-01 DIAGNOSIS — N184 Chronic kidney disease, stage 4 (severe): Secondary | ICD-10-CM | POA: Diagnosis present

## 2015-08-01 DIAGNOSIS — J9611 Chronic respiratory failure with hypoxia: Secondary | ICD-10-CM | POA: Diagnosis present

## 2015-08-01 DIAGNOSIS — Z8711 Personal history of peptic ulcer disease: Secondary | ICD-10-CM

## 2015-08-01 DIAGNOSIS — Z7901 Long term (current) use of anticoagulants: Secondary | ICD-10-CM | POA: Diagnosis not present

## 2015-08-01 DIAGNOSIS — E872 Acidosis: Secondary | ICD-10-CM | POA: Diagnosis not present

## 2015-08-01 DIAGNOSIS — R072 Precordial pain: Secondary | ICD-10-CM

## 2015-08-01 DIAGNOSIS — Z8501 Personal history of malignant neoplasm of esophagus: Secondary | ICD-10-CM | POA: Diagnosis not present

## 2015-08-01 DIAGNOSIS — N182 Chronic kidney disease, stage 2 (mild): Secondary | ICD-10-CM | POA: Diagnosis not present

## 2015-08-01 DIAGNOSIS — J449 Chronic obstructive pulmonary disease, unspecified: Secondary | ICD-10-CM | POA: Diagnosis present

## 2015-08-01 DIAGNOSIS — I10 Essential (primary) hypertension: Secondary | ICD-10-CM | POA: Diagnosis not present

## 2015-08-01 DIAGNOSIS — N183 Chronic kidney disease, stage 3 unspecified: Secondary | ICD-10-CM

## 2015-08-01 DIAGNOSIS — Z515 Encounter for palliative care: Secondary | ICD-10-CM | POA: Diagnosis not present

## 2015-08-01 DIAGNOSIS — D638 Anemia in other chronic diseases classified elsewhere: Secondary | ICD-10-CM | POA: Diagnosis present

## 2015-08-01 DIAGNOSIS — I313 Pericardial effusion (noninflammatory): Secondary | ICD-10-CM | POA: Diagnosis present

## 2015-08-01 DIAGNOSIS — Z79890 Hormone replacement therapy (postmenopausal): Secondary | ICD-10-CM | POA: Diagnosis not present

## 2015-08-01 DIAGNOSIS — R05 Cough: Secondary | ICD-10-CM

## 2015-08-01 DIAGNOSIS — E785 Hyperlipidemia, unspecified: Secondary | ICD-10-CM | POA: Diagnosis present

## 2015-08-01 DIAGNOSIS — I2510 Atherosclerotic heart disease of native coronary artery without angina pectoris: Secondary | ICD-10-CM | POA: Diagnosis present

## 2015-08-01 DIAGNOSIS — M791 Myalgia: Secondary | ICD-10-CM | POA: Diagnosis present

## 2015-08-01 DIAGNOSIS — Z9861 Coronary angioplasty status: Secondary | ICD-10-CM | POA: Diagnosis not present

## 2015-08-01 DIAGNOSIS — Z91048 Other nonmedicinal substance allergy status: Secondary | ICD-10-CM | POA: Diagnosis not present

## 2015-08-01 DIAGNOSIS — E875 Hyperkalemia: Secondary | ICD-10-CM | POA: Diagnosis not present

## 2015-08-01 DIAGNOSIS — I3139 Other pericardial effusion (noninflammatory): Secondary | ICD-10-CM | POA: Diagnosis present

## 2015-08-01 DIAGNOSIS — N390 Urinary tract infection, site not specified: Secondary | ICD-10-CM | POA: Diagnosis not present

## 2015-08-01 DIAGNOSIS — E063 Autoimmune thyroiditis: Secondary | ICD-10-CM | POA: Diagnosis present

## 2015-08-01 DIAGNOSIS — Z9049 Acquired absence of other specified parts of digestive tract: Secondary | ICD-10-CM | POA: Diagnosis not present

## 2015-08-01 DIAGNOSIS — N19 Unspecified kidney failure: Secondary | ICD-10-CM

## 2015-08-01 DIAGNOSIS — R739 Hyperglycemia, unspecified: Secondary | ICD-10-CM | POA: Diagnosis present

## 2015-08-01 DIAGNOSIS — R0602 Shortness of breath: Secondary | ICD-10-CM

## 2015-08-01 DIAGNOSIS — Z7401 Bed confinement status: Secondary | ICD-10-CM

## 2015-08-01 DIAGNOSIS — R7989 Other specified abnormal findings of blood chemistry: Secondary | ICD-10-CM

## 2015-08-01 DIAGNOSIS — J9610 Chronic respiratory failure, unspecified whether with hypoxia or hypercapnia: Secondary | ICD-10-CM | POA: Diagnosis present

## 2015-08-01 DIAGNOSIS — I5032 Chronic diastolic (congestive) heart failure: Secondary | ICD-10-CM | POA: Diagnosis present

## 2015-08-01 LAB — CBG MONITORING, ED: Glucose-Capillary: 442 mg/dL — ABNORMAL HIGH (ref 65–99)

## 2015-08-01 LAB — GLUCOSE, CAPILLARY: Glucose-Capillary: 335 mg/dL — ABNORMAL HIGH (ref 65–99)

## 2015-08-01 LAB — COMPREHENSIVE METABOLIC PANEL
ALT: 9 U/L — AB (ref 17–63)
AST: 12 U/L — AB (ref 15–41)
Albumin: 2.7 g/dL — ABNORMAL LOW (ref 3.5–5.0)
Alkaline Phosphatase: 109 U/L (ref 38–126)
Anion gap: 8 (ref 5–15)
BUN: 52 mg/dL — AB (ref 6–20)
CHLORIDE: 102 mmol/L (ref 101–111)
CO2: 18 mmol/L — ABNORMAL LOW (ref 22–32)
CREATININE: 2.88 mg/dL — AB (ref 0.61–1.24)
Calcium: 8 mg/dL — ABNORMAL LOW (ref 8.9–10.3)
GFR calc Af Amer: 23 mL/min — ABNORMAL LOW (ref >60)
GFR, EST NON AFRICAN AMERICAN: 20 mL/min — AB (ref >60)
Glucose, Bld: 504 mg/dL — ABNORMAL HIGH (ref 65–99)
Potassium: 4.7 mmol/L (ref 3.5–5.1)
Sodium: 128 mmol/L — ABNORMAL LOW (ref 135–145)
Total Bilirubin: 0.8 mg/dL (ref 0.3–1.2)
Total Protein: 6.1 g/dL — ABNORMAL LOW (ref 6.5–8.1)

## 2015-08-01 LAB — CBC
HCT: 30.7 % — ABNORMAL LOW (ref 39.0–52.0)
Hemoglobin: 10.2 g/dL — ABNORMAL LOW (ref 13.0–17.0)
MCH: 29.8 pg (ref 26.0–34.0)
MCHC: 33.2 g/dL (ref 30.0–36.0)
MCV: 89.8 fL (ref 78.0–100.0)
PLATELETS: 224 10*3/uL (ref 150–400)
RBC: 3.42 MIL/uL — ABNORMAL LOW (ref 4.22–5.81)
RDW: 13.2 % (ref 11.5–15.5)
WBC: 21.4 10*3/uL — AB (ref 4.0–10.5)

## 2015-08-01 LAB — BRAIN NATRIURETIC PEPTIDE: B NATRIURETIC PEPTIDE 5: 483.1 pg/mL — AB (ref 0.0–100.0)

## 2015-08-01 LAB — TROPONIN I
TROPONIN I: 0.07 ng/mL — AB (ref <0.031)
Troponin I: 0.06 ng/mL — ABNORMAL HIGH (ref <0.031)

## 2015-08-01 LAB — I-STAT CG4 LACTIC ACID, ED: Lactic Acid, Venous: 1.03 mmol/L (ref 0.5–2.0)

## 2015-08-01 LAB — PROCALCITONIN: Procalcitonin: 1.31 ng/mL

## 2015-08-01 MED ORDER — SODIUM CHLORIDE 0.9 % IV SOLN: INTRAVENOUS | Status: DC

## 2015-08-01 MED ORDER — NEBIVOLOL HCL 5 MG PO TABS
10.0000 mg | ORAL_TABLET | Freq: Every day | ORAL | Status: DC
  Administered 2015-08-02 – 2015-08-03 (×2): 10 mg via ORAL
  Filled 2015-08-01 (×2): qty 2

## 2015-08-01 MED ORDER — INSULIN GLARGINE 100 UNIT/ML ~~LOC~~ SOLN
60.0000 [IU] | Freq: Every day | SUBCUTANEOUS | Status: DC
  Administered 2015-08-01: 60 [IU] via SUBCUTANEOUS
  Filled 2015-08-01 (×2): qty 0.6

## 2015-08-01 MED ORDER — DULOXETINE HCL 60 MG PO CPEP
60.0000 mg | ORAL_CAPSULE | Freq: Every day | ORAL | Status: DC
  Administered 2015-08-02 – 2015-08-13 (×12): 60 mg via ORAL
  Filled 2015-08-01 (×12): qty 1

## 2015-08-01 MED ORDER — DOXERCALCIFEROL 2.5 MCG PO CAPS
2.5000 ug | ORAL_CAPSULE | Freq: Every day | ORAL | Status: DC
  Administered 2015-08-02 – 2015-08-13 (×12): 2.5 ug via ORAL
  Filled 2015-08-01 (×12): qty 1

## 2015-08-01 MED ORDER — MONTELUKAST SODIUM 10 MG PO TABS
10.0000 mg | ORAL_TABLET | Freq: Every day | ORAL | Status: DC
  Administered 2015-08-01 – 2015-08-12 (×12): 10 mg via ORAL
  Filled 2015-08-01 (×12): qty 1

## 2015-08-01 MED ORDER — ASPIRIN 81 MG PO CHEW
324.0000 mg | CHEWABLE_TABLET | ORAL | Status: AC
  Administered 2015-08-01: 324 mg via ORAL
  Filled 2015-08-01: qty 4

## 2015-08-01 MED ORDER — SODIUM CHLORIDE 0.9 % IV BOLUS (SEPSIS): 500.0000 mL | Freq: Once | INTRAVENOUS | Status: DC

## 2015-08-01 MED ORDER — RANOLAZINE ER 500 MG PO TB12
500.0000 mg | ORAL_TABLET | Freq: Two times a day (BID) | ORAL | Status: DC
  Administered 2015-08-01 – 2015-08-13 (×24): 500 mg via ORAL
  Filled 2015-08-01 (×24): qty 1

## 2015-08-01 MED ORDER — METOCLOPRAMIDE HCL 5 MG PO TABS: 5.0000 mg | ORAL_TABLET | Freq: Two times a day (BID) | ORAL | Status: DC

## 2015-08-01 MED ORDER — FLUTICASONE FUROATE-VILANTEROL 100-25 MCG/INH IN AEPB
1.0000 | INHALATION_SPRAY | Freq: Every day | RESPIRATORY_TRACT | Status: DC
  Administered 2015-08-02 – 2015-08-08 (×7): 1 via RESPIRATORY_TRACT
  Filled 2015-08-01: qty 28

## 2015-08-01 MED ORDER — ROFLUMILAST 500 MCG PO TABS
500.0000 ug | ORAL_TABLET | Freq: Every day | ORAL | Status: DC
  Administered 2015-08-02 – 2015-08-13 (×12): 500 ug via ORAL
  Filled 2015-08-01 (×12): qty 1

## 2015-08-01 MED ORDER — DILTIAZEM HCL ER COATED BEADS 120 MG PO CP24: 120.0000 mg | ORAL_CAPSULE | Freq: Every day | ORAL | Status: DC

## 2015-08-01 MED ORDER — GABAPENTIN 300 MG PO CAPS
600.0000 mg | ORAL_CAPSULE | Freq: Three times a day (TID) | ORAL | Status: DC
  Administered 2015-08-01 – 2015-08-09 (×22): 600 mg via ORAL
  Filled 2015-08-01 (×22): qty 2

## 2015-08-01 MED ORDER — IPRATROPIUM-ALBUTEROL 0.5-2.5 (3) MG/3ML IN SOLN
3.0000 mL | RESPIRATORY_TRACT | Status: DC | PRN
  Administered 2015-08-05 – 2015-08-08 (×6): 3 mL via RESPIRATORY_TRACT
  Filled 2015-08-01 (×7): qty 3

## 2015-08-01 MED ORDER — BUPROPION HCL ER (XL) 150 MG PO TB24
300.0000 mg | ORAL_TABLET | Freq: Every day | ORAL | Status: DC
  Administered 2015-08-02 – 2015-08-13 (×12): 300 mg via ORAL
  Filled 2015-08-01 (×13): qty 2

## 2015-08-01 MED ORDER — ONDANSETRON HCL 4 MG PO TABS
4.0000 mg | ORAL_TABLET | Freq: Three times a day (TID) | ORAL | Status: DC | PRN
  Filled 2015-08-01: qty 1

## 2015-08-01 MED ORDER — ONDANSETRON HCL 4 MG/2ML IJ SOLN: 4.0000 mg | Freq: Four times a day (QID) | INTRAMUSCULAR | Status: DC | PRN

## 2015-08-01 MED ORDER — NITROGLYCERIN 0.4 MG SL SUBL
0.4000 mg | SUBLINGUAL_TABLET | SUBLINGUAL | Status: DC | PRN
  Filled 2015-08-01: qty 1

## 2015-08-01 MED ORDER — ACETAMINOPHEN 325 MG PO TABS
650.0000 mg | ORAL_TABLET | ORAL | Status: DC | PRN
  Administered 2015-08-02 – 2015-08-05 (×4): 650 mg via ORAL
  Filled 2015-08-01 (×3): qty 2

## 2015-08-01 MED ORDER — LEVOTHYROXINE SODIUM 112 MCG PO TABS
112.0000 ug | ORAL_TABLET | Freq: Every day | ORAL | Status: DC
  Administered 2015-08-02 – 2015-08-13 (×12): 112 ug via ORAL
  Filled 2015-08-01 (×13): qty 1

## 2015-08-01 MED ORDER — APIXABAN 5 MG PO TABS
5.0000 mg | ORAL_TABLET | Freq: Two times a day (BID) | ORAL | Status: DC
  Administered 2015-08-01 – 2015-08-07 (×12): 5 mg via ORAL
  Filled 2015-08-01 (×12): qty 1

## 2015-08-01 MED ORDER — TIOTROPIUM BROMIDE MONOHYDRATE 18 MCG IN CAPS
18.0000 ug | ORAL_CAPSULE | Freq: Every day | RESPIRATORY_TRACT | Status: DC
  Administered 2015-08-02 – 2015-08-08 (×7): 18 ug via RESPIRATORY_TRACT
  Filled 2015-08-01 (×2): qty 5

## 2015-08-01 MED ORDER — SODIUM CHLORIDE 0.9 % IV BOLUS (SEPSIS)
1000.0000 mL | Freq: Once | INTRAVENOUS | Status: AC
  Administered 2015-08-01: 1000 mL via INTRAVENOUS

## 2015-08-01 MED ORDER — ATORVASTATIN CALCIUM 80 MG PO TABS
80.0000 mg | ORAL_TABLET | Freq: Every day | ORAL | Status: DC
  Administered 2015-08-02 – 2015-08-13 (×12): 80 mg via ORAL
  Filled 2015-08-01 (×12): qty 1

## 2015-08-01 MED ORDER — TESTOSTERONE 50 MG/5GM (1%) TD GEL
5.0000 g | Freq: Every day | TRANSDERMAL | Status: DC
  Administered 2015-08-02 – 2015-08-13 (×12): 5 g via TRANSDERMAL
  Filled 2015-08-01 (×12): qty 5

## 2015-08-01 MED ORDER — MORPHINE SULFATE (PF) 2 MG/ML IV SOLN
2.0000 mg | INTRAVENOUS | Status: DC | PRN
  Administered 2015-08-01 – 2015-08-03 (×4): 2 mg via INTRAVENOUS
  Filled 2015-08-01 (×4): qty 1

## 2015-08-01 MED ORDER — INSULIN ASPART 100 UNIT/ML ~~LOC~~ SOLN
12.0000 [IU] | Freq: Once | SUBCUTANEOUS | Status: AC
  Administered 2015-08-01: 12 [IU] via SUBCUTANEOUS
  Filled 2015-08-01: qty 1

## 2015-08-01 MED ORDER — INSULIN ASPART 100 UNIT/ML ~~LOC~~ SOLN
0.0000 [IU] | Freq: Three times a day (TID) | SUBCUTANEOUS | Status: DC
  Administered 2015-08-02: 11 [IU] via SUBCUTANEOUS
  Administered 2015-08-02: 5 [IU] via SUBCUTANEOUS
  Administered 2015-08-03 (×3): 3 [IU] via SUBCUTANEOUS
  Administered 2015-08-04 – 2015-08-06 (×5): 2 [IU] via SUBCUTANEOUS
  Administered 2015-08-06: 8 [IU] via SUBCUTANEOUS
  Administered 2015-08-07: 3 [IU] via SUBCUTANEOUS
  Administered 2015-08-07: 2 [IU] via SUBCUTANEOUS
  Administered 2015-08-10 – 2015-08-11 (×4): 3 [IU] via SUBCUTANEOUS
  Administered 2015-08-12 (×2): 2 [IU] via SUBCUTANEOUS
  Administered 2015-08-12 – 2015-08-13 (×2): 3 [IU] via SUBCUTANEOUS
  Administered 2015-08-13: 2 [IU] via SUBCUTANEOUS

## 2015-08-01 MED ORDER — ASPIRIN EC 81 MG PO TBEC
81.0000 mg | DELAYED_RELEASE_TABLET | Freq: Every day | ORAL | Status: DC
  Administered 2015-08-02 – 2015-08-09 (×8): 81 mg via ORAL
  Filled 2015-08-01 (×9): qty 1

## 2015-08-01 MED ORDER — TRAMADOL HCL 50 MG PO TABS
50.0000 mg | ORAL_TABLET | Freq: Once | ORAL | Status: AC
  Administered 2015-08-01: 50 mg via ORAL
  Filled 2015-08-01: qty 1

## 2015-08-01 MED ORDER — PANTOPRAZOLE SODIUM 40 MG PO TBEC
40.0000 mg | DELAYED_RELEASE_TABLET | Freq: Every day | ORAL | Status: DC
  Administered 2015-08-02 – 2015-08-10 (×9): 40 mg via ORAL
  Filled 2015-08-01 (×9): qty 1

## 2015-08-01 MED ORDER — ASPIRIN 300 MG RE SUPP: 300.0000 mg | RECTAL | Status: AC

## 2015-08-01 MED ORDER — OLOPATADINE HCL 0.1 % OP SOLN
1.0000 [drp] | Freq: Two times a day (BID) | OPHTHALMIC | Status: DC
  Administered 2015-08-02 – 2015-08-03 (×2): 1 [drp] via OPHTHALMIC
  Filled 2015-08-01: qty 5

## 2015-08-01 MED ORDER — AMIODARONE HCL 200 MG PO TABS
200.0000 mg | ORAL_TABLET | Freq: Two times a day (BID) | ORAL | Status: DC
  Administered 2015-08-01 – 2015-08-09 (×16): 200 mg via ORAL
  Filled 2015-08-01 (×16): qty 1

## 2015-08-01 MED ORDER — INSULIN ASPART 100 UNIT/ML ~~LOC~~ SOLN
4.0000 [IU] | Freq: Three times a day (TID) | SUBCUTANEOUS | Status: DC
  Administered 2015-08-02 (×2): 4 [IU] via SUBCUTANEOUS

## 2015-08-01 MED ORDER — FLUTICASONE PROPIONATE 50 MCG/ACT NA SUSP
2.0000 | Freq: Every day | NASAL | Status: DC
  Administered 2015-08-02 – 2015-08-13 (×12): 2 via NASAL
  Filled 2015-08-01: qty 16

## 2015-08-01 NOTE — Progress Notes (Signed)
Pre visit review using our clinic review tool, if applicable. No additional management support is needed unless otherwise documented below in the visit note. 

## 2015-08-01 NOTE — Progress Notes (Signed)
80 y.o. male presenting with chest pain hx copd, dm, afib, c/o upper/anterior chest pain with coughing in the past week. States last week  was diagnosed with pneumonia - nearly completed his abx, presents with cp, cbg 504 , BP low in 90, received sq insulin , Requested ER physician to repeat his blood pressure his CBG prior prior to transfer, patient admitted to a telemetry bed for management of this chest pain,leukocytosis,dehydration and elevated CBGs

## 2015-08-01 NOTE — ED Provider Notes (Signed)
CSN: [id number]     Arrival date & time 08/01/15  1519 History   First MD Initiated Contact with Patient 08/01/15 1530     Chief Complaint  Patient presents with  . Chest Pain     (Consider location/radiation/quality/duration/timing/severity/associated sxs/prior Treatment) Patient is a 80 y.o. male presenting with chest pain. The history is provided by the patient.  Chest Pain Associated symptoms: cough and shortness of breath   Associated symptoms: no abdominal pain, no back pain, no fever, no headache, no palpitations and not vomiting   Patient with hx copd, dm, afib, c/o upper/anterior chest pain with coughing in the past week. States last week had cxr, and was diagnosed with pneumonia - states has one day of abx tx left.  States w cough or coughing spell, dull, anterior chest pain, moderate, non radiating. No pain w normal breathing. Mild scratchy throat. Right ear ache. No hearing loss or tinnitus. No known other ill contacts. Mild sob, esp with coughing spell. Compliant w normal meds, except hasnt had lasix as didn't want to have to keep getting up to use bathroom. No leg pain or swelling. No pnd. No fever or chills. No exertional cp. No associated diaphoresis or nv.      Past Medical History  Diagnosis Date  . COPD (chronic obstructive pulmonary disease) ([place])   . CHF (congestive heart failure) ([place])   . Thyroid disease   . Chronic bronchitis ([place])   . Atrial fibrillation ([place])   . CKD (chronic kidney disease)   . Diabetes mellitus without complication ([place])   . Allergy   . Esophageal cancer (HCC)     esophogeal   . CKD (chronic kidney disease)   . Depression   . Hypertension   . Colon polyps   . CAD (coronary artery disease)   . Hyperlipidemia   . GERD (gastroesophageal reflux disease)   . History of renal stone   . Stroke [facility])     ? TIA per pt  . Shortness of breath dyspnea     with exertion  . Pneumonia   . Dyslexia   . Enlarged prostate   . Headache     hx  of - none in years  . Anemia   . Loose stools   . Flatulence/gas pain/belching    Past Surgical History  Procedure Laterality Date  . Esophagus surgery  2006  . Spine surgery      bone spurs  . Cardiac catheterization  2009    two stents placed  . Tonsillectomy    . Eye surgery Bilateral     cataract surgery with lens implants  . Colonoscopy    . Esophagogastroduodenoscopy (egd) with propofol N/A 05/16/2015    Procedure: ESOPHAGOGASTRODUODENOSCOPY (EGD) WITH PROPOFOL;  Surgeon: Mauri Pole, MD;  Location: [facility];  Service: Endoscopy;  Laterality: N/A;   Family History  Problem Relation Age of Onset  . COPD Father   . Diabetes Neg Hx    Social History  Substance Use Topics  . Smoking status: Former Smoker -- 1.00 packs/day for 42 years    Types: Cigarettes    Quit date: 04/07/2001  . Smokeless tobacco: Never Used  . Alcohol Use: No    Review of Systems  Constitutional: Negative for fever.  HENT: Positive for congestion and sore throat.   Eyes: Negative for discharge and redness.  Respiratory: Positive for cough and shortness of breath.   Cardiovascular: Positive for chest pain. Negative for palpitations and leg swelling.  Gastrointestinal: Negative for vomiting, abdominal pain and diarrhea.  Genitourinary: Negative for flank pain.  Musculoskeletal: Negative for back pain and neck pain.  Skin: Negative for rash.  Neurological: Negative for headaches.  Hematological: Does not bruise/bleed easily.  Psychiatric/Behavioral: Negative for confusion.      Allergies  Dust mite extract and Pollen extract  Home Medications   Prior to Admission medications   Medication Sig Start Date End Date Taking? Authorizing Provider  ACCU-CHEK AVIVA PLUS test strip TEST AS DIRECTED THREE TIMES DAILY 10/19/14   Brunetta Jeans, PA-C  amiodarone (PACERONE) 200 MG tablet Take 1 tablet (200 mg total) by mouth 2 (two) times daily. 07/17/15   Dorothy Spark, MD   amoxicillin-clavulanate (AUGMENTIN) 875-125 MG tablet Take 1 tablet by mouth 2 (two) times daily. 07/26/15   Colon Branch, MD  apixaban (ELIQUIS) 5 MG TABS tablet Take 1 tablet (5 mg total) by mouth 2 (two) times daily. 07/17/15   Dorothy Spark, MD  atorvastatin (LIPITOR) 80 MG tablet Take 1 tablet (80 mg total) by mouth daily. 08/31/14   Elayne Snare, MD  azelastine (ASTELIN) 0.1 % nasal spray Place 2 sprays into both nostrils at bedtime as needed for rhinitis. Use in each nostril as directed 07/26/15   Colon Branch, MD  buPROPion (WELLBUTRIN XL) 300 MG 24 hr tablet TAKE 1 TABLET(300 MG) BY MOUTH DAILY 07/23/15   Brunetta Jeans, PA-C  diltiazem (CARDIZEM CD) 120 MG 24 hr capsule Take 1 capsule (120 mg total) by mouth daily. 03/22/15   Dorothy Spark, MD  doxercalciferol (HECTOROL) 2.5 MCG capsule TABLET 1 CAPSULE BY MOUTH DAILY 03/16/15   Brunetta Jeans, PA-C  DULoxetine (CYMBALTA) 60 MG capsule Take 1 capsule (60 mg total) by mouth daily. 08/14/14   Brunetta Jeans, PA-C  ergocalciferol (VITAMIN D2) 50000 units capsule Take 1 capsule (50,000 Units total) by mouth every 30 (thirty) days. 07/02/15   Brunetta Jeans, PA-C  fluticasone (FLONASE) 50 MCG/ACT nasal spray Place 2 sprays into both nostrils daily. 07/12/15   Percell Miller Saguier, PA-C  Fluticasone Furoate-Vilanterol (BREO ELLIPTA IN) Inhale 1 puff into the lungs.    Historical Provider, MD  furosemide (LASIX) 40 MG tablet Take 40-80 mg by mouth daily.    Historical Provider, MD  gabapentin (NEURONTIN) 300 MG capsule TAKE 2 CAPSULES BY MOUTH THREE TIMES DAILY 10/06/14   Brunetta Jeans, PA-C  Insulin Glargine (TOUJEO SOLOSTAR) 300 UNIT/ML SOPN Inject 60 Units into the skin every morning. 03/14/15   Elayne Snare, MD  insulin lispro (HUMALOG KWIKPEN) 100 UNIT/ML KiwkPen Inject 20 units three times a day. 05/04/15   Elayne Snare, MD  Insulin Syringe-Needle U-100 [facility] INSULIN SYRINGE) 31G X 5/16" 0.3 ML MISC Use as directed 12/14/14   Elayne Snare, MD   ipratropium-albuterol (DUONEB) 0.5-2.5 (3) MG/3ML SOLN Take 3 mLs by nebulization every 6 (six) hours as needed. 05/29/15   Brunetta Jeans, PA-C  ketoconazole (NIZORAL) 2 % shampoo Apply 1 application topically 2 (two) times a week. 01/02/15   Brunetta Jeans, PA-C  levothyroxine (SYNTHROID, LEVOTHROID) 112 MCG tablet Take 112 mcg by mouth daily before breakfast.    Historical Provider, MD  metoCLOPramide (REGLAN) 5 MG tablet Take 1 tablet (5 mg total) by mouth 2 (two) times daily before lunch and supper. 05/16/15   Mauri Pole, MD  montelukast (SINGULAIR) 10 MG tablet Take 10 mg by mouth at bedtime.    Historical Provider, MD  nebivolol (BYSTOLIC)  10 MG tablet Take 10 mg by mouth daily.    Historical Provider, MD  nystatin (MYCOSTATIN) 100000 UNIT/ML suspension Take 5 mLs (500,000 Units total) by mouth 4 (four) times daily. 05/18/15   Mauri Pole, MD  olopatadine (PATANOL) 0.1 % ophthalmic solution Place 1 drop into both eyes 2 (two) times daily as needed. 05/29/15   Brunetta Jeans, PA-C  omeprazole (PRILOSEC) 20 MG capsule Take 1 capsule (20 mg total) by mouth daily. 05/07/15   Mauri Pole, MD  ondansetron (ZOFRAN) 4 MG tablet Take 1 tablet (4 mg total) by mouth every 8 (eight) hours as needed for nausea or vomiting. 05/07/15   Mauri Pole, MD  OXYGEN Inhale into the lungs. 2L all day and night    Historical Provider, MD  ranolazine (RANEXA) 500 MG 12 hr tablet Take 1 tablet (500 mg total) by mouth 2 (two) times daily. 07/17/15   Dorothy Spark, MD  roflumilast (DALIRESP) 500 MCG TABS tablet Take 500 mcg by mouth daily.    Historical Provider, MD  spironolactone (ALDACTONE) 25 MG tablet Take 25 mg by mouth daily.    Historical Provider, MD  Testosterone (ANDROGEL) 20.25 MG/1.25GM (1.62%) GEL Apply 3 pumps on each arm daily (6) pumps total 03/08/15   Elayne Snare, MD  tiotropium (SPIRIVA) 18 MCG inhalation capsule Place 18 mcg into inhaler and inhale daily.    Historical  Provider, MD   BP 111/59 mmHg  Pulse 103  Temp(Src) 97.7 F (36.5 C)  Resp 18  Ht 5\' 11"  (1.803 m)  Wt 92.534 kg  BMI 28.46 kg/m2  SpO2 96% Physical Exam  Constitutional: He is oriented to person, place, and time. He appears well-developed and well-nourished. No distress.  HENT:  Head: Atraumatic.  Nose: Nose normal.  Mouth/Throat: Oropharynx is clear and moist.  eacs clear. tms normal.   Eyes: Pupils are equal, round, and reactive to light.  Neck: Normal range of motion. Neck supple. No tracheal deviation present.  No stiffness or rigidity  Cardiovascular: Normal rate, normal heart sounds and intact distal pulses.  Exam reveals no gallop and no friction rub.   No murmur heard. Pulmonary/Chest: Effort normal and breath sounds normal. No accessory muscle usage. No respiratory distress. He exhibits tenderness.  Abdominal: Soft. Bowel sounds are normal. He exhibits no distension. There is no tenderness.  Genitourinary:  No cva tenderness  Musculoskeletal: Normal range of motion. He exhibits no edema or tenderness.  Lymphadenopathy:    He has no cervical adenopathy.  Neurological: He is alert and oriented to person, place, and time.  Skin: Skin is warm and dry. No rash noted. He is not diaphoretic.  Psychiatric: He has a normal mood and affect.  Nursing note and vitals reviewed.   ED Course  Procedures (including critical care time) Labs Review  Results for orders placed or performed during the hospital encounter of 08/01/15  CBC  Result Value Ref Range   WBC 21.4 (H) 4.0 - 10.5 K/uL   RBC 3.42 (L) 4.22 - 5.81 MIL/uL   Hemoglobin 10.2 (L) 13.0 - 17.0 g/dL   HCT 30.7 (L) 39.0 - 52.0 %   MCV 89.8 78.0 - 100.0 fL   MCH 29.8 26.0 - 34.0 pg   MCHC 33.2 30.0 - 36.0 g/dL   RDW 13.2 11.5 - 15.5 %   Platelets 224 150 - 400 K/uL  Troponin I  Result Value Ref Range   Troponin I 0.06 (H) <0.031 ng/mL  Comprehensive metabolic  panel  Result Value Ref Range   Sodium 128 (L) 135  - 145 mmol/L   Potassium 4.7 3.5 - 5.1 mmol/L   Chloride 102 101 - 111 mmol/L   CO2 18 (L) 22 - 32 mmol/L   Glucose, Bld 504 (H) 65 - 99 mg/dL   BUN 52 (H) 6 - 20 mg/dL   Creatinine, Ser 2.88 (H) 0.61 - 1.24 mg/dL   Calcium 8.0 (L) 8.9 - 10.3 mg/dL   Total Protein 6.1 (L) 6.5 - 8.1 g/dL   Albumin 2.7 (L) 3.5 - 5.0 g/dL   AST 12 (L) 15 - 41 U/L   ALT 9 (L) 17 - 63 U/L   Alkaline Phosphatase 109 38 - 126 U/L   Total Bilirubin 0.8 0.3 - 1.2 mg/dL   GFR calc non Af Amer 20 (L) >60 mL/min   GFR calc Af Amer 23 (L) >60 mL/min   Anion gap 8 5 - 15  Brain natriuretic peptide  Result Value Ref Range   B Natriuretic Peptide 483.1 (H) 0.0 - 100.0 pg/mL  I-Stat CG4 Lactic Acid, ED  Result Value Ref Range   Lactic Acid, Venous 1.03 0.5 - 2.0 mmol/L   Dg Chest 2 View  08/01/2015  CLINICAL DATA:  Chest pains and cough. Sore throat. History of esophageal cancer. EXAM: CHEST  2 VIEW COMPARISON:  07/26/2015 FINDINGS: Lungs are clear bilaterally. Retrocardiac density is compatible with history of esophageal cancer and previous gastric pull-through procedure. There is a nodular density in the right lower chest which was present on the exam from 09/21/2014 and may be related to a nipple shadow. Atherosclerotic calcifications in the thoracic aorta. IMPRESSION: No acute chest findings. Electronically Signed   By: Markus Daft M.D.   On: 08/01/2015 16:04   Dg Chest 2 View  07/26/2015  CLINICAL DATA:  Cough, fever, and chest congestion for the past week ; history of COPD, former smoker, history of pneumonia several months ago. EXAM: CHEST  2 VIEW COMPARISON:  PA and lateral chest x-ray of October 25, 2014 FINDINGS: The lungs remain mildly hyperinflated with hemidiaphragm flattening. The lung markings are coarse in the right lower lobe posteriorly and are more conspicuous than on the previous study. The left lung is clear. The heart and pulmonary vascularity are normal. The mediastinum is normal in width. There is no  pleural effusion. There is mild multilevel degenerative disc disease of the thoracic spine. IMPRESSION: COPD. Posterior left lower lobe infiltrate. There is no pleural effusion or evidence of CHF. Followup PA and lateral chest X-ray is recommended in 3-4 weeks following trial of antibiotic therapy to ensure resolution and exclude underlying malignancy. Electronically Signed   By: David  Martinique M.D.   On: 07/26/2015 12:23      I have personally reviewed and evaluated these images and lab results as part of my medical decision-making.   EKG Interpretation   Date/Time:  Wednesday August 01 2015 15:43:29 EDT Ventricular Rate:  98 PR Interval:  229 QRS Duration: 102 QT Interval:  362 QTC Calculation: 462 R Axis:   75 Text Interpretation:  Sinus tachycardia Paired ventricular premature  complexes Prolonged PR interval No previous ECGs available Confirmed by  NGUYEN, EMILY ([id number]) on 08/01/2015 3:46:07 PM      MDM   Labs. Cxr.  Reviewed nursing notes and prior charts for additional history.   Pt requests pain med. Ultram po.  From labs, glucose very high, 500's, hco3 borderline low (?impact recent tx course of prednisone).  Iv ns bolus.  novolog sq.  Trop sl high. ecg without acute st changes.   Given recent atypical chest pain, sl elevated trop (?due to elev cr/CRI vs recent cp or chf), glucose in 500's will admit.  Wbc elev (pt noted recently on pred), no fevers, no pna on cxr.  Lactate normal.     Lajean Saver, MD 08/01/15 1714

## 2015-08-01 NOTE — Progress Notes (Signed)
Patient presents to clinic today for follow-up of pneumonia. Patient was seen last week where CXR confirmed CAP. Was started on Augmentin, Prednisone and supportive measures with instruction to follow-up in office with PCP (this provider). Patient endorses taking antibiotic and steroids as directed. Endorses breathing improved slightly but has been dealing with constant mid-chest pain, acutely worsening with breathing. Endorses severe fatigue and SOB with minimal exertion. Endorses feeling exceptionally tired and lightheaded on standing. Has been hydrating but has not eaten in a couple of days. Endorses continued fevers up to 101 if he does not keep taking Tylenol. Patient with known history of CHF, COPD, CKD, Atrial Fibrillation and DM.   Past Medical History  Diagnosis Date  . COPD (chronic obstructive pulmonary disease) ([place])   . CHF (congestive heart failure) ([place])   . Thyroid disease   . Chronic bronchitis ([place])   . Atrial fibrillation ([place])   . CKD (chronic kidney disease)   . Diabetes mellitus without complication ([place])   . Allergy   . Esophageal cancer ([facility])     esophogeal   . CKD (chronic kidney disease)   . Depression   . Hypertension   . Colon polyps   . CAD (coronary artery disease)   . Hyperlipidemia   . GERD (gastroesophageal reflux disease)   . History of renal stone   . Stroke [facility])     ? TIA per pt  . Shortness of breath dyspnea     with exertion  . Pneumonia   . Dyslexia   . Enlarged prostate   . Headache     hx of - none in years  . Anemia   . Loose stools   . Flatulence/gas pain/belching     Current Outpatient Prescriptions on File Prior to Visit  Medication Sig Dispense Refill  . ACCU-CHEK AVIVA PLUS test strip TEST AS DIRECTED THREE TIMES DAILY 100 each 5  . amiodarone (PACERONE) 200 MG tablet Take 1 tablet (200 mg total) by mouth 2 (two) times daily. 60 tablet 11  . amoxicillin-clavulanate (AUGMENTIN) 875-125 MG tablet Take 1 tablet by mouth 2 (two)  times daily. 14 tablet 0  . apixaban (ELIQUIS) 5 MG TABS tablet Take 1 tablet (5 mg total) by mouth 2 (two) times daily. 60 tablet 11  . atorvastatin (LIPITOR) 80 MG tablet Take 1 tablet (80 mg total) by mouth daily. 30 tablet 3  . azelastine (ASTELIN) 0.1 % nasal spray Place 2 sprays into both nostrils at bedtime as needed for rhinitis. Use in each nostril as directed 30 mL 3  . buPROPion (WELLBUTRIN XL) 300 MG 24 hr tablet TAKE 1 TABLET(300 MG) BY MOUTH DAILY 90 tablet 1  . diltiazem (CARDIZEM CD) 120 MG 24 hr capsule Take 1 capsule (120 mg total) by mouth daily. 90 capsule 3  . doxercalciferol (HECTOROL) 2.5 MCG capsule TABLET 1 CAPSULE BY MOUTH DAILY 90 capsule 0  . DULoxetine (CYMBALTA) 60 MG capsule Take 1 capsule (60 mg total) by mouth daily. 90 capsule 1  . ergocalciferol (VITAMIN D2) 50000 units capsule Take 1 capsule (50,000 Units total) by mouth every 30 (thirty) days. 3 capsule 4  . fluticasone (FLONASE) 50 MCG/ACT nasal spray Place 2 sprays into both nostrils daily. 16 g 1  . Fluticasone Furoate-Vilanterol (BREO ELLIPTA IN) Inhale 1 puff into the lungs.    . furosemide (LASIX) 40 MG tablet Take 40-80 mg by mouth daily.    Marland Kitchen gabapentin (NEURONTIN) 300 MG capsule TAKE 2 CAPSULES BY MOUTH  THREE TIMES DAILY 540 capsule 0  . Insulin Glargine (TOUJEO SOLOSTAR) 300 UNIT/ML SOPN Inject 60 Units into the skin every morning. 4.5 mL 3  . insulin lispro (HUMALOG KWIKPEN) 100 UNIT/ML KiwkPen Inject 20 units three times a day. 15 mL 3  . Insulin Syringe-Needle U-100 (LITETOUCH INSULIN SYRINGE) 31G X 5/16" 0.3 ML MISC Use as directed 90 each 1  . ipratropium-albuterol (DUONEB) 0.5-2.5 (3) MG/3ML SOLN Take 3 mLs by nebulization every 6 (six) hours as needed. 360 mL 1  . ketoconazole (NIZORAL) 2 % shampoo Apply 1 application topically 2 (two) times a week. 120 mL 0  . levothyroxine (SYNTHROID, LEVOTHROID) 112 MCG tablet Take 112 mcg by mouth daily before breakfast.    . metoCLOPramide (REGLAN) 5 MG  tablet Take 1 tablet (5 mg total) by mouth 2 (two) times daily before lunch and supper. 60 tablet 3  . montelukast (SINGULAIR) 10 MG tablet Take 10 mg by mouth at bedtime.    . nebivolol (BYSTOLIC) 10 MG tablet Take 10 mg by mouth daily.    Marland Kitchen nystatin (MYCOSTATIN) 100000 UNIT/ML suspension Take 5 mLs (500,000 Units total) by mouth 4 (four) times daily. 240 mL 0  . olopatadine (PATANOL) 0.1 % ophthalmic solution Place 1 drop into both eyes 2 (two) times daily as needed. 5 mL 2  . omeprazole (PRILOSEC) 20 MG capsule Take 1 capsule (20 mg total) by mouth daily. 90 capsule 3  . ondansetron (ZOFRAN) 4 MG tablet Take 1 tablet (4 mg total) by mouth every 8 (eight) hours as needed for nausea or vomiting. 30 tablet 1  . OXYGEN Inhale into the lungs. 2L all day and night    . ranolazine (RANEXA) 500 MG 12 hr tablet Take 1 tablet (500 mg total) by mouth 2 (two) times daily. 60 tablet 11  . roflumilast (DALIRESP) 500 MCG TABS tablet Take 500 mcg by mouth daily.    Marland Kitchen spironolactone (ALDACTONE) 25 MG tablet Take 25 mg by mouth daily.    . Testosterone (ANDROGEL) 20.25 MG/1.25GM (1.62%) GEL Apply 3 pumps on each arm daily (6) pumps total 2.5 g 3  . tiotropium (SPIRIVA) 18 MCG inhalation capsule Place 18 mcg into inhaler and inhale daily.     No current facility-administered medications on file prior to visit.    Allergies  Allergen Reactions  . Dust Mite Extract Other (See Comments)    Runny nose   . Pollen Extract Other (See Comments)    Runny nose    Family History  Problem Relation Age of Onset  . COPD Father   . Diabetes Neg Hx     Social History   Social History  . Marital Status: Single    Spouse Name: N/A  . Number of Children: N/A  . Years of Education: N/A   Social History Main Topics  . Smoking status: Former Smoker -- 1.00 packs/day for 42 years    Types: Cigarettes    Quit date: 04/07/2001  . Smokeless tobacco: Never Used  . Alcohol Use: No  . Drug Use: No  . Sexual  Activity: Not Asked   Other Topics Concern  . None   Social History Narrative    Review of Systems - See HPI.  All other ROS are negative.  BP 90/50 mmHg  Pulse 111  Temp(Src) 97.7 F (36.5 C) (Oral)  Ht 5\' 11"  (1.803 m)  Wt 205 lb 6.4 oz (93.169 kg)  BMI 28.66 kg/m2  SpO2 96%  Physical Exam  Constitutional:  He is oriented to person, place, and time. He appears distressed.  HENT:  Head: Normocephalic and atraumatic.  Right Ear: Tympanic membrane normal.  Left Ear: Tympanic membrane normal.  Nose: Nose normal.  Mouth/Throat: Uvula is midline and oropharynx is clear and moist. Mucous membranes are pale and dry.  Eyes: Conjunctivae are normal.  Neck: Neck supple.  Cardiovascular: Regular rhythm, normal heart sounds and intact distal pulses.   Pulmonary/Chest: Effort normal. No respiratory distress. He has wheezes. He has no rales. He exhibits no tenderness.  Neurological: He is alert and oriented to person, place, and time.  Skin: Skin is warm and dry. No rash noted. He is not diaphoretic.  Psychiatric: Affect normal.  Vitals reviewed.   Recent Results (from the past 2160 hour(s))  Glucose, capillary     Status: Abnormal   Collection Time: 05/16/15 10:53 AM  Result Value Ref Range   Glucose-Capillary 123 (H) 65 - 99 mg/dL  POCT HgB A1C     Status: Abnormal   Collection Time: 06/04/15  1:11 PM  Result Value Ref Range   Hemoglobin A1C 7.0   Basic metabolic panel     Status: Abnormal   Collection Time: 06/04/15  1:26 PM  Result Value Ref Range   Sodium 140 135 - 145 mEq/L   Potassium 4.7 3.5 - 5.1 mEq/L   Chloride 108 96 - 112 mEq/L   CO2 23 19 - 32 mEq/L   Glucose, Bld 168 (H) 70 - 99 mg/dL   BUN 38 (H) 6 - 23 mg/dL   Creatinine, Ser 2.85 (H) 0.40 - 1.50 mg/dL   Calcium 9.3 8.4 - 10.5 mg/dL   GFR 22.98 (L) >60.00 mL/min  Lipid panel     Status: Abnormal   Collection Time: 06/04/15  1:26 PM  Result Value Ref Range   Cholesterol 165 0 - 200 mg/dL    Comment: ATP  III Classification       Desirable:  < 200 mg/dL               Borderline High:  200 - 239 mg/dL          High:  > = 240 mg/dL   Triglycerides 266.0 (H) 0.0 - 149.0 mg/dL    Comment: Normal:  <150 mg/dLBorderline High:  150 - 199 mg/dL   HDL 34.10 (L) >39.00 mg/dL   VLDL 53.2 (H) 0.0 - 40.0 mg/dL   Total CHOL/HDL Ratio 5     Comment:                Men          Women1/2 Average Risk     3.4          3.3Average Risk          5.0          4.42X Average Risk          9.6          7.13X Average Risk          15.0          11.0                       NonHDL 131.29     Comment: NOTE:  Non-HDL goal should be 30 mg/dL higher than patient's LDL goal (i.e. LDL goal of < 70 mg/dL, would have non-HDL goal of < 100 mg/dL)  Testosterone     Status: Abnormal  Collection Time: 06/04/15  1:26 PM  Result Value Ref Range   Testosterone 156.31 (L) 300.00 - 890.00 ng/dL  TSH     Status: None   Collection Time: 06/04/15  1:26 PM  Result Value Ref Range   TSH 1.24 0.35 - 4.50 uIU/mL  LDL cholesterol, direct     Status: None   Collection Time: 06/04/15  1:26 PM  Result Value Ref Range   Direct LDL 97.0 mg/dL    Comment: Optimal:  <100 mg/dLNear or Above Optimal:  100-129 mg/dLBorderline High:  130-159 mg/dLHigh:  160-189 mg/dLVery High:  >190 mg/dL  CBC     Status: Abnormal   Collection Time: 06/20/15 12:33 PM  Result Value Ref Range   WBC 11.2 (H) 4.0 - 10.5 K/uL   RBC 3.63 (L) 4.22 - 5.81 Mil/uL   Platelets 466.0 (H) 150.0 - 400.0 K/uL   Hemoglobin 11.0 (L) 13.0 - 17.0 g/dL   HCT 33.1 (L) 39.0 - 52.0 %   MCV 91.3 78.0 - 100.0 fl   MCHC 33.2 30.0 - 36.0 g/dL   RDW 14.4 11.5 - 15.5 %  Comprehensive metabolic panel     Status: Abnormal   Collection Time: 06/20/15 12:33 PM  Result Value Ref Range   Sodium 139 135 - 145 mEq/L   Potassium 4.9 3.5 - 5.1 mEq/L   Chloride 111 96 - 112 mEq/L   CO2 20 19 - 32 mEq/L   Glucose, Bld 175 (H) 70 - 99 mg/dL   BUN 41 (H) 6 - 23 mg/dL   Creatinine, Ser 2.97 (H)  0.40 - 1.50 mg/dL   Total Bilirubin 0.5 0.2 - 1.2 mg/dL   Alkaline Phosphatase 105 39 - 117 U/L   AST 15 0 - 37 U/L   ALT 14 0 - 53 U/L   Total Protein 6.3 6.0 - 8.3 g/dL   Albumin 3.3 (L) 3.5 - 5.2 g/dL   Calcium 8.8 8.4 - 10.5 mg/dL   GFR 21.91 (L) >60.00 mL/min  Urine culture     Status: None   Collection Time: 06/21/15  2:20 PM  Result Value Ref Range   Colony Count NO GROWTH    Organism ID, Bacteria NO GROWTH   CBC w/Diff     Status: Abnormal   Collection Time: 07/02/15  1:45 PM  Result Value Ref Range   WBC 10.1 4.0 - 10.5 K/uL   RBC 3.86 (L) 4.22 - 5.81 Mil/uL   Hemoglobin 11.5 (L) 13.0 - 17.0 g/dL   HCT 34.8 (L) 39.0 - 52.0 %   MCV 90.2 78.0 - 100.0 fl   MCHC 33.0 30.0 - 36.0 g/dL   RDW 13.5 11.5 - 15.5 %   Platelets 333.0 150.0 - 400.0 K/uL   Neutrophils Relative % 76.6 43.0 - 77.0 %   Lymphocytes Relative 14.7 12.0 - 46.0 %   Monocytes Relative 6.4 3.0 - 12.0 %   Eosinophils Relative 1.9 0.0 - 5.0 %   Basophils Relative 0.4 0.0 - 3.0 %   Neutro Abs 7.8 (H) 1.4 - 7.7 K/uL   Lymphs Abs 1.5 0.7 - 4.0 K/uL   Monocytes Absolute 0.6 0.1 - 1.0 K/uL   Eosinophils Absolute 0.2 0.0 - 0.7 K/uL   Basophils Absolute 0.0 0.0 - 0.1 K/uL  Basic Metabolic Panel (BMET)     Status: Abnormal   Collection Time: 07/02/15  1:45 PM  Result Value Ref Range   Sodium 139 135 - 145 mEq/L   Potassium 5.0 3.5 - 5.1  mEq/L   Chloride 107 96 - 112 mEq/L   CO2 21 19 - 32 mEq/L   Glucose, Bld 332 (H) 70 - 99 mg/dL   BUN 41 (H) 6 - 23 mg/dL   Creatinine, Ser 2.81 (H) 0.40 - 1.50 mg/dL   Calcium 8.9 8.4 - 10.5 mg/dL   GFR 23.35 (L) >60.00 mL/min  HM DIABETES EYE EXAM     Status: None   Collection Time: 07/10/15 12:00 AM  Result Value Ref Range   HM Diabetic Eye Exam No Retinopathy No Retinopathy  POC Influenza A&B (Binax test)     Status: None   Collection Time: 07/12/15  5:38 PM  Result Value Ref Range   Influenza A, POC Negative Negative   Influenza B, POC Negative Negative  CBC w/Diff      Status: Abnormal   Collection Time: 07/26/15 10:05 AM  Result Value Ref Range   WBC 12.7 (H) 4.0 - 10.5 K/uL   RBC 3.33 (L) 4.22 - 5.81 Mil/uL   Hemoglobin 9.9 (L) 13.0 - 17.0 g/dL   HCT 29.9 (L) 39.0 - 52.0 %   MCV 89.7 78.0 - 100.0 fl   MCHC 33.2 30.0 - 36.0 g/dL   RDW 13.9 11.5 - 15.5 %   Platelets 300.0 150.0 - 400.0 K/uL   Neutrophils Relative % 86.0 (H) 43.0 - 77.0 %    Comment: A Manual Differential was performed and is consistent with the Automated Differential. 1+ Polychromasia; Basophilic Stipling present   Lymphocytes Relative 7.2 (L) 12.0 - 46.0 %   Monocytes Relative 6.3 3.0 - 12.0 %   Eosinophils Relative 0.4 0.0 - 5.0 %   Basophils Relative 0.1 0.0 - 3.0 %   Neutro Abs 10.9 (H) 1.4 - 7.7 K/uL   Lymphs Abs 0.9 0.7 - 4.0 K/uL   Monocytes Absolute 0.8 0.1 - 1.0 K/uL   Eosinophils Absolute 0.1 0.0 - 0.7 K/uL   Basophils Absolute 0.0 0.0 - 0.1 K/uL    Assessment/Plan: 1. Pleuritic chest pain With CAP. Lung exam without rales but mild wheeze. BP is hypotensive today. Patient tachycardic. Is not feeling well despite treatment. Multiple comorbid conditions. EKG obtained revealing sinus tachycardia. Patient triaged to ER downstairs for acute assessment to re-evaluate pneumonia and assess for ACS or PE. Patient will need admission giving comorbid conditions.  - EKG 12-Lead

## 2015-08-01 NOTE — H&P (Signed)
History and Physical    BERGE BOURNES [id number] DOB: 1936/01/17 DOA: 08/01/2015  Referring Provider: EDP PCP: Leeanne Rio, PA-C  Outpatient Specialists: Dr. Dwyane Dee (endocrinology), Dr. Silverio Decamp (GI), Dr. Meda Coffee (cardiology), Dr. Elsworth Soho (pulmonology)   Patient coming from: Home  Chief Complaint: Chest pain   HPI: DEMITRIOS Baker is a 80 y.o. male with medical history significant for paroxysmal atrial fibrillation on Eliquis, chronic diastolic CHF, COPD with home O2 requirement, chronic kidney disease stage III,  Hypothyroidism, depression, and esophageal cancer status post resection in 2006 and presents to the ED at [facility] with chest pain of more than one week's duration.patient had been in his usual state of health until approximately 2 weeks ago when he developed some dyspnea on exertion, cough, fevers, and malaise. He was evaluated by his PCP on 07/26/2015, diagnosed with pneumonia based on chest x-ray, and treated with a course of Augmentin and prednisone. Fevers resolved and dyspnea improved; chest pain has persisted, however. Pain is described as constant, sharp,localized to the central chest, worse with cough, and alleviated somewhat with narcotic analgesic received en route. He has never experienced similar symptoms previously. He denies fevers over the past few days. He denies unilateral leg swelling or tenderness, and denies lifting distance travel.   ED Course: Upon arrival to the ED, patient was found to be afebrile, saturating well on 2 L/m, and with vital signs stable. Chest x-ray demonstrates resolution of the prior infiltrate and EKG demonstrates a sinus rhythm with first-degree AV block and no significant change from prior. CMP is notable for a serum creatinine of 2.88 which appears stable relative to prior measurement. Hyponatremia to 128 and hyperglycemia to 504 is also noted. CBC features a leukocytosis to 21,400 and hemoglobin of 10.2. Hemoglobin appears  stable relative to the prior measurement. Troponin returned elevated to a value of 0.06 and BNP is also elevated to 483. Tramadol was administered for pain, 12 units of subcutaneous NovoLog were given, and the patient was bolused with 1.5 L normal saline. He remained hemodynamically stable in the emergency department and transferred to [facility] for admission was requested and accepted.  The patient is evaluated on the telemetry unit at [facility] where he was found to be afebrile, saturating well on 2 L/m via nasal cannula, and with vitals otherwise stable. He will be admitted for ongoing evaluation and management of chest pain concerning for possible ACS.  Review of Systems:  All other systems reviewed and apart from HPI, are negative.  Past Medical History  Diagnosis Date  . COPD (chronic obstructive pulmonary disease) ([place])   . CHF (congestive heart failure) ([place])   . Thyroid disease   . Chronic bronchitis ([place])   . Atrial fibrillation ([place])   . CKD (chronic kidney disease)   . Diabetes mellitus without complication ([place])   . Allergy   . Esophageal cancer ([facility])     esophogeal   . CKD (chronic kidney disease)   . Depression   . Hypertension   . Colon polyps   . CAD (coronary artery disease)   . Hyperlipidemia   . GERD (gastroesophageal reflux disease)   . History of renal stone   . Stroke [facility])     ? TIA per pt  . Shortness of breath dyspnea     with exertion  . Pneumonia   . Dyslexia   . Enlarged prostate   . Headache     hx of - none in years  .  Anemia   . Loose stools   . Flatulence/gas pain/belching     Past Surgical History  Procedure Laterality Date  . Esophagus surgery  2006  . Spine surgery      bone spurs  . Cardiac catheterization  2009    two stents placed  . Tonsillectomy    . Eye surgery Bilateral     cataract surgery with lens implants  . Colonoscopy    . Esophagogastroduodenoscopy (egd) with propofol N/A 05/16/2015    Procedure:  ESOPHAGOGASTRODUODENOSCOPY (EGD) WITH PROPOFOL;  Surgeon: Mauri Pole, MD;  Location: [facility];  Service: Endoscopy;  Laterality: N/A;     reports that he quit smoking about 14 years ago. His smoking use included Cigarettes. He has a 42 pack-year smoking history. He has never used smokeless tobacco. He reports that he does not drink alcohol or use illicit drugs.  Allergies  Allergen Reactions  . Dust Mite Extract Other (See Comments)    Runny nose   . Pollen Extract Other (See Comments)    Runny nose    Family History  Problem Relation Age of Onset  . COPD Father   . Diabetes Neg Hx      Prior to Admission medications   Medication Sig Start Date End Date Taking? Authorizing Provider  ACCU-CHEK AVIVA PLUS test strip TEST AS DIRECTED THREE TIMES DAILY 10/19/14   Brunetta Jeans, PA-C  amiodarone (PACERONE) 200 MG tablet Take 1 tablet (200 mg total) by mouth 2 (two) times daily. 07/17/15   Dorothy Spark, MD  amoxicillin-clavulanate (AUGMENTIN) 875-125 MG tablet Take 1 tablet by mouth 2 (two) times daily. 07/26/15   Colon Branch, MD  apixaban (ELIQUIS) 5 MG TABS tablet Take 1 tablet (5 mg total) by mouth 2 (two) times daily. 07/17/15   Dorothy Spark, MD  atorvastatin (LIPITOR) 80 MG tablet Take 1 tablet (80 mg total) by mouth daily. 08/31/14   Elayne Snare, MD  azelastine (ASTELIN) 0.1 % nasal spray Place 2 sprays into both nostrils at bedtime as needed for rhinitis. Use in each nostril as directed 07/26/15   Colon Branch, MD  buPROPion (WELLBUTRIN XL) 300 MG 24 hr tablet TAKE 1 TABLET(300 MG) BY MOUTH DAILY 07/23/15   Brunetta Jeans, PA-C  diltiazem (CARDIZEM CD) 120 MG 24 hr capsule Take 1 capsule (120 mg total) by mouth daily. 03/22/15   Dorothy Spark, MD  doxercalciferol (HECTOROL) 2.5 MCG capsule TABLET 1 CAPSULE BY MOUTH DAILY 03/16/15   Brunetta Jeans, PA-C  DULoxetine (CYMBALTA) 60 MG capsule Take 1 capsule (60 mg total) by mouth daily. 08/14/14   Brunetta Jeans,  PA-C  ergocalciferol (VITAMIN D2) 50000 units capsule Take 1 capsule (50,000 Units total) by mouth every 30 (thirty) days. 07/02/15   Brunetta Jeans, PA-C  fluticasone (FLONASE) 50 MCG/ACT nasal spray Place 2 sprays into both nostrils daily. 07/12/15   Percell Miller Saguier, PA-C  Fluticasone Furoate-Vilanterol (BREO ELLIPTA IN) Inhale 1 puff into the lungs.    Historical Provider, MD  furosemide (LASIX) 40 MG tablet Take 40-80 mg by mouth daily.    Historical Provider, MD  gabapentin (NEURONTIN) 300 MG capsule TAKE 2 CAPSULES BY MOUTH THREE TIMES DAILY 10/06/14   Brunetta Jeans, PA-C  Insulin Glargine (TOUJEO SOLOSTAR) 300 UNIT/ML SOPN Inject 60 Units into the skin every morning. 03/14/15   Elayne Snare, MD  insulin lispro (HUMALOG KWIKPEN) 100 UNIT/ML KiwkPen Inject 20 units three times a day. 05/04/15  Elayne Snare, MD  Insulin Syringe-Needle U-100 [facility] INSULIN SYRINGE) 31G X 5/16" 0.3 ML MISC Use as directed 12/14/14   Elayne Snare, MD  ipratropium-albuterol (DUONEB) 0.5-2.5 (3) MG/3ML SOLN Take 3 mLs by nebulization every 6 (six) hours as needed. 05/29/15   Brunetta Jeans, PA-C  ketoconazole (NIZORAL) 2 % shampoo Apply 1 application topically 2 (two) times a week. 01/02/15   Brunetta Jeans, PA-C  levothyroxine (SYNTHROID, LEVOTHROID) 112 MCG tablet Take 112 mcg by mouth daily before breakfast.    Historical Provider, MD  metoCLOPramide (REGLAN) 5 MG tablet Take 1 tablet (5 mg total) by mouth 2 (two) times daily before lunch and supper. 05/16/15   Mauri Pole, MD  montelukast (SINGULAIR) 10 MG tablet Take 10 mg by mouth at bedtime.    Historical Provider, MD  nebivolol (BYSTOLIC) 10 MG tablet Take 10 mg by mouth daily.    Historical Provider, MD  nystatin (MYCOSTATIN) 100000 UNIT/ML suspension Take 5 mLs (500,000 Units total) by mouth 4 (four) times daily. 05/18/15   Mauri Pole, MD  olopatadine (PATANOL) 0.1 % ophthalmic solution Place 1 drop into both eyes 2 (two) times daily as needed.  05/29/15   Brunetta Jeans, PA-C  omeprazole (PRILOSEC) 20 MG capsule Take 1 capsule (20 mg total) by mouth daily. 05/07/15   Mauri Pole, MD  ondansetron (ZOFRAN) 4 MG tablet Take 1 tablet (4 mg total) by mouth every 8 (eight) hours as needed for nausea or vomiting. 05/07/15   Mauri Pole, MD  OXYGEN Inhale into the lungs. 2L all day and night    Historical Provider, MD  ranolazine (RANEXA) 500 MG 12 hr tablet Take 1 tablet (500 mg total) by mouth 2 (two) times daily. 07/17/15   Dorothy Spark, MD  roflumilast (DALIRESP) 500 MCG TABS tablet Take 500 mcg by mouth daily.    Historical Provider, MD  spironolactone (ALDACTONE) 25 MG tablet Take 25 mg by mouth daily.    Historical Provider, MD  Testosterone (ANDROGEL) 20.25 MG/1.25GM (1.62%) GEL Apply 3 pumps on each arm daily (6) pumps total 03/08/15   Elayne Snare, MD  tiotropium (SPIRIVA) 18 MCG inhalation capsule Place 18 mcg into inhaler and inhale daily.    Historical Provider, MD    Physical Exam: Filed Vitals:   08/01/15 1730 08/01/15 1800 08/01/15 1830 08/01/15 2020  BP: 127/69 125/68 129/60 140/55  Pulse: 91 91 93 99  Temp:    98.3 F (36.8 C)  TempSrc:    Oral  Resp: 21 24 20 20   Height:    5\' 11"  (1.803 m)  Weight:    91.536 kg (201 lb 12.8 oz)  SpO2: 95% 95% 96% 100%      Constitutional: NAD, calm, comfortable Eyes: PERTLA, lids and conjunctivae normal ENMT: Mucous membranes are moist. Posterior pharynx clear of any exudate or lesions.   Neck: normal, supple, no masses, no thyromegaly Respiratory: clear to auscultation bilaterally, no wheezing, no crackles. Normal respiratory effort. No accessory muscle use.  Cardiovascular: S1 & S2 heard, regular rate and rhythm, soft systolic murmur at LSB, no rubs / gallops.  No carotid bruits.  Abdomen: No distension, no tenderness, no masses palpated. Bowel sounds normal.  Musculoskeletal: no clubbing / cyanosis. No joint deformity upper and lower extremities. Good ROM, no  contractures. Normal muscle tone.  Skin: no rashes, lesions, ulcers. No induration Neurologic: CN 2-12 grossly intact. Sensation intact, DTR normal. Strength 5/5 in all 4 limbs.  Psychiatric: Normal  judgment and insight. Alert and oriented x 3. Normal mood.     Labs on Admission: I have personally reviewed following labs and imaging studies  CBC:  Recent Labs Lab 07/26/15 1005 08/01/15 1620  WBC 12.7* 21.4*  NEUTROABS 10.9*  --   HGB 9.9* 10.2*  HCT 29.9* 30.7*  MCV 89.7 89.8  PLT 300.0 [id number]   Basic Metabolic Panel:  Recent Labs Lab 08/01/15 1620  NA 128*  K 4.7  CL 102  CO2 18*  GLUCOSE 504*  BUN 52*  CREATININE 2.88*  CALCIUM 8.0*   GFR: Estimated Creatinine Clearance: 24.9 mL/min (by C-G formula based on Cr of 2.88). Liver Function Tests:  Recent Labs Lab 08/01/15 1620  AST 12*  ALT 9*  ALKPHOS 109  BILITOT 0.8  PROT 6.1*  ALBUMIN 2.7*   No results for input(s): LIPASE, AMYLASE in the last 168 hours. No results for input(s): AMMONIA in the last 168 hours. Coagulation Profile: No results for input(s): INR, PROTIME in the last 168 hours. Cardiac Enzymes:  Recent Labs Lab 08/01/15 1620  TROPONINI 0.06*   BNP (last 3 results)  Recent Labs  10/30/14 1056  PROBNP 117.0*   HbA1C: No results for input(s): HGBA1C in the last 72 hours. CBG:  Recent Labs Lab 08/01/15 1815 08/01/15 2020  GLUCAP 442* 335*   Lipid Profile: No results for input(s): CHOL, HDL, LDLCALC, TRIG, CHOLHDL, LDLDIRECT in the last 72 hours. Thyroid Function Tests: No results for input(s): TSH, T4TOTAL, FREET4, T3FREE, THYROIDAB in the last 72 hours. Anemia Panel: No results for input(s): VITAMINB12, FOLATE, FERRITIN, TIBC, IRON, RETICCTPCT in the last 72 hours. Urine analysis: No results found for: COLORURINE, APPEARANCEUR, LABSPEC, PHURINE, GLUCOSEU, HGBUR, BILIRUBINUR, KETONESUR, PROTEINUR, UROBILINOGEN, NITRITE, LEUKOCYTESUR Sepsis  Labs: @LABRCNTIP (procalcitonin:4,lacticidven:4) )No results found for this or any previous visit (from the past 240 hour(s)).   Radiological Exams on Admission: Dg Chest 2 View  08/01/2015  CLINICAL DATA:  Chest pains and cough. Sore throat. History of esophageal cancer. EXAM: CHEST  2 VIEW COMPARISON:  07/26/2015 FINDINGS: Lungs are clear bilaterally. Retrocardiac density is compatible with history of esophageal cancer and previous gastric pull-through procedure. There is a nodular density in the right lower chest which was present on the exam from 09/21/2014 and may be related to a nipple shadow. Atherosclerotic calcifications in the thoracic aorta. IMPRESSION: No acute chest findings. Electronically Signed   By: Markus Daft M.D.   On: 08/01/2015 16:04    EKG: Independently reviewed. Sinus rhythm with 1st degree AV block   Assessment/Plan  1. Chest pain  - Initial troponin 0.06; EKG without acute ischemic features  - ASA 324 mg given on arrival, Lipitor given, beta-blocker held for diastolic hypotension  - Trend troponin overnight, repeat EKG in am  - Suspect this is MSK pain secondary to recent PNA and associated coughing fits    2. COPD  - Requiring supplemental O2 around-the-clock at baseline - Appears stable, saturating well on his usual 2 Lpm - CXR demonstrates resolution of recent infiltrate  - Continue DuoNeb prn, Singulair qD    3. Chronic diastolic CHF  - Appeared dry at outside hospital and was bolused 1.5 L NS  - Appears euvolemic on arrival to [facility]  - Hold Lasix and Aldactone for now, resume as appropriate  - SLIV, daily wts, strict I/Os, fluid-restrict diet  - TTE (04/04/15) with EF 0000000, grade 1 diastolic dysfunction, mild AS    4. Paroxysmal atrial fibrillation - In sinus rhythm on  admission EKG  - CHADS-VASc is 64 (age x2, CHF, DM) - Continue AC with Eliquis  - Continue rate-control with amiodarone, diltiazem   5. Type II DM  - Managed with Lantus 60 units qD  and Humalog 20 units TID at home  - A1c was 7.0% on 06/04/15, reflecting adequate glycemic control for his age-group  - Continue Lantus 60 units qD with reduced-dose mealtime coverage and addition of moderate-intensity sliding-scale correctional  - Check CBG with meals and qHS, adjust insulins prn  - Carb-modified diet when appropriate   6. Leukocytosis, anemia - Leukocytosis likely secondary to recent steroid use; completed 5-day course of prednisone on 4/25 - Hgb is 10.2 on admission and stable relative to prior measurements  - No sign of active blood loss  - Monitor    7. CKD stage III - SCr 2.88 on admission, stable relative to prior measurement  - Avoiding nephrotoxins where possible  - Monitor    8. Depression  - Appears stable  - Continue current-dose Wellbutrin     DVT prophylaxis: On Eliquis  Code Status: Full  Family Communication: None available  Disposition Plan: Admit to telemetry   Consults called: None  Admission status: Inpatient    Vianne Bulls MD [facility] Pager [phone]  If 7PM-7AM, please contact night-coverage [email or web address] Password [facility]  08/01/2015, 9:44 PM

## 2015-08-01 NOTE — ED Notes (Addendum)
Pt c/o chest pain DX pneumonia last week Sent from PMD office for further eval

## 2015-08-02 ENCOUNTER — Inpatient Hospital Stay (HOSPITAL_COMMUNITY): Payer: Medicare Other

## 2015-08-02 DIAGNOSIS — Z9861 Coronary angioplasty status: Secondary | ICD-10-CM

## 2015-08-02 DIAGNOSIS — R0781 Pleurodynia: Secondary | ICD-10-CM

## 2015-08-02 DIAGNOSIS — I2510 Atherosclerotic heart disease of native coronary artery without angina pectoris: Secondary | ICD-10-CM | POA: Diagnosis not present

## 2015-08-02 DIAGNOSIS — I350 Nonrheumatic aortic (valve) stenosis: Secondary | ICD-10-CM | POA: Diagnosis not present

## 2015-08-02 DIAGNOSIS — I10 Essential (primary) hypertension: Secondary | ICD-10-CM | POA: Diagnosis not present

## 2015-08-02 DIAGNOSIS — N189 Chronic kidney disease, unspecified: Secondary | ICD-10-CM | POA: Insufficient documentation

## 2015-08-02 DIAGNOSIS — R0789 Other chest pain: Principal | ICD-10-CM

## 2015-08-02 DIAGNOSIS — N182 Chronic kidney disease, stage 2 (mild): Secondary | ICD-10-CM

## 2015-08-02 DIAGNOSIS — I480 Paroxysmal atrial fibrillation: Secondary | ICD-10-CM

## 2015-08-02 LAB — GLUCOSE, CAPILLARY
GLUCOSE-CAPILLARY: 305 mg/dL — AB (ref 65–99)
GLUCOSE-CAPILLARY: 232 mg/dL — AB (ref 65–99)
GLUCOSE-CAPILLARY: 61 mg/dL — AB (ref 65–99)
Glucose-Capillary: 86 mg/dL (ref 65–99)
Glucose-Capillary: 94 mg/dL (ref 65–99)
Glucose-Capillary: 123 mg/dL — ABNORMAL HIGH (ref 65–99)

## 2015-08-02 LAB — CBC WITH DIFFERENTIAL/PLATELET
Basophils Absolute: 0 10*3/uL (ref 0.0–0.1)
Basophils Relative: 0 %
EOS ABS: 0.2 10*3/uL (ref 0.0–0.7)
Eosinophils Relative: 1 %
HEMATOCRIT: 28.9 % — AB (ref 39.0–52.0)
HEMOGLOBIN: 9.1 g/dL — AB (ref 13.0–17.0)
LYMPHS ABS: 1.2 10*3/uL (ref 0.7–4.0)
LYMPHS PCT: 7 %
MCH: 28.3 pg (ref 26.0–34.0)
MCHC: 31.5 g/dL (ref 30.0–36.0)
MCV: 89.8 fL (ref 78.0–100.0)
MONOS PCT: 6 %
Monocytes Absolute: 1.1 10*3/uL — ABNORMAL HIGH (ref 0.1–1.0)
NEUTROS ABS: 14.9 10*3/uL — AB (ref 1.7–7.7)
NEUTROS PCT: 86 %
Platelets: 195 10*3/uL (ref 150–400)
RBC: 3.22 MIL/uL — AB (ref 4.22–5.81)
RDW: 13.5 % (ref 11.5–15.5)
WBC: 17.4 10*3/uL — AB (ref 4.0–10.5)

## 2015-08-02 LAB — TROPONIN I
TROPONIN I: 0.09 ng/mL — AB (ref <0.031)
Troponin I: 0.06 ng/mL — ABNORMAL HIGH (ref <0.031)

## 2015-08-02 LAB — BASIC METABOLIC PANEL
Anion gap: 9 (ref 5–15)
BUN: 49 mg/dL — ABNORMAL HIGH (ref 6–20)
CHLORIDE: 107 mmol/L (ref 101–111)
CO2: 20 mmol/L — AB (ref 22–32)
CREATININE: 3.02 mg/dL — AB (ref 0.61–1.24)
Calcium: 8.1 mg/dL — ABNORMAL LOW (ref 8.9–10.3)
GFR calc non Af Amer: 19 mL/min — ABNORMAL LOW (ref >60)
GFR, EST AFRICAN AMERICAN: 21 mL/min — AB (ref >60)
Glucose, Bld: 315 mg/dL — ABNORMAL HIGH (ref 65–99)
POTASSIUM: 4.3 mmol/L (ref 3.5–5.1)
SODIUM: 136 mmol/L (ref 135–145)

## 2015-08-02 MED ORDER — DILTIAZEM HCL ER COATED BEADS 120 MG PO CP24
120.0000 mg | ORAL_CAPSULE | Freq: Every day | ORAL | Status: DC
  Administered 2015-08-02: 120 mg via ORAL
  Filled 2015-08-02: qty 1

## 2015-08-02 MED ORDER — ERGOCALCIFEROL 1.25 MG (50000 UT) PO CAPS: 50000.0000 [IU] | ORAL_CAPSULE | ORAL | Status: DC

## 2015-08-02 MED ORDER — SODIUM CHLORIDE 0.9 % IV SOLN
INTRAVENOUS | Status: DC
  Administered 2015-08-02: 19:00:00 via INTRAVENOUS

## 2015-08-02 MED ORDER — ONDANSETRON HCL 4 MG/2ML IJ SOLN: 4.0000 mg | Freq: Four times a day (QID) | INTRAMUSCULAR | Status: DC | PRN

## 2015-08-02 MED ORDER — VITAMIN D (ERGOCALCIFEROL) 1.25 MG (50000 UNIT) PO CAPS: 50000.0000 [IU] | ORAL_CAPSULE | ORAL | Status: DC

## 2015-08-02 MED ORDER — ONDANSETRON HCL 4 MG/2ML IJ SOLN
4.0000 mg | INTRAMUSCULAR | Status: DC | PRN
  Administered 2015-08-02 – 2015-08-13 (×9): 4 mg via INTRAVENOUS
  Filled 2015-08-02 (×9): qty 2

## 2015-08-02 MED ORDER — AZELASTINE HCL 0.1 % NA SOLN
2.0000 | Freq: Every evening | NASAL | Status: DC | PRN
  Filled 2015-08-02: qty 30

## 2015-08-02 NOTE — Progress Notes (Signed)
Pt B/P 90/55. MD notified. RN will continue to monitor

## 2015-08-02 NOTE — Progress Notes (Addendum)
Patient ID: Ricky Baker, male   DOB: March 25, 1936, 80 y.o.   MRN: [id number]    PROGRESS NOTE    Ricky Baker  [id number] DOB: [birth year] DOA: 08/01/2015  PCP: Leeanne Rio, PA-C   Outpatient Specialists:   Brief Narrative:  80 y.o. male with paroxysmal atrial fibrillation on Eliquis, chronic diastolic CHF, COPD with home O2 requirement, chronic kidney disease stage III, Hypothyroidism, depression, and esophageal cancer status post resection in 2006 presented to the ED at [facility] [place] [facility] with chest pain of more than one week's duration.patient, PCP on 07/26/2015, diagnosed him with pneumonia based on chest x-ray, and treated with a course of Augmentin and prednisone.   ED Course: Upon arrival to the ED, patient was found to be afebrile, saturating well on 2 L/m, and with vital signs stable. Chest x-ray demonstrates resolution of the prior infiltrate, CBC with leukocytosis 21,400 and hemoglobin of 10.2.  Assessment & Plan:  1. Chest pain  - Initial troponin 0.06; EKG without acute ischemic features  - ASA 324 mg given on arrival, Lipitor given - Suspect this is MSK pain secondary to recent PNA and associated coughing fits   - per cardiology, no need for further cardiac work up at this time - outpatient follow up with Dr. Meda Coffee   2. COPD  - Requiring supplemental O2 around-the-clock at baseline - Appears stable, saturating well on his usual 2 Lpm - CXR demonstrates resolution of recent infiltrate  - Continue DuoNeb prn, Singulair QD   3. Chronic diastolic CHF  - Appears euvolemic this AM - Hold Lasix and Aldactone for now, resume in AM - daily wts, strict I/Os, fluid-restrict diet, weight is 200 lbs this AM  - TTE (04/04/15) with EF 0000000, grade 1 diastolic dysfunction, mild AS   4. Paroxysmal atrial fibrillation - In sinus rhythm on admission EKG  - CHADS-VASc is 86 (age x2, CHF, DM) - Continue AC with Eliquis  - Continue rate-control with  amiodarone, resume home Cardizem this AM - pt already on home regimen Nebivolol   5. 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 Leukocytosis, anemia - Leukocytosis likely secondary to recent steroid use; completed 5-day course of prednisone on 4/25 - WBC trending down - Hgb is 10.2 on admission, 9.1 this AM - No sign of active blood loss  - CBC in AM  7. CKD stage III, hyponatremia  - SCr 2.88 on admission but up this AM 3.08 - Avoiding nephrotoxins where possible  - Na is WNL this AM - BMP in AM  8. Depression  - Appears stable  - Continue current-dose Wellbutrin   DVT prophylaxis: on Eliquis  Code Status: Full  Family Communication: Patient at bedside  Disposition Plan: Independent living facility by 4/28  Consultants:   None  Procedures:   None  Antimicrobials:  None  Subjective: Reports feeling better but still chest discomfort worse with deep breaths.   Objective: Filed Vitals:   08/01/15 1800 08/01/15 1830 08/01/15 2020 08/02/15 0448  BP: 125/68 129/60 140/55 133/58  Pulse: 91 93 99 89  Temp:   98.3 F (36.8 C) 97.6 F (36.4 C)  TempSrc:  Oral Oral  Resp: 24 20 20 20   Height:   5\' 11"  (1.803 m)   Weight:   91.536 kg (201 lb 12.8 oz) 91 kg (200 lb 9.9 oz)  SpO2: 95% 96% 100% 100%    Intake/Output Summary (Last 24 hours) at 08/02/15 0629 Last data filed at 08/02/15 0451  Gross per 24 hour  Intake    480 ml  Output    300 ml  Net    180 ml   Filed Weights   08/01/15 1523 08/01/15 2020 08/02/15 0448  Weight: 92.534 kg (204 lb) 91.536 kg (201 lb 12.8 oz) 91 kg (200 lb 9.9 oz)    Examination:  General exam: Appears calm and comfortable   Respiratory system: Diminished breath sounds at bases  Cardiovascular system: S1 & S2 heard, RRR. SEM 2/6, No JVD, rubs, gallops or clicks. No pedal edema. Gastrointestinal system: Abdomen is nondistended, soft and nontender.  Central nervous system: Alert and oriented. No focal neurological deficits. Extremities: Symmetric 5 x 5 power. Skin: No rashes, lesions or ulcers Psychiatry: Judgement and insight appear normal. Mood & affect appropriate.   Data Reviewed: I have personally reviewed following labs and imaging studies  CBC:  Recent Labs Lab 07/26/15 1005 08/01/15 1620 08/02/15 0331  WBC 12.7* 21.4* 17.4*  NEUTROABS 10.9*  --  14.9*  HGB 9.9* 10.2* 9.1*  HCT 29.9* 30.7* 28.9*  MCV 89.7 89.8 89.8  PLT 300.0 224 0000000   Basic Metabolic Panel:  Recent Labs Lab 08/01/15 1620 08/02/15 0331  NA 128* 136  K 4.7 4.3  CL 102 107  CO2 18* 20*  GLUCOSE 504* 315*  BUN 52* 49*  CREATININE 2.88* 3.02*  CALCIUM 8.0* 8.1*   Liver Function Tests:  Recent Labs Lab 08/01/15 1620  AST 12*  ALT 9*  ALKPHOS 109  BILITOT 0.8  PROT 6.1*  ALBUMIN 2.7*   Cardiac Enzymes:  Recent Labs Lab 08/01/15 1620 08/01/15 2154 08/02/15 0331  TROPONINI 0.06* 0.07* 0.06*   BNP (last 3 results)  Recent Labs  10/30/14 1056  PROBNP 117.0*   CBG:  Recent Labs Lab 08/01/15 1815 08/01/15 2020  GLUCAP 442* 335*    Radiology Studies: Dg Chest 2 View 08/01/2015   No acute chest findings.   Scheduled Meds: . amiodarone  200 mg Oral BID  . apixaban  5 mg Oral BID  . aspirin EC  81 mg Oral Daily  . atorvastatin  80 mg Oral Daily  . buPROPion  300 mg Oral Daily  . doxercalciferol  2.5 mcg Oral Daily  . DULoxetine  60 mg Oral Daily  . fluticasone  2 spray Each Nare Daily  . gabapentin  600 mg Oral TID  . insulin aspart  0-15 Units Subcutaneous TID WC  . insulin aspart  4 Units Subcutaneous TID WC  . insulin glargine  60 Units Subcutaneous QHS  . levothyroxine  112 mcg Oral  QAC breakfast  . montelukast  10 mg Oral QHS  . nebivolol  10 mg Oral Daily  . olopatadine  1 drop Both Eyes BID  . pantoprazole  40 mg Oral Daily  . ranolazine  500 mg Oral BID  . roflumilast  500 mcg Oral Daily  . testosterone  5 g Transdermal Daily  . tiotropium  18 mcg Inhalation Daily     LOS: 1 day   Time spent: 20 minutes   Faye Ramsay, MD [facility] Pager [phone]  If 7PM-7AM, please contact night-coverage [email or web address] Password [email or web address]  08/02/2015, 6:29 AM

## 2015-08-02 NOTE — Progress Notes (Signed)
RN asked pt about I and O cath. Pt states he will "pee when it gets ready". He said he has seen a doctor about this. RN will continue to monitor

## 2015-08-02 NOTE — Consult Note (Signed)
Cardiologist:  Ricky Baker Reason for Consult: Chest Pain Referring Physician: [name] is an 80 y.o. male.  HPI:   The patient is a 80 yo male with prior medical history of PAF-on Eliquis 5 mg orally twice a day, coronary artery disease status post placement of 2 stents in 2013, COPD on home O2 at 2 liters, congestive heart failure, hypertension, IDDM, hyperlipidemia, diabetic neuropathy in legs.  The patient was followed in [facility] by Dr. Lyndel Baker. He states that prior to his stent placement he experience worsening dyspnea on exertion however, never experienced chest pain. He noticed mild improvement of dyspnea after stent stent placement as he is on home O2 oxygen and has dyspnea almost all the time. He has history of esophageal cancer status post partial esophagectomy- 2006, but no radiation.   The patient presents with chest pain.  He reports being diagnosed with pneumonia last week and was started on Augmentin.  He's been having centralized CP which at its worst was 10/10.  It was so bad with breathing, he was shallow breathing on purpose.  He also reports a sore throat, nausea and retching without vomiting.     Past Medical History  Diagnosis Date  . COPD (chronic obstructive pulmonary disease) ([place])   . CHF (congestive heart failure) ([place])   . Thyroid disease   . Chronic bronchitis ([place])   . Atrial fibrillation ([place])   . CKD (chronic kidney disease)   . Diabetes mellitus without complication ([place])   . Allergy   . Esophageal cancer (HCC)     esophogeal   . CKD (chronic kidney disease)   . Depression   . Hypertension   . Colon polyps   . CAD (coronary artery disease)   . Hyperlipidemia   . GERD (gastroesophageal reflux disease)   . History of renal stone   . Stroke [facility])     ? TIA per pt  . Shortness of breath dyspnea     with exertion  . Pneumonia   . Dyslexia   . Enlarged prostate   . Headache     hx of - none in years  . Anemia   . Loose stools    . Flatulence/gas pain/belching     Past Surgical History  Procedure Laterality Date  . Esophagus surgery  2006  . Spine surgery      bone spurs  . Cardiac catheterization  2009    two stents placed  . Tonsillectomy    . Eye surgery Bilateral     cataract surgery with lens implants  . Colonoscopy    . Esophagogastroduodenoscopy (egd) with propofol N/A 05/16/2015    Procedure: ESOPHAGOGASTRODUODENOSCOPY (EGD) WITH PROPOFOL;  Surgeon: Ricky Pole, MD;  Location: [facility];  Service: Endoscopy;  Laterality: N/A;    Family History  Problem Relation Age of Onset  . COPD Father   . Diabetes Neg Hx     Social History:  reports that he quit smoking about 14 years ago. His smoking use included Cigarettes. He has a 42 pack-year smoking history. He has never used smokeless tobacco. He reports that he does not drink alcohol or use illicit drugs.  Allergies:  Allergies  Allergen Reactions  . Dust Mite Extract Other (See Comments)    Runny nose   . Pollen Extract Other (See Comments)    Runny nose    Medications:  Scheduled Meds: . amiodarone  200 mg Oral BID  . apixaban  5 mg  Oral BID  . aspirin EC  81 mg Oral Daily  . atorvastatin  80 mg Oral Daily  . buPROPion  300 mg Oral Daily  . doxercalciferol  2.5 mcg Oral Daily  . DULoxetine  60 mg Oral Daily  . fluticasone  2 spray Each Nare Daily  . fluticasone furoate-vilanterol  1 puff Inhalation Daily  . gabapentin  600 mg Oral TID  . insulin aspart  0-15 Units Subcutaneous TID WC  . insulin aspart  4 Units Subcutaneous TID WC  . insulin glargine  60 Units Subcutaneous QHS  . levothyroxine  112 mcg Oral QAC breakfast  . montelukast  10 mg Oral QHS  . nebivolol  10 mg Oral Daily  . olopatadine  1 drop Both Eyes BID  . pantoprazole  40 mg Oral Daily  . ranolazine  500 mg Oral BID  . roflumilast  500 mcg Oral Daily  . sodium chloride  500 mL Intravenous Once  . testosterone  5 g Transdermal Daily  . tiotropium  18 mcg  Inhalation Daily   Continuous Infusions:  PRN Meds:.acetaminophen, ipratropium-albuterol, morphine injection, nitroGLYCERIN, ondansetron (ZOFRAN) IV, ondansetron   Results for orders placed or performed during the hospital encounter of 08/01/15 (from the past 48 hour(s))  CBC     Status: Abnormal   Collection Time: 08/01/15  4:20 PM  Result Value Ref Range   WBC 21.4 (H) 4.0 - 10.5 K/uL   RBC 3.42 (L) 4.22 - 5.81 MIL/uL   Hemoglobin 10.2 (L) 13.0 - 17.0 g/dL   HCT 30.7 (L) 39.0 - 52.0 %   MCV 89.8 78.0 - 100.0 fL   MCH 29.8 26.0 - 34.0 pg   MCHC 33.2 30.0 - 36.0 g/dL   RDW 13.2 11.5 - 15.5 %   Platelets 224 150 - 400 K/uL  Troponin I     Status: Abnormal   Collection Time: 08/01/15  4:20 PM  Result Value Ref Range   Troponin I 0.06 (H) <0.031 ng/mL    Comment:        PERSISTENTLY INCREASED TROPONIN VALUES IN THE RANGE OF 0.04-0.49 ng/mL CAN BE SEEN IN:       -UNSTABLE ANGINA       -CONGESTIVE HEART FAILURE       -MYOCARDITIS       -CHEST TRAUMA       -ARRYHTHMIAS       -LATE PRESENTING MYOCARDIAL INFARCTION       -COPD   CLINICAL FOLLOW-UP RECOMMENDED.   Comprehensive metabolic panel     Status: Abnormal   Collection Time: 08/01/15  4:20 PM  Result Value Ref Range   Sodium 128 (L) 135 - 145 mmol/L   Potassium 4.7 3.5 - 5.1 mmol/L   Chloride 102 101 - 111 mmol/L   CO2 18 (L) 22 - 32 mmol/L   Glucose, Bld 504 (H) 65 - 99 mg/dL   BUN 52 (H) 6 - 20 mg/dL   Creatinine, Ser 2.88 (H) 0.61 - 1.24 mg/dL   Calcium 8.0 (L) 8.9 - 10.3 mg/dL   Total Protein 6.1 (L) 6.5 - 8.1 g/dL   Albumin 2.7 (L) 3.5 - 5.0 g/dL   AST 12 (L) 15 - 41 U/L   ALT 9 (L) 17 - 63 U/L   Alkaline Phosphatase 109 38 - 126 U/L   Total Bilirubin 0.8 0.3 - 1.2 mg/dL   GFR calc non Af Amer 20 (L) >60 mL/min   GFR calc Af Amer 23 (L) >60  mL/min    Comment: (NOTE) The eGFR has been calculated using the CKD EPI equation. This calculation has not been validated in all clinical situations. eGFR's  persistently <60 mL/min signify possible Chronic Kidney Disease.    Anion gap 8 5 - 15  Brain natriuretic peptide     Status: Abnormal   Collection Time: 08/01/15  4:20 PM  Result Value Ref Range   B Natriuretic Peptide 483.1 (H) 0.0 - 100.0 pg/mL  I-Stat CG4 Lactic Acid, ED     Status: None   Collection Time: 08/01/15  4:34 PM  Result Value Ref Range   Lactic Acid, Venous 1.03 0.5 - 2.0 mmol/L  POC CBG, ED     Status: Abnormal   Collection Time: 08/01/15  6:15 PM  Result Value Ref Range   Glucose-Capillary 442 (H) 65 - 99 mg/dL  Glucose, capillary     Status: Abnormal   Collection Time: 08/01/15  8:20 PM  Result Value Ref Range   Glucose-Capillary 335 (H) 65 - 99 mg/dL  Procalcitonin - Baseline     Status: None   Collection Time: 08/01/15  9:54 PM  Result Value Ref Range   Procalcitonin 1.31 ng/mL    Comment:        Interpretation: PCT > 0.5 ng/mL and <= 2 ng/mL: Systemic infection (sepsis) is possible, but other conditions are known to elevate PCT as well. (NOTE)         ICU PCT Algorithm               Non ICU PCT Algorithm    ----------------------------     ------------------------------         PCT < 0.25 ng/mL                 PCT < 0.1 ng/mL     Stopping of antibiotics            Stopping of antibiotics       strongly encouraged.               strongly encouraged.    ----------------------------     ------------------------------       PCT level decrease by               PCT < 0.25 ng/mL       >= 80% from peak PCT       OR PCT 0.25 - 0.5 ng/mL          Stopping of antibiotics                                             encouraged.     Stopping of antibiotics           encouraged.    ----------------------------     ------------------------------       PCT level decrease by              PCT >= 0.25 ng/mL       < 80% from peak PCT        AND PCT >= 0.5 ng/mL             Continuing antibiotics  encouraged.        Continuing antibiotics            encouraged.    ----------------------------     ------------------------------     PCT level increase compared          PCT > 0.5 ng/mL         with peak PCT AND          PCT >= 0.5 ng/mL             Escalation of antibiotics                                          strongly encouraged.      Escalation of antibiotics        strongly encouraged.   Troponin I     Status: Abnormal   Collection Time: 08/01/15  9:54 PM  Result Value Ref Range   Troponin I 0.07 (H) <0.031 ng/mL    Comment:        PERSISTENTLY INCREASED TROPONIN VALUES IN THE RANGE OF 0.04-0.49 ng/mL CAN BE SEEN IN:       -UNSTABLE ANGINA       -CONGESTIVE HEART FAILURE       -MYOCARDITIS       -CHEST TRAUMA       -ARRYHTHMIAS       -LATE PRESENTING MYOCARDIAL INFARCTION       -COPD   CLINICAL FOLLOW-UP RECOMMENDED.   Basic metabolic panel     Status: Abnormal   Collection Time: 08/02/15  3:31 AM  Result Value Ref Range   Sodium 136 135 - 145 mmol/L   Potassium 4.3 3.5 - 5.1 mmol/L   Chloride 107 101 - 111 mmol/L   CO2 20 (L) 22 - 32 mmol/L   Glucose, Bld 315 (H) 65 - 99 mg/dL   BUN 49 (H) 6 - 20 mg/dL   Creatinine, Ser 3.02 (H) 0.61 - 1.24 mg/dL   Calcium 8.1 (L) 8.9 - 10.3 mg/dL   GFR calc non Af Amer 19 (L) >60 mL/min   GFR calc Af Amer 21 (L) >60 mL/min    Comment: (NOTE) The eGFR has been calculated using the CKD EPI equation. This calculation has not been validated in all clinical situations. eGFR's persistently <60 mL/min signify possible Chronic Kidney Disease.    Anion gap 9 5 - 15  CBC WITH DIFFERENTIAL     Status: Abnormal   Collection Time: 08/02/15  3:31 AM  Result Value Ref Range   WBC 17.4 (H) 4.0 - 10.5 K/uL   RBC 3.22 (L) 4.22 - 5.81 MIL/uL   Hemoglobin 9.1 (L) 13.0 - 17.0 g/dL   HCT 28.9 (L) 39.0 - 52.0 %   MCV 89.8 78.0 - 100.0 fL   MCH 28.3 26.0 - 34.0 pg   MCHC 31.5 30.0 - 36.0 g/dL   RDW 13.5 11.5 - 15.5 %   Platelets 195 150 - 400 K/uL    Neutrophils Relative % 86 %   Neutro Abs 14.9 (H) 1.7 - 7.7 K/uL   Lymphocytes Relative 7 %   Lymphs Abs 1.2 0.7 - 4.0 K/uL   Monocytes Relative 6 %   Monocytes Absolute 1.1 (H) 0.1 - 1.0 K/uL   Eosinophils Relative 1 %   Eosinophils Absolute 0.2 0.0 - 0.7 K/uL   Basophils Relative 0 %   Basophils  Absolute 0.0 0.0 - 0.1 K/uL  Troponin I     Status: Abnormal   Collection Time: 08/02/15  3:31 AM  Result Value Ref Range   Troponin I 0.06 (H) <0.031 ng/mL    Comment:        PERSISTENTLY INCREASED TROPONIN VALUES IN THE RANGE OF 0.04-0.49 ng/mL CAN BE SEEN IN:       -UNSTABLE ANGINA       -CONGESTIVE HEART FAILURE       -MYOCARDITIS       -CHEST TRAUMA       -ARRYHTHMIAS       -LATE PRESENTING MYOCARDIAL INFARCTION       -COPD   CLINICAL FOLLOW-UP RECOMMENDED.     Dg Chest 2 View  08/01/2015  CLINICAL DATA:  Chest pains and cough. Sore throat. History of esophageal cancer. EXAM: CHEST  2 VIEW COMPARISON:  07/26/2015 FINDINGS: Lungs are clear bilaterally. Retrocardiac density is compatible with history of esophageal cancer and previous gastric pull-through procedure. There is a nodular density in the right lower chest which was present on the exam from 09/21/2014 and may be related to a nipple shadow. Atherosclerotic calcifications in the thoracic aorta. IMPRESSION: No acute chest findings. Electronically Signed   By: Markus Daft M.D.   On: 08/01/2015 16:04    Review of Systems  Constitutional: Negative for fever and diaphoresis.  HENT: Positive for sore throat.   Respiratory: Positive for shortness of breath. Negative for cough.   Cardiovascular: Positive for chest pain and leg swelling. Negative for orthopnea and PND.  Gastrointestinal: Positive for nausea. Negative for vomiting, abdominal pain, blood in stool and melena.  Genitourinary: Negative for hematuria.  Neurological: Negative for dizziness.  All other systems reviewed and are negative.  Blood pressure 133/58, pulse 89,  temperature 97.6 F (36.4 C), temperature source Oral, resp. rate 20, height 5' 11"  (1.803 m), weight 200 lb 9.9 oz (91 kg), SpO2 100 %. Physical Exam  Nursing note and vitals reviewed. Constitutional: He is oriented to person, place, and time. He appears well-developed and well-nourished. No distress.  HENT:  Head: Normocephalic and atraumatic.  Eyes: EOM are normal. Pupils are equal, round, and reactive to light.  Neck: Normal range of motion. Neck supple. No JVD present.  Cardiovascular: Normal rate, regular rhythm, S1 normal and S2 normal.   No murmur heard. Pulses:      Radial pulses are 2+ on the right side, and 2+ on the left side.       Dorsalis pedis pulses are 2+ on the right side, and 2+ on the left side.  Respiratory: Effort normal. He has no wheezes. He has no rales.  Decreased BS throughout but clear  GI: Soft. Bowel sounds are normal. He exhibits no distension.  Musculoskeletal: He exhibits no edema.  Lymphadenopathy:    He has no cervical adenopathy.  Neurological: He is alert and oriented to person, place, and time. He exhibits normal muscle tone.  Skin: Skin is warm and dry.  Psychiatric: He has a normal mood and affect.    Assessment/Plan: Principal Problem:   Chest pain Active Problems:   Acquired autoimmune hypothyroidism   History of coronary artery disease   Major depressive disorder, recurrent episode, moderate (HCC)   HTN (hypertension)   A-fib (HCC)   Type II diabetes mellitus, uncontrolled (HCC)   Chronic respiratory failure (HCC)   CKD (chronic kidney disease)   Leukocytosis   Hyperglycemia   Chronic renal insufficiency   79  yo male with prior medical history of PAF-on Eliquis 5 mg orally twice a day, coronary artery disease status post placement of 2 stents in 2013, COPD on home O2 at 2 liters, congestive heart failure, hypertension, IDDM, hyperlipidemia, diabetic neuropathy in legs.  He presents with CP and was started on Augmentin last week for  PNA.  His CP is pleuritic an likely a result of the PNA.  It is very severe with a deep breath.  Troponin was only up to 0.07.  I do not think further cardiac evaluation is required.  Recommend follow up with Dr. Meda Baker.  He is due in May.  He is maintaining SR on Amiodarone.  BP stable.  On statin, ranexa and bystolic.    Tarri Fuller, [place] 08/02/2015, 7:19 AM

## 2015-08-02 NOTE — Progress Notes (Signed)
Pt CBG was 61. 15g Carb snack given. CBG rechecked it was 86. Pt did get nauseated

## 2015-08-02 NOTE — Progress Notes (Signed)
Pt has not voided the entire shift. Bladder scan shows 477ml. Will notify MD

## 2015-08-02 NOTE — Progress Notes (Signed)
Received call, pt vomiting, stopped lantus and meal coverage insulin, added IVF, asked for abd xray to rule out ileus.  Faye Ramsay, MD  [facility] Pager [phone]  If 7PM-7AM, please contact night-coverage [email or web address] Password [email or web address]

## 2015-08-02 NOTE — Progress Notes (Signed)
Inpatient Diabetes Program Recommendations  AACE/ADA: New Consensus Statement on Inpatient Glycemic Control (2015)  Target Ranges:  Prepandial:   less than 140 mg/dL      Peak postprandial:   less than 180 mg/dL (1-2 hours)      Critically ill patients:  140 - 180 mg/dL   Review of Glycemic Control  Diabetes history: DM 2 Sees Dr. Dwyane Dee (Endocrinology) Outpatient Diabetes medications: Toujeo 60 QAM, Humalog 10 units TID meal coverage Current orders for Inpatient glycemic control: Lantus 60 units QHS, Novolog Moderate TID + Novolog 4 units TID meal coverage  Inpatient Diabetes Program Recommendations: Insulin - Meal Coverage: Patient takes Humalog 10 units TID at home for meal coverage. Please consider increasing meal coverage to Novolog 6 units TID.  Thanks,  Tama Headings RN, MSN, [facility] Inpatient Diabetes Coordinator Team Pager [phone] (8a-5p)

## 2015-08-03 ENCOUNTER — Observation Stay (HOSPITAL_COMMUNITY): Payer: Medicare Other

## 2015-08-03 ENCOUNTER — Ambulatory Visit: Payer: Medicare Other | Admitting: Physician Assistant

## 2015-08-03 ENCOUNTER — Telehealth: Payer: Self-pay | Admitting: Physician Assistant

## 2015-08-03 DIAGNOSIS — R079 Chest pain, unspecified: Secondary | ICD-10-CM | POA: Diagnosis present

## 2015-08-03 LAB — URINALYSIS, ROUTINE W REFLEX MICROSCOPIC
BILIRUBIN URINE: NEGATIVE
Glucose, UA: NEGATIVE mg/dL
Hgb urine dipstick: NEGATIVE
KETONES UR: NEGATIVE mg/dL
NITRITE: NEGATIVE
PROTEIN: NEGATIVE mg/dL
Specific Gravity, Urine: 1.023 (ref 1.005–1.030)
pH: 5 (ref 5.0–8.0)

## 2015-08-03 LAB — URINE MICROSCOPIC-ADD ON

## 2015-08-03 LAB — GLUCOSE, CAPILLARY
GLUCOSE-CAPILLARY: 178 mg/dL — AB (ref 65–99)
GLUCOSE-CAPILLARY: 144 mg/dL — AB (ref 65–99)
Glucose-Capillary: 159 mg/dL — ABNORMAL HIGH (ref 65–99)
Glucose-Capillary: 181 mg/dL — ABNORMAL HIGH (ref 65–99)

## 2015-08-03 LAB — BASIC METABOLIC PANEL
ANION GAP: 11 (ref 5–15)
BUN: 53 mg/dL — AB (ref 6–20)
CHLORIDE: 107 mmol/L (ref 101–111)
CO2: 17 mmol/L — ABNORMAL LOW (ref 22–32)
Calcium: 8.2 mg/dL — ABNORMAL LOW (ref 8.9–10.3)
Creatinine, Ser: 3.28 mg/dL — ABNORMAL HIGH (ref 0.61–1.24)
GFR calc Af Amer: 19 mL/min — ABNORMAL LOW (ref >60)
GFR calc non Af Amer: 17 mL/min — ABNORMAL LOW (ref >60)
Glucose, Bld: 159 mg/dL — ABNORMAL HIGH (ref 65–99)
POTASSIUM: 4.4 mmol/L (ref 3.5–5.1)
Sodium: 135 mmol/L (ref 135–145)

## 2015-08-03 LAB — PROCALCITONIN: Procalcitonin: 1.14 ng/mL

## 2015-08-03 LAB — CBC
HCT: 27 % — ABNORMAL LOW (ref 39.0–52.0)
Hemoglobin: 8.4 g/dL — ABNORMAL LOW (ref 13.0–17.0)
MCH: 28.3 pg (ref 26.0–34.0)
MCHC: 31.1 g/dL (ref 30.0–36.0)
MCV: 90.9 fL (ref 78.0–100.0)
PLATELETS: 223 10*3/uL (ref 150–400)
RBC: 2.97 MIL/uL — AB (ref 4.22–5.81)
RDW: 13.9 % (ref 11.5–15.5)
WBC: 18.6 10*3/uL — AB (ref 4.0–10.5)

## 2015-08-03 LAB — HEMOGLOBIN A1C
Hgb A1c MFr Bld: 9.3 % — ABNORMAL HIGH (ref 4.8–5.6)
Mean Plasma Glucose: 220 mg/dL

## 2015-08-03 MED ORDER — NEBIVOLOL HCL 5 MG PO TABS
5.0000 mg | ORAL_TABLET | Freq: Every day | ORAL | Status: DC
  Administered 2015-08-04: 5 mg via ORAL
  Filled 2015-08-03 (×2): qty 1

## 2015-08-03 MED ORDER — DEXTROSE 5 % IV SOLN
1.0000 g | INTRAVENOUS | Status: DC
  Administered 2015-08-03 – 2015-08-07 (×5): 1 g via INTRAVENOUS
  Filled 2015-08-03 (×6): qty 10

## 2015-08-03 MED ORDER — FUROSEMIDE 40 MG PO TABS
40.0000 mg | ORAL_TABLET | Freq: Every day | ORAL | Status: DC
  Administered 2015-08-03 – 2015-08-04 (×2): 40 mg via ORAL
  Filled 2015-08-03 (×2): qty 1

## 2015-08-03 MED ORDER — OLOPATADINE HCL 0.1 % OP SOLN
1.0000 [drp] | Freq: Two times a day (BID) | OPHTHALMIC | Status: DC | PRN
  Filled 2015-08-03: qty 5

## 2015-08-03 MED ORDER — OXYCODONE-ACETAMINOPHEN 5-325 MG PO TABS
1.0000 | ORAL_TABLET | ORAL | Status: DC | PRN
  Administered 2015-08-04: 1 via ORAL
  Administered 2015-08-05: 2 via ORAL
  Administered 2015-08-05 – 2015-08-09 (×2): 1 via ORAL
  Administered 2015-08-12: 2 via ORAL
  Filled 2015-08-03: qty 2
  Filled 2015-08-03: qty 1
  Filled 2015-08-03 (×2): qty 2
  Filled 2015-08-03 (×3): qty 1

## 2015-08-03 NOTE — Progress Notes (Addendum)
Patient ID: Ricky Baker, male   DOB: 03/18/1936, 80 y.o.   MRN: [id number]    PROGRESS NOTE    MIKAEEL BERDAN  [id number] DOB: 01-Jun-1935 DOA: 08/01/2015  PCP: Leeanne Rio, PA-C   Outpatient Specialists:   Brief Narrative:  80 y.o. male with paroxysmal atrial fibrillation on Eliquis, chronic diastolic CHF, COPD with home O2 requirement, chronic kidney disease stage III, Hypothyroidism, depression, and esophageal cancer status post resection in 2006 presented to the ED at [place] [place] with chest pain of more than one week's duration.patient, PCP on 07/26/2015, diagnosed him with pneumonia based on chest x-ray, and treated with a course of Augmentin and prednisone.   ED Course: Upon arrival to the ED, patient was found to be afebrile, saturating well on 2 L/m, and with vital signs stable. Chest x-ray demonstrates resolution of the prior infiltrate, CBC with leukocytosis 21,400 and hemoglobin of 10.2.  Assessment & Plan:  1. Chest pain  - Initial troponin 0.06; EKG without acute ischemic features  - ASA 324 mg given on arrival, Lipitor given - Suspect this is MSK pain secondary to recent PNA and associated coughing fits   - per cardiology, no need for further cardiac work up at this time - outpatient follow up with Dr. Meda Coffee   2. COPD  - Requiring supplemental O2 around-the-clock at baseline - Appears stable, saturating well on his usual 2 Lpm - Continue DuoNeb prn, Singulair QD   3. Chronic diastolic CHF  - weight is up from 200 --> 208 lbs, stop IVF, place on Lasix 40 mg PO QD as per home regimen  - daily wts, strict I/Os, fluid-restrict diet - TTE (04/04/15) with EF 0000000, grade 1 diastolic dysfunction, mild AS  - hold off on spironolactone as BP on low end of normal   4. Paroxysmal atrial fibrillation - In sinus rhythm on admission EKG  - CHADS-VASc is 3 (age x2, CHF, DM) - Continue AC with Eliquis  - Continue rate-control with amiodarone,  holding cardizem due to soft BP - pt already on home regimen Nebivolol but will lower the dose until BP stabilizes   5. 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 Leukocytosis, anemia - Leukocytosis likely secondary to recent steroid use; completed 5-day course of prednisone on 4/25 - Hgb is 10.2 on admission, 9.1 on 4/27 - No sign of active blood loss  - CBC in AM  7. Acute on CKD stage III, hyponatremia/ urinary retention  - SCr 2.88 on admission but up this AM 3.38 - Avoiding nephrotoxins where possible  - Na is WNL this AM - unclear why Cr up, ? Pre renal etiology from vomiting and poor oral intake, IVF have been given but did not help - will continue to monitor closely, renal US pending due to worsening Cr and urinary retention  - BMP in AM  8. Depression  - Appears stable  - Continue current-dose Wellbutrin   9. Vomiting  - abd XRAY unremarkable - possibly related to Morphine  - hold Morphine and give percocet   DVT prophylaxis: on Eliquis  Code Status: Full  Family  Communication: Patient at bedside  Disposition Plan: Independent living facility by 4/29  Consultants:   None  Procedures:   None  Antimicrobials:  None  Subjective: Nausea this AM, last episode of vomiting early this AM.   Objective: Filed Vitals:   08/02/15 1742 08/02/15 2154 08/03/15 0310 08/03/15 0430  BP: 92/55 95/55  104/57  Pulse:  65  71  Temp:  97.6 F (36.4 C)  98.4 F (36.9 C)  TempSrc:  Oral  Oral  Resp:  18  16  Height:      Weight:   94.666 kg (208 lb 11.2 oz)   SpO2:  100%  99%    Intake/Output Summary (Last 24 hours) at 08/03/15 0710 Last data  filed at 08/03/15 0400  Gross per 24 hour  Intake   2040 ml  Output    200 ml  Net   1840 ml   Filed Weights   08/01/15 2020 08/02/15 0448 08/03/15 0310  Weight: 91.536 kg (201 lb 12.8 oz) 91 kg (200 lb 9.9 oz) 94.666 kg (208 lb 11.2 oz)    Examination:  General exam: Appears calm and comfortable  Respiratory system: Diminished breath sounds at bases  Cardiovascular system: S1 & S2 heard, RRR. SEM 2/6, No JVD, rubs, gallops or clicks. No pedal edema. Gastrointestinal system: Abdomen is nondistended, soft and nontender.  Central nervous system: Alert and oriented. No focal neurological deficits. Psychiatry: Judgement and insight appear normal. Mood & affect appropriate.   Data Reviewed: I have personally reviewed following labs and imaging studies  CBC:  Recent Labs Lab 08/01/15 1620 08/02/15 0331  WBC 21.4* 17.4*  NEUTROABS  --  14.9*  HGB 10.2* 9.1*  HCT 30.7* 28.9*  MCV 89.8 89.8  PLT 224 0000000   Basic Metabolic Panel:  Recent Labs Lab 08/01/15 1620 08/02/15 0331 08/03/15 0503  NA 128* 136 135  K 4.7 4.3 4.4  CL 102 107 107  CO2 18* 20* 17*  GLUCOSE 504* 315* 159*  BUN 52* 49* 53*  CREATININE 2.88* 3.02* 3.28*  CALCIUM 8.0* 8.1* 8.2*   Liver Function Tests:  Recent Labs Lab 08/01/15 1620  AST 12*  ALT 9*  ALKPHOS 109  BILITOT 0.8  PROT 6.1*  ALBUMIN 2.7*   Cardiac Enzymes:  Recent Labs Lab 08/01/15 1620 08/01/15 2154 08/02/15 0331 08/02/15 1052  TROPONINI 0.06* 0.07* 0.06* 0.09*   BNP (last 3 results)  Recent Labs  10/30/14 1056  PROBNP 117.0*   CBG:  Recent Labs Lab 08/02/15 1134 08/02/15 1629 08/02/15 1751 08/02/15 1945 08/02/15 2156  GLUCAP 232* 61* 86 94 123*    Radiology Studies: Dg Chest 2 View 08/01/2015   No acute chest findings.   Scheduled Meds: . amiodarone  200 mg Oral BID  . apixaban  5 mg Oral BID  . aspirin EC  81 mg Oral Daily  . atorvastatin  80 mg Oral Daily  . buPROPion  300 mg Oral Daily  .  doxercalciferol  2.5 mcg Oral Daily  . DULoxetine  60 mg Oral Daily  . fluticasone  2 spray Each Nare Daily  . gabapentin  600 mg Oral TID  . insulin aspart  0-15 Units Subcutaneous TID WC  . insulin aspart  4 Units Subcutaneous TID WC  . insulin glargine  60 Units Subcutaneous QHS  . levothyroxine  112 mcg Oral QAC breakfast  . montelukast  10 mg Oral QHS  . nebivolol  10 mg Oral Daily  . olopatadine  1  drop Both Eyes BID  . pantoprazole  40 mg Oral Daily  . ranolazine  500 mg Oral BID  . roflumilast  500 mcg Oral Daily  . testosterone  5 g Transdermal Daily  . tiotropium  18 mcg Inhalation Daily     LOS: 2 days   Time spent: 20 minutes   Faye Ramsay, MD [facility] Pager [phone]  If 7PM-7AM, please contact night-coverage [email or web address] Password [email or web address] 08/03/2015, 7:10 AM

## 2015-08-03 NOTE — Evaluation (Signed)
Physical Therapy Evaluation Patient Details Name: Ricky Baker MRN: [id number] DOB: Oct 11, 1935 Today's Date: 08/03/2015   History of Present Illness  Patient is a 80 yo male admitted 08/01/15 with pleuritic chest pain, possibly musculoskeletal pain from recent pna, CHF.    PMH:  PAF, CHF, COPD on home O2, CKD, Depression    Clinical Impression  Patient presents with problems listed below.  Will benefit from acute PT to maximize functional mobility prior to discharge back to ILF.  Recommended HHPT - patient declined.  Will continue to follow for PT while inpatient.    Follow Up Recommendations Home health PT;Supervision - Intermittent (Patient declined HHPT)    Equipment Recommendations  None recommended by PT    Recommendations for Other Services       Precautions / Restrictions Precautions Precautions: Fall Precaution Comments: On Ow Restrictions Weight Bearing Restrictions: No      Mobility  Bed Mobility Overal bed mobility: Needs Assistance Bed Mobility: Supine to Sit;Sit to Supine     Supine to sit: Mod assist Sit to supine: Min assist   General bed mobility comments: Verbal cues for technique.  Assist to raise trunk to upright sitting position, and to scoot hips to EOB in sitting using bed pad.  Patient with difficulty maintaining balance sitting EOB.  Able to sit 3-4 minutes, and requested to return to supine.  Assist to bring LE's onto bed.  Patient reports he does not use his bed.  He sleeps in recliner.  Patient became nauseated and vomited once returned to supine.  Transfers                 General transfer comment: Declined  Ambulation/Gait                Stairs            Wheelchair Mobility    Modified Rankin (Stroke Patients Only)       Balance Overall balance assessment: Needs assistance Sitting-balance support: Bilateral upper extremity supported Sitting balance-Leahy Scale: Poor Sitting balance - Comments: Required UE  support to maintain balance Postural control: Posterior lean                                   Pertinent Vitals/Pain Pain Assessment: No/denies pain    Home Living Family/patient expects to be discharged to:: Other (Comment) [facility]) Living Arrangements: Alone Available Help at Discharge: Family;Available PRN/intermittently (Sister) Type of Home: Independent living facility Home Access: Level entry     Home Layout: One level Home Equipment: Walker - 4 wheels;Cane - single point      Prior Function Level of Independence: Independent with assistive device(s);Needs assistance   Gait / Transfers Assistance Needed: Ambulates with rollator  ADL's / Homemaking Assistance Needed: Independent with bathing, dressing, grooming.  Facility provides meals, housekeeping.  Comments: Patient sleeps in recliner     Hand Dominance        Extremity/Trunk Assessment   Upper Extremity Assessment: Generalized weakness           Lower Extremity Assessment: Generalized weakness      Cervical / Trunk Assessment: Kyphotic  Communication   Communication: HOH  Cognition Arousal/Alertness: Awake/alert Behavior During Therapy: WFL for tasks assessed/performed;Anxious Overall Cognitive Status: Within Functional Limits for tasks assessed                      General Comments  Exercises        Assessment/Plan    PT Assessment Patient needs continued PT services  PT Diagnosis Difficulty walking;Generalized weakness   PT Problem List Decreased strength;Decreased activity tolerance;Decreased balance;Decreased mobility;Decreased knowledge of use of DME;Cardiopulmonary status limiting activity;Obesity  PT Treatment Interventions DME instruction;Gait training;Functional mobility training;Therapeutic activities;Therapeutic exercise;Patient/family education   PT Goals (Current goals can be found in the Care Plan section) Acute Rehab PT Goals Patient Stated  Goal: To get stronger.  To return home PT Goal Formulation: With patient Time For Goal Achievement: 08/10/15 Potential to Achieve Goals: Fair    Frequency Min 3X/week   Barriers to discharge        Co-evaluation               End of Session Equipment Utilized During Treatment: Oxygen Activity Tolerance: Patient limited by fatigue Patient left: in bed;with call bell/phone within reach      Functional Assessment Tool Used: Clinical judgement Functional Limitation: Changing and maintaining body position Changing and Maintaining Body Position Current Status [id number]): At least 20 percent but less than 40 percent impaired, limited or restricted Changing and Maintaining Body Position Goal Status [id number]): 0 percent impaired, limited or restricted    Time: 1825-1847 PT Time Calculation (min) (ACUTE ONLY): 22 min   Charges:   PT Evaluation $PT Eval Moderate Complexity: 1 Procedure     PT G Codes:   PT G-Codes **NOT FOR INPATIENT CLASS** Functional Assessment Tool Used: Clinical judgement Functional Limitation: Changing and maintaining body position Changing and Maintaining Body Position Current Status [id number]): At least 20 percent but less than 40 percent impaired, limited or restricted Changing and Maintaining Body Position Goal Status [id number]): 0 percent impaired, limited or restricted    Despina Pole 08/03/2015, 7:35 PM Carita Pian. Sanjuana Kava, [place] Pager [phone]

## 2015-08-03 NOTE — Care Management Note (Addendum)
Case Management Note  Patient Details  Name: Ricky Baker MRN: [id number] Date of Birth: 10-20-1935  Subjective/Objective: Pt admitted for Pleuritic Chest Pain. Pt lives at the [facility]. Pt will benefit from [facility] RN for medication and disease management. CM provided pt with Personal Care List as well.                     Action/Plan:CM did offer choice and pt chose [facility] for Services. Pt has DME 02 via [facility] as well. CM did make referral and SOC to begin within 24-48 hrs post d/c. No further needs from CM at this time.   Expected Discharge Date:  08/03/15               Expected Discharge Plan:  [place]  In-House Referral:  NA  Discharge planning Services  CM Consult  Post Acute Care Choice:  Home Health Choice offered to:  Patient, Sibling  DME Arranged:  N/A DME Agency:  NA  HH Arranged:  RN [place] Agency:  [place]  Status of Service:  Completed, signed off  Medicare Important Message Given:    Date Medicare IM Given:    Medicare IM give by:    Date Additional Medicare IM Given:    Additional Medicare Important Message give by:     If discussed at [place] of Stay Meetings, dates discussed:    Additional Comments:  Bethena Roys, RN 08/03/2015, 3:04 PM

## 2015-08-03 NOTE — Progress Notes (Signed)
Pt spontaneously  voided 200 cc of clear urine.

## 2015-08-03 NOTE — Plan of Care (Signed)
Problem: Pain Managment: Goal: General experience of comfort will improve Outcome: Progressing Pt c/o mid chest pain at a 5/10. Pt was given tylenol 650 mg. Chest pain resolved. Will continue to monitor pt.

## 2015-08-03 NOTE — Progress Notes (Signed)
Pt has not voided since 10pm last night, bladder scan showed 550 cc of urine. MD paged .  Ferdinand Lango, RN

## 2015-08-03 NOTE — Progress Notes (Signed)
Bladder scan 500+, in and out cathed pt. 550 ml amber urine.

## 2015-08-03 NOTE — Care Management Obs Status (Signed)
[facility] NOTIFICATION   Patient Details  Name: Ricky Baker MRN: [id number] Date of Birth: 05/20/1935   Medicare Observation Status Notification Given:  Yes    Bethena Roys, RN 08/03/2015, 12:04 PM

## 2015-08-04 ENCOUNTER — Observation Stay (HOSPITAL_COMMUNITY): Payer: Medicare Other

## 2015-08-04 LAB — BASIC METABOLIC PANEL
Anion gap: 11 (ref 5–15)
BUN: 53 mg/dL — ABNORMAL HIGH (ref 6–20)
CALCIUM: 8.3 mg/dL — AB (ref 8.9–10.3)
CO2: 17 mmol/L — ABNORMAL LOW (ref 22–32)
Chloride: 106 mmol/L (ref 101–111)
Creatinine, Ser: 3.38 mg/dL — ABNORMAL HIGH (ref 0.61–1.24)
GFR calc Af Amer: 19 mL/min — ABNORMAL LOW (ref >60)
GFR, EST NON AFRICAN AMERICAN: 16 mL/min — AB (ref >60)
Glucose, Bld: 151 mg/dL — ABNORMAL HIGH (ref 65–99)
Potassium: 4.7 mmol/L (ref 3.5–5.1)
SODIUM: 134 mmol/L — AB (ref 135–145)

## 2015-08-04 LAB — CBC
HCT: 30.2 % — ABNORMAL LOW (ref 39.0–52.0)
Hemoglobin: 9.5 g/dL — ABNORMAL LOW (ref 13.0–17.0)
MCH: 28.9 pg (ref 26.0–34.0)
MCHC: 31.5 g/dL (ref 30.0–36.0)
MCV: 91.8 fL (ref 78.0–100.0)
PLATELETS: 279 10*3/uL (ref 150–400)
RBC: 3.29 MIL/uL — ABNORMAL LOW (ref 4.22–5.81)
RDW: 14 % (ref 11.5–15.5)
WBC: 16.7 10*3/uL — ABNORMAL HIGH (ref 4.0–10.5)

## 2015-08-04 LAB — GLUCOSE, CAPILLARY
GLUCOSE-CAPILLARY: 121 mg/dL — AB (ref 65–99)
GLUCOSE-CAPILLARY: 117 mg/dL — AB (ref 65–99)
Glucose-Capillary: 144 mg/dL — ABNORMAL HIGH (ref 65–99)
Glucose-Capillary: 137 mg/dL — ABNORMAL HIGH (ref 65–99)

## 2015-08-04 MED ORDER — MAGNESIUM HYDROXIDE 400 MG/5ML PO SUSP
5.0000 mL | Freq: Every day | ORAL | Status: DC
  Administered 2015-08-04: 5 mL via ORAL
  Filled 2015-08-04: qty 30

## 2015-08-04 MED ORDER — MAGNESIUM HYDROXIDE 400 MG/5ML PO SUSP
30.0000 mL | Freq: Every day | ORAL | Status: DC
  Filled 2015-08-04 (×2): qty 30

## 2015-08-04 NOTE — Progress Notes (Signed)
Patient ID: Ricky Baker, male   DOB: September 20, 1935, 80 y.o.   MRN: [id number]    PROGRESS NOTE    Ricky Baker  [id number] DOB: 10/07/1935 DOA: 08/01/2015  PCP: Leeanne Rio, PA-C   Outpatient Specialists:   Brief Narrative:  80 y.o. male with paroxysmal atrial fibrillation on Eliquis, chronic diastolic CHF, COPD with home O2 requirement, chronic kidney disease stage III, Hypothyroidism, depression, and esophageal cancer status post resection in 2006 presented to the ED at [facility] [place] with chest pain of more than one week's duration.patient, PCP on 07/26/2015, diagnosed him with pneumonia based on chest x-ray, and treated with a course of Augmentin and prednisone.   ED Course: Upon arrival to the ED, patient was found to be afebrile, saturating well on 2 L/m, and with vital signs stable. Chest x-ray demonstrates resolution of the prior infiltrate, CBC with leukocytosis 21,400 and hemoglobin of 10.2.  Assessment & Plan:  1. Chest pain  - Initial troponin 0.06; EKG without acute ischemic features  - ASA 324 mg given on arrival, Lipitor given - Suspect this is MSK pain secondary to recent PNA and associated coughing fits   - per cardiology, no need for further cardiac work up at this time - outpatient follow up with Dr. Meda Coffee   2. COPD  - Requiring supplemental O2 around-the-clock at baseline - saturating well on his usual 2 Lpm - Continue DuoNeb prn, Singulair QD   3. Chronic diastolic CHF  - weight is up from 200 --> 208 --> 210 lbs, placed on Lasix 40 mg PO QD as per home regimen (4/28) - daily wts, strict I/Os, fluid-restrict diet - TTE (04/04/15) with EF 0000000, grade 1 diastolic dysfunction, mild AS  - hold off on spironolactone as BP on low end of normal   4. Paroxysmal atrial fibrillation - CHADS-VASc is 76 (age x2, CHF, DM) - Continue AC with Eliquis  - Continue rate-control with amiodarone, continue holding cardizem due to soft BP - pt  already on home regimen Nebivolol but may need to hold if SBP < 90  5. 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 Leukocytosis, anemia - Leukocytosis likely secondary to recent steroid use; completed 5-day course of prednisone on 4/25 - Hgb is 10.2 on admission, 9.1 on 4/27, WBC trending down  - No sign of active blood loss  - CBC in AM  7. Acute on CKD stage III, hyponatremia/ urinary retention  - SCr 2.88 on admission but up this AM 3.38 - Avoiding nephrotoxins where possible  - unclear why Cr up, ? Pre renal etiology from vomiting and poor oral intake, IVF have been given but did not help - will continue to monitor closely, renal US unremarkable  - BMP in AM  8. Depression  - Appears stable  - Continue current-dose Wellbutrin   9. Vomiting  - abd XRAY unremarkable - Morphine was stopped and it seems to be helping - I have requested CT chest which will hopefully help in further guidance  - may need GI input   10. ? UTI - pt with chronic urinary retention from BPH, sees urologist - some dysuria and possibly UTI based on UA - urine culture sent for analysis  and currently on Rocephin until urine cultures are back - WBC is trending down   DVT prophylaxis: on Eliquis  Code Status: Full  Family Communication: Patient at bedside  Disposition Plan: Independent living facility by 4/29  Consultants:   D/W vomiting with GI on call   Procedures:   None  Antimicrobials:  None  Subjective: Nausea still persistent but no vomiting.   Objective: Filed Vitals:   08/03/15 1300 08/03/15 2127 08/04/15 0500 08/04/15 0600  BP: 92/57 95/52  106/56  Pulse: 63 70  75  Temp: 98.2 F (36.8 C) 97.9 F (36.6 C)  97.8 F (36.6 C)  TempSrc: Oral Oral   Oral  Resp: 15 18  18   Height:      Weight:   95.391 kg (210 lb 4.8 oz)   SpO2: 97% 98%  95%    Intake/Output Summary (Last 24 hours) at 08/04/15 0800 Last data filed at 08/04/15 0600  Gross per 24 hour  Intake    526 ml  Output   1100 ml  Net   -574 ml   Filed Weights   08/02/15 0448 08/03/15 0310 08/04/15 0500  Weight: 91 kg (200 lb 9.9 oz) 94.666 kg (208 lb 11.2 oz) 95.391 kg (210 lb 4.8 oz)    Examination:  General exam: Appears calm and comfortable  Respiratory system: Diminished breath sounds at bases, mild crackles as well at bases  Cardiovascular system: S1 & S2 heard, RRR. SEM 2/6, No JVD, rubs, gallops or clicks. No pedal edema. Gastrointestinal system: Abdomen is nondistended, soft and nontender.  Central nervous system: Alert and oriented. No focal neurological deficits. Psychiatry: Judgement and insight appear normal. Mood & affect appropriate.   Data Reviewed: I have personally reviewed following labs and imaging studies  CBC:  Recent Labs Lab 08/01/15 1620 08/02/15 0331 08/03/15 1252  WBC 21.4* 17.4* 18.6*  NEUTROABS  --  14.9*  --   HGB 10.2* 9.1* 8.4*  HCT 30.7* 28.9* 27.0*  MCV 89.8 89.8 90.9  PLT 224 195 [id number]   Basic Metabolic Panel:  Recent Labs Lab 08/01/15 1620 08/02/15 0331 08/03/15 0503  NA 128* 136 135  K 4.7 4.3 4.4  CL 102 107 107  CO2 18* 20* 17*  GLUCOSE 504* 315* 159*  BUN 52* 49* 53*  CREATININE 2.88* 3.02* 3.28*  CALCIUM 8.0* 8.1* 8.2*   Liver Function Tests:  Recent Labs Lab 08/01/15 1620  AST 12*  ALT 9*  ALKPHOS 109  BILITOT 0.8  PROT 6.1*  ALBUMIN 2.7*   Cardiac Enzymes:  Recent Labs Lab 08/01/15 1620 08/01/15 2154 08/02/15 0331 08/02/15 1052  TROPONINI 0.06* 0.07* 0.06* 0.09*   BNP (last 3 results)  Recent Labs  10/30/14 1056  PROBNP 117.0*   CBG:  Recent Labs Lab 08/02/15 2156 08/03/15 0757 08/03/15 1157 08/03/15 1629 08/03/15 2203  GLUCAP 123* 159* 178* 181* 144*    Radiology  Studies: Dg Chest 2 View 08/01/2015   No acute chest findings.   Scheduled Meds: . amiodarone  200 mg Oral BID  . apixaban  5 mg Oral BID  . aspirin EC  81 mg Oral Daily  . atorvastatin  80 mg Oral Daily  . buPROPion  300 mg Oral Daily  . doxercalciferol  2.5 mcg Oral Daily  . DULoxetine  60 mg Oral Daily  . fluticasone  2 spray Each Nare Daily  . gabapentin  600 mg Oral TID  . insulin aspart  0-15 Units Subcutaneous TID WC  .  insulin aspart  4 Units Subcutaneous TID WC  . insulin glargine  60 Units Subcutaneous QHS  . levothyroxine  112 mcg Oral QAC breakfast  . montelukast  10 mg Oral QHS  . nebivolol  10 mg Oral Daily  . olopatadine  1 drop Both Eyes BID  . pantoprazole  40 mg Oral Daily  . ranolazine  500 mg Oral BID  . roflumilast  500 mcg Oral Daily  . testosterone  5 g Transdermal Daily  . tiotropium  18 mcg Inhalation Daily     LOS: 3 days   Time spent: 20 minutes   Faye Ramsay, MD [facility] Pager [phone]  If 7PM-7AM, please contact night-coverage [email or web address] Password [email or web address] 08/04/2015, 8:00 AM

## 2015-08-04 NOTE — Progress Notes (Signed)
PT Cancellation Note  Patient Details Name: [name] MRN: [id number] DOB: 1935/12/05   Cancelled Treatment:    Reason Eval/Treat Not Completed: Patient declined, no reason specified;Fatigue/lethargy limiting ability to participate. Pt reports coming in/out of slumber this afternoon.  He did not wish to walk with therapist today. "I will not need any therapy after I go home.  I have had therapy 4 times already after each time I go home from the hospital."  Pt reports he may have hired assist to help with household chores such as cleaning; has ability to utilizing room delivery from dining hall at home.  He typically uses electric cart during shopping, uses rollator for primary means of mobility, uses 2L oxygen at home but goes out in community without supplemental O2..  Will re-attempt mobility as able.  Spoke with nursing who is aware.  [place], PT [phone]  [place] 08/04/2015, 4:47 PM

## 2015-08-04 NOTE — Progress Notes (Signed)
pt bp 90/53, HR 73, afibrile. Asymptomatic,asleep. Dr Doyle Askew made aware.

## 2015-08-05 LAB — CBC
HEMATOCRIT: 27.6 % — AB (ref 39.0–52.0)
HEMOGLOBIN: 8.9 g/dL — AB (ref 13.0–17.0)
MCH: 29.6 pg (ref 26.0–34.0)
MCHC: 32.2 g/dL (ref 30.0–36.0)
MCV: 91.7 fL (ref 78.0–100.0)
Platelets: 310 10*3/uL (ref 150–400)
RBC: 3.01 MIL/uL — AB (ref 4.22–5.81)
RDW: 14.3 % (ref 11.5–15.5)
WBC: 15.9 10*3/uL — ABNORMAL HIGH (ref 4.0–10.5)

## 2015-08-05 LAB — URINE CULTURE

## 2015-08-05 LAB — BASIC METABOLIC PANEL
Anion gap: 10 (ref 5–15)
BUN: 60 mg/dL — AB (ref 6–20)
CHLORIDE: 103 mmol/L (ref 101–111)
CO2: 18 mmol/L — AB (ref 22–32)
Calcium: 8.2 mg/dL — ABNORMAL LOW (ref 8.9–10.3)
Creatinine, Ser: 3.71 mg/dL — ABNORMAL HIGH (ref 0.61–1.24)
GFR calc Af Amer: 17 mL/min — ABNORMAL LOW (ref >60)
GFR calc non Af Amer: 14 mL/min — ABNORMAL LOW (ref >60)
GLUCOSE: 124 mg/dL — AB (ref 65–99)
POTASSIUM: 5.2 mmol/L — AB (ref 3.5–5.1)
SODIUM: 131 mmol/L — AB (ref 135–145)

## 2015-08-05 LAB — GLUCOSE, CAPILLARY
GLUCOSE-CAPILLARY: 99 mg/dL (ref 65–99)
GLUCOSE-CAPILLARY: 179 mg/dL — AB (ref 65–99)
Glucose-Capillary: 128 mg/dL — ABNORMAL HIGH (ref 65–99)
Glucose-Capillary: 133 mg/dL — ABNORMAL HIGH (ref 65–99)

## 2015-08-05 LAB — CK: CK TOTAL: 449 U/L — AB (ref 49–397)

## 2015-08-05 LAB — PROCALCITONIN: Procalcitonin: 0.49 ng/mL

## 2015-08-05 LAB — SODIUM, URINE, RANDOM: Sodium, Ur: <10 mmol/L

## 2015-08-05 LAB — MAGNESIUM: MAGNESIUM: 1.9 mg/dL (ref 1.7–2.4)

## 2015-08-05 LAB — CREATININE, URINE, RANDOM: CREATININE, URINE: 57.49 mg/dL

## 2015-08-05 MED ORDER — SODIUM CHLORIDE 0.9 % IV SOLN
INTRAVENOUS | Status: DC
  Administered 2015-08-05: 20:00:00 via INTRAVENOUS

## 2015-08-05 MED ORDER — SENNA 8.6 MG PO TABS
1.0000 | ORAL_TABLET | Freq: Two times a day (BID) | ORAL | Status: DC
  Administered 2015-08-05 – 2015-08-13 (×16): 8.6 mg via ORAL
  Filled 2015-08-05 (×17): qty 1

## 2015-08-05 MED ORDER — SODIUM BICARBONATE 650 MG PO TABS
650.0000 mg | ORAL_TABLET | Freq: Two times a day (BID) | ORAL | Status: DC
  Administered 2015-08-06 – 2015-08-13 (×16): 650 mg via ORAL
  Filled 2015-08-05 (×16): qty 1

## 2015-08-05 MED ORDER — NEBIVOLOL HCL 5 MG PO TABS
2.5000 mg | ORAL_TABLET | Freq: Every day | ORAL | Status: DC
  Administered 2015-08-05 – 2015-08-13 (×9): 2.5 mg via ORAL
  Filled 2015-08-05 (×8): qty 1

## 2015-08-05 NOTE — Progress Notes (Signed)
Pt has no IV access. Awaiting IV team, to start IVF and antibiotics

## 2015-08-05 NOTE — Consult Note (Signed)
[facility] Renal Consultation Note  Requesting MD: [facility]ad Indication for Consultation: AoCKD  HPI:  Ricky Baker is a 80 y.o. male with paroxysmal atrial fibrillation on Eliquis, diastolic CHF, Aortic stenosis, COPD with home O2 requirement, chronic kidney disease stage III, Hypothyroidism, depression, and esophageal cancer status post resection in 2006 presented to the ED with chest pain of more than one week's duration. Patient noted to initially be at baseline Cr. Renal function seemed to gradually worsen over the course of the admission despite avoidance of nephrotoxic medications and IV fluids.  CREAT  Date/Time Value Ref Range Status  02/21/2015 10:04 AM 2.77* 0.70 - 1.18 mg/dL Final   CREATININE, SER  Date/Time Value Ref Range Status  08/05/2015 06:42 AM 3.71* 0.61 - 1.24 mg/dL Final  08/04/2015 06:08 AM 3.38* 0.61 - 1.24 mg/dL Final  08/03/2015 05:03 AM 3.28* 0.61 - 1.24 mg/dL Final  08/02/2015 03:31 AM 3.02* 0.61 - 1.24 mg/dL Final  08/01/2015 04:20 PM 2.88* 0.61 - 1.24 mg/dL Final  07/02/2015 01:45 PM 2.81* 0.40 - 1.50 mg/dL Final  06/20/2015 12:33 PM 2.97* 0.40 - 1.50 mg/dL Final  06/04/2015 01:26 PM 2.85* 0.40 - 1.50 mg/dL Final  01/25/2015 11:33 AM 2.50* 0.40 - 1.50 mg/dL Final  12/04/2014 01:53 PM 2.73* 0.40 - 1.50 mg/dL Final  10/30/2014 10:56 AM 2.32* 0.40 - 1.50 mg/dL Final  09/27/2014 10:05 AM 2.22* 0.40 - 1.50 mg/dL Final  08/30/2014 10:00 AM 2.19* 0.40 - 1.50 mg/dL Final     PMHx:   Past Medical History  Diagnosis Date  . COPD (chronic obstructive pulmonary disease) ([place])   . CHF (congestive heart failure) ([place])   . Thyroid disease   . Chronic bronchitis ([place])   . Atrial fibrillation ([place])   . CKD (chronic kidney disease)   . Diabetes mellitus without complication ([place])   . Allergy   . Esophageal cancer ([facility])     esophogeal   . CKD (chronic kidney disease)   . Depression   . Hypertension   . Colon polyps   . CAD (coronary artery  disease)   . Hyperlipidemia   . GERD (gastroesophageal reflux disease)   . History of renal stone   . Stroke [facility])     ? TIA per pt  . Shortness of breath dyspnea     with exertion  . Pneumonia   . Dyslexia   . Enlarged prostate   . Headache     hx of - none in years  . Anemia   . Loose stools   . Flatulence/gas pain/belching     Past Surgical History  Procedure Laterality Date  . Esophagus surgery  2006  . Spine surgery      bone spurs  . Cardiac catheterization  2009    two stents placed  . Tonsillectomy    . Eye surgery Bilateral     cataract surgery with lens implants  . Colonoscopy    . Esophagogastroduodenoscopy (egd) with propofol N/A 05/16/2015    Procedure: ESOPHAGOGASTRODUODENOSCOPY (EGD) WITH PROPOFOL;  Surgeon: Mauri Pole, MD;  Location: [facility];  Service: Endoscopy;  Laterality: N/A;    Family Hx:  Family History  Problem Relation Age of Onset  . COPD Father   . Diabetes Neg Hx     Social History:  reports that he quit smoking about 14 years ago. His smoking use included Cigarettes. He has a 42 pack-year smoking history. He has never used smokeless tobacco. He reports that he does not  drink alcohol or use illicit drugs.  Allergies:  Allergies  Allergen Reactions  . Dust Mite Extract Other (See Comments)    Runny nose   . Pollen Extract Other (See Comments)    Runny nose    Medications: Prior to Admission medications   Medication Sig Start Date End Date Taking? Authorizing Provider  amiodarone (PACERONE) 200 MG tablet Take 1 tablet (200 mg total) by mouth 2 (two) times daily. 07/17/15  Yes Dorothy Spark, MD  apixaban (ELIQUIS) 5 MG TABS tablet Take 1 tablet (5 mg total) by mouth 2 (two) times daily. 07/17/15  Yes Dorothy Spark, MD  atorvastatin (LIPITOR) 80 MG tablet Take 1 tablet (80 mg total) by mouth daily. 08/31/14  Yes Elayne Snare, MD  azelastine (ASTELIN) 0.1 % nasal spray Place 2 sprays into both nostrils at bedtime as needed  for rhinitis. Use in each nostril as directed 07/26/15  Yes Colon Branch, MD  buPROPion (WELLBUTRIN XL) 300 MG 24 hr tablet TAKE 1 TABLET(300 MG) BY MOUTH DAILY 07/23/15  Yes Brunetta Jeans, PA-C  doxercalciferol (HECTOROL) 2.5 MCG capsule TABLET 1 CAPSULE BY MOUTH DAILY 03/16/15  Yes Brunetta Jeans, PA-C  DULoxetine (CYMBALTA) 60 MG capsule Take 1 capsule (60 mg total) by mouth daily. 08/14/14  Yes Brunetta Jeans, PA-C  ergocalciferol (VITAMIN D2) 50000 units capsule Take 1 capsule (50,000 Units total) by mouth every 30 (thirty) days. 07/02/15  Yes Brunetta Jeans, PA-C  fluticasone (FLONASE) 50 MCG/ACT nasal spray Place 2 sprays into both nostrils daily. 07/12/15  Yes Edward Saguier, PA-C  Fluticasone Furoate-Vilanterol (BREO ELLIPTA IN) Inhale 1 puff into the lungs daily.    Yes Historical Provider, MD  furosemide (LASIX) 40 MG tablet Take 40-80 mg by mouth daily. Take 6m when the fluid starts if continues Patient states he will take another pill   Yes Historical Provider, MD  gabapentin (NEURONTIN) 300 MG capsule TAKE 2 CAPSULES BY MOUTH THREE TIMES DAILY 10/06/14  Yes WBrunetta Jeans PA-C  Insulin Glargine (TOUJEO SOLOSTAR) 300 UNIT/ML SOPN Inject 60 Units into the skin every morning. 03/14/15  Yes AElayne Snare MD  insulin lispro (HUMALOG KWIKPEN) 100 UNIT/ML KiwkPen Inject 20 units three times a day. Patient taking differently: Inject 10 Units into the skin 3 (three) times daily. Inject 20 units three times a day. 05/04/15  Yes AElayne Snare MD  ipratropium-albuterol (DUONEB) 0.5-2.5 (3) MG/3ML SOLN Take 3 mLs by nebulization every 6 (six) hours as needed. Patient taking differently: Take 3 mLs by nebulization every 6 (six) hours as needed. Short 05/29/15  Yes WBrunetta Jeans PA-C  ketoconazole (NIZORAL) 2 % shampoo Apply 1 application topically 2 (two) times a week. 01/02/15  Yes WBrunetta Jeans PA-C  levothyroxine (SYNTHROID, LEVOTHROID) 112 MCG tablet Take 112 mcg by mouth daily before breakfast.    Yes Historical Provider, MD  montelukast (SINGULAIR) 10 MG tablet Take 10 mg by mouth at bedtime.   Yes Historical Provider, MD  nebivolol (BYSTOLIC) 10 MG tablet Take 10 mg by mouth daily.   Yes Historical Provider, MD  nystatin (MYCOSTATIN) 100000 UNIT/ML suspension Take 5 mLs (500,000 Units total) by mouth 4 (four) times daily. 05/18/15  Yes KMauri Pole MD  olopatadine (PATANOL) 0.1 % ophthalmic solution Place 1 drop into both eyes 2 (two) times daily as needed. 05/29/15  Yes WBrunetta Jeans PA-C  ondansetron (ZOFRAN) 4 MG tablet Take 1 tablet (4 mg total) by mouth every 8 (eight) hours  as needed for nausea or vomiting. 05/07/15  Yes Mauri Pole, MD  ranolazine (RANEXA) 500 MG 12 hr tablet Take 1 tablet (500 mg total) by mouth 2 (two) times daily. 07/17/15  Yes Dorothy Spark, MD  roflumilast (DALIRESP) 500 MCG TABS tablet Take 500 mcg by mouth daily.   Yes Historical Provider, MD  spironolactone (ALDACTONE) 25 MG tablet Take 25 mg by mouth daily.   Yes Historical Provider, MD  Testosterone (ANDROGEL) 20.25 MG/1.25GM (1.62%) GEL Apply 3 pumps on each arm daily (6) pumps total 03/08/15  Yes Elayne Snare, MD  tiotropium (SPIRIVA) 18 MCG inhalation capsule Place 18 mcg into inhaler and inhale daily.   Yes Historical Provider, MD  ACCU-CHEK AVIVA PLUS test strip TEST AS DIRECTED THREE TIMES DAILY 10/19/14   Brunetta Jeans, PA-C  diltiazem (CARDIZEM CD) 120 MG 24 hr capsule Take 1 capsule (120 mg total) by mouth daily. Patient not taking: Reported on 08/01/2015 03/22/15   Dorothy Spark, MD  Insulin Syringe-Needle U-100 [facility] INSULIN SYRINGE) 31G X 5/16" 0.3 ML MISC Use as directed 12/14/14   Elayne Snare, MD  OXYGEN Inhale into the lungs. 2L all day and night    Historical Provider, MD    I have reviewed the patient's current medications.  Labs:  Results for orders placed or performed during the hospital encounter of 08/01/15 (from the past 48 hour(s))  Urinalysis, Routine w  reflex microscopic (not at [facility])     Status: Abnormal   Collection Time: 08/03/15  6:01 PM  Result Value Ref Range   Color, Urine YELLOW YELLOW   APPearance CLOUDY (A) CLEAR   Specific Gravity, Urine 1.023 1.005 - 1.030   pH 5.0 5.0 - 8.0   Glucose, UA NEGATIVE NEGATIVE mg/dL   Hgb urine dipstick NEGATIVE NEGATIVE   Bilirubin Urine NEGATIVE NEGATIVE   Ketones, ur NEGATIVE NEGATIVE mg/dL   Protein, ur NEGATIVE NEGATIVE mg/dL   Nitrite NEGATIVE NEGATIVE   Leukocytes, UA TRACE (A) NEGATIVE  Urine culture     Status: Abnormal   Collection Time: 08/03/15  6:01 PM  Result Value Ref Range   Specimen Description URINE, CATHETERIZED    Special Requests NONE    Culture (A)     >=100,000 COLONIES/mL DIPHTHEROIDS(CORYNEBACTERIUM SPECIES) Standardized susceptibility testing for this organism is not available.    Report Status 08/05/2015 FINAL   Urine microscopic-add on     Status: Abnormal   Collection Time: 08/03/15  6:01 PM  Result Value Ref Range   Squamous Epithelial / LPF 0-5 (A) NONE SEEN   WBC, UA 0-5 0 - 5 WBC/hpf   RBC / HPF 0-5 0 - 5 RBC/hpf   Bacteria, UA MANY (A) NONE SEEN   Casts HYALINE CASTS (A) NEGATIVE   Urine-Other MUCOUS PRESENT   Glucose, capillary     Status: Abnormal   Collection Time: 08/03/15 10:03 PM  Result Value Ref Range   Glucose-Capillary 144 (H) 65 - 99 mg/dL   Comment 1 Notify RN    Comment 2 Document in Chart   CBC     Status: Abnormal   Collection Time: 08/04/15  6:08 AM  Result Value Ref Range   WBC 16.7 (H) 4.0 - 10.5 K/uL   RBC 3.29 (L) 4.22 - 5.81 MIL/uL   Hemoglobin 9.5 (L) 13.0 - 17.0 g/dL   HCT 30.2 (L) 39.0 - 52.0 %   MCV 91.8 78.0 - 100.0 fL   MCH 28.9 26.0 - 34.0 pg  MCHC 31.5 30.0 - 36.0 g/dL   RDW 14.0 11.5 - 15.5 %   Platelets 279 150 - 400 K/uL  Basic metabolic panel     Status: Abnormal   Collection Time: 08/04/15  6:08 AM  Result Value Ref Range   Sodium 134 (L) 135 - 145 mmol/L   Potassium 4.7 3.5 - 5.1 mmol/L   Chloride  106 101 - 111 mmol/L   CO2 17 (L) 22 - 32 mmol/L   Glucose, Bld 151 (H) 65 - 99 mg/dL   BUN 53 (H) 6 - 20 mg/dL   Creatinine, Ser 3.38 (H) 0.61 - 1.24 mg/dL   Calcium 8.3 (L) 8.9 - 10.3 mg/dL   GFR calc non Af Amer 16 (L) >60 mL/min   GFR calc Af Amer 19 (L) >60 mL/min    Comment: (NOTE) The eGFR has been calculated using the CKD EPI equation. This calculation has not been validated in all clinical situations. eGFR's persistently <60 mL/min signify possible Chronic Kidney Disease.    Anion gap 11 5 - 15  Glucose, capillary     Status: Abnormal   Collection Time: 08/04/15  7:54 AM  Result Value Ref Range   Glucose-Capillary 144 (H) 65 - 99 mg/dL  Glucose, capillary     Status: Abnormal   Collection Time: 08/04/15 11:28 AM  Result Value Ref Range   Glucose-Capillary 137 (H) 65 - 99 mg/dL  Glucose, capillary     Status: Abnormal   Collection Time: 08/04/15  4:43 PM  Result Value Ref Range   Glucose-Capillary 121 (H) 65 - 99 mg/dL  Glucose, capillary     Status: Abnormal   Collection Time: 08/04/15  9:43 PM  Result Value Ref Range   Glucose-Capillary 117 (H) 65 - 99 mg/dL  Procalcitonin     Status: None   Collection Time: 08/05/15  6:42 AM  Result Value Ref Range   Procalcitonin 0.49 ng/mL    Comment:        Interpretation: PCT (Procalcitonin) <= 0.5 ng/mL: Systemic infection (sepsis) is not likely. Local bacterial infection is possible. (NOTE)         ICU PCT Algorithm               Non ICU PCT Algorithm    ----------------------------     ------------------------------         PCT < 0.25 ng/mL                 PCT < 0.1 ng/mL     Stopping of antibiotics            Stopping of antibiotics       strongly encouraged.               strongly encouraged.    ----------------------------     ------------------------------       PCT level decrease by               PCT < 0.25 ng/mL       >= 80% from peak PCT       OR PCT 0.25 - 0.5 ng/mL          Stopping of antibiotics                                              encouraged.     Stopping of antibiotics  encouraged.    ----------------------------     ------------------------------       PCT level decrease by              PCT >= 0.25 ng/mL       < 80% from peak PCT        AND PCT >= 0.5 ng/mL            Continuin g antibiotics                                              encouraged.       Continuing antibiotics            encouraged.    ----------------------------     ------------------------------     PCT level increase compared          PCT > 0.5 ng/mL         with peak PCT AND          PCT >= 0.5 ng/mL             Escalation of antibiotics                                          strongly encouraged.      Escalation of antibiotics        strongly encouraged.   CBC     Status: Abnormal   Collection Time: 08/05/15  6:42 AM  Result Value Ref Range   WBC 15.9 (H) 4.0 - 10.5 K/uL   RBC 3.01 (L) 4.22 - 5.81 MIL/uL   Hemoglobin 8.9 (L) 13.0 - 17.0 g/dL   HCT 27.6 (L) 39.0 - 52.0 %   MCV 91.7 78.0 - 100.0 fL   MCH 29.6 26.0 - 34.0 pg   MCHC 32.2 30.0 - 36.0 g/dL   RDW 14.3 11.5 - 15.5 %   Platelets 310 150 - 400 K/uL  Basic metabolic panel     Status: Abnormal   Collection Time: 08/05/15  6:42 AM  Result Value Ref Range   Sodium 131 (L) 135 - 145 mmol/L   Potassium 5.2 (H) 3.5 - 5.1 mmol/L   Chloride 103 101 - 111 mmol/L   CO2 18 (L) 22 - 32 mmol/L   Glucose, Bld 124 (H) 65 - 99 mg/dL   BUN 60 (H) 6 - 20 mg/dL   Creatinine, Ser 3.71 (H) 0.61 - 1.24 mg/dL   Calcium 8.2 (L) 8.9 - 10.3 mg/dL   GFR calc non Af Amer 14 (L) >60 mL/min   GFR calc Af Amer 17 (L) >60 mL/min    Comment: (NOTE) The eGFR has been calculated using the CKD EPI equation. This calculation has not been validated in all clinical situations. eGFR's persistently <60 mL/min signify possible Chronic Kidney Disease.    Anion gap 10 5 - 15  Glucose, capillary     Status: None   Collection Time: 08/05/15  7:25 AM  Result Value  Ref Range   Glucose-Capillary 99 65 - 99 mg/dL  Glucose, capillary     Status: Abnormal   Collection Time: 08/05/15 11:26 AM  Result Value Ref Range   Glucose-Capillary 128 (H) 65 - 99 mg/dL     ROS: A comprehensive review of systems was  negative.  Physical Exam: Filed Vitals:   08/05/15 0453 08/05/15 1338  BP: 108/52 108/52  Pulse: 70   Temp: 97.8 F (36.6 C) 97.7 F (36.5 C)  Resp: 15 18     General -- oriented x3, pleasant and cooperative. HEENT -- Head is normocephalic. PERRLA. EOMI. Ears, nose and throat were benign. Pallor. MMM. Neck -- supple; no bruits. Integument -- intact. No rash or erythema noted Chest -- poor expansion, but lungs relatively clear to auscultation. Cardiac -- RRR. 3/6 murmur noted  Abdomen -- soft, nontender. No masses palpable. Bowel sounds present. CNS -- cranial nerves II through XII grossly intact. Sensation intact throughout Extremeties - no tenderness or effusions noted. No edema. Dorsalis pedis pulses present and symmetrical.     Assessment/Plan: 1. Acute on CKD stage III, hyponatremia/ urinary retention: Chronic disease thought to be 2/2 HTN and previous obstruction from nephrolith. Cr 2.88 on admission (which is baseline). Multiple episodes of hypotension noted during admission. Original UA notes yellow/cloudy urine -- today is tea colored. UA w/o protein or blood. No ACEi/ARB/NSAID on med history. Patient is on statin, cannot r/o Rhabdo. High suspicion for ischemic ATN 2/2 hypotension.  - Cr 3.71 >> continues to climb; K 5.2, Na 131, Bicarb 18; UOP 450cc/24hr - Will obtain FENa, CK, urine eosinophils, and Orthostatic VS. - Also getting Mag level as patient has milk of magnesia in orders (DC milk of mag) - Gentle hydration: NS @ 85m/hr - Foley in place. Renal UKoreaunremarkable  - Avoiding nephrotoxins where possible  - Continue to monitor >> Renal panel in AM  2. Hypotension:  multiple BP values w/ systolic BP <<92 Likely cause of AKI.   - Hold antihypertensives.  - Gentle rehydration; careful not to overhydrate w/ h/o CHF and AS.  3. Anemia:  Patient appears to have baseline Hgb around 11-12.  - Hgb stable over the past couple day, but below suspected baseline. Currently Hgb 8.9 - Consider Iron Studies and FOBT if continues to downtrend.  - Consider holding Eliquis if drops too low 4. Chest Pain: EKG w/o ischemia. Suspicion for MSK pain, per primary 5. COPD: Per primary. 6. Diastolic CHF and A Fib: Watching weights; on spironolactone and lasix as OP; BPs not likely to tolerate home meds. CHADS-VASc = 4 - monitor weights and BP - On Eliquis; would consider holding if Hgb downtrend continues.  - Management per primary.   IGeorges Lynch4/30/2017, 4:55 PM   I have seen and examined this patient and agree with plan as outlined by Dr. MAlease Frame  Mr. LMassiehas had a long history of CKD stage 4 due to DM and HTN ([facility] [place]in Mendat [place] practice[place]  His baseline Scr has ranged from 2.7-3.1 but has had several bouts of pneumonia and now presents with SSCP and hypotension.  He was on aldactone and lasix prior to admission and has had ABLA all likely contributors to his AKI/CKD presumably due to ischemic ATN.  Will start gentle hydration with IVF's and follow UOP and daily Scr.  He is currently not far from his baseline and should improve with IVF's and improved BP's.  No indication for HD at this time and hyperkalemia will likely improve with IVF's (improved distal Na delivery).  He may also benefit from bicarbonate therapy as well and will start some po bicarb. JGovernor RooksColadonato,MD 08/05/2015 10:44 PM

## 2015-08-05 NOTE — Progress Notes (Signed)
After a pressure dressing was applied the patient did stop bleeding after about 30 minutes. Pt has decided that he does need home PT, if he goes home

## 2015-08-05 NOTE — Progress Notes (Signed)
RN rounding on pt, and noticed patient had blood all over bed and arm. Pt was asleep. Both IVs were noted to be out. RN woke pt, bathed pt, and pt is now resting in the chair. Pt is very usteady on his feet, RN encouraged pt to work with PT. RN does not feel that pt would be safe at home alone. Etta Quill, RN

## 2015-08-05 NOTE — Progress Notes (Signed)
Patient ID: Ricky Baker, male   DOB: February 20, 1936, 80 y.o.   MRN: [id number]    PROGRESS NOTE    Ricky Baker  [id number] DOB: [birth year] DOA: 08/01/2015  PCP: Leeanne Rio, PA-C   Outpatient Specialists:   Brief Narrative:  80 y.o. male with paroxysmal atrial fibrillation on Eliquis, chronic diastolic CHF, COPD with home O2 requirement, chronic kidney disease stage III, Hypothyroidism, depression, and esophageal cancer status post resection in 2006 presented to the ED at [facility] [place] with chest pain of more than one week's duration.patient, PCP on 07/26/2015, diagnosed him with pneumonia based on chest x-ray, and treated with a course of Augmentin and prednisone.   ED Course: Upon arrival to the ED, patient was found to be afebrile, saturating well on 2 L/m, and with vital signs stable. Chest x-ray demonstrates resolution of the prior infiltrate, CBC with leukocytosis 21,400 and hemoglobin of 10.2.  Assessment & Plan:  1. Chest pain  - Initial troponin 0.06; EKG without acute ischemic features  - ASA 324 mg given on arrival, Lipitor given - Suspect this is MSK pain secondary to recent PNA and associated coughing fits   - per cardiology, no need for further cardiac work up at this time - outpatient follow up with Dr. Meda Coffee   2. COPD  - Requiring supplemental O2 around-the-clock at baseline - saturating well on his usual 2 Lpm - Continue DuoNeb prn, Singulair QD   3. Chronic diastolic CHF  - weight is up from 200 --> 208 --> 210 lbs, placed on Lasix 40 mg PO QD as per home regimen (4/28) - since Cr trending up, will stop Lasix  - daily wts, strict I/Os, fluid-restrict diet - TTE (04/04/15) with EF 0000000, grade 1 diastolic dysfunction, mild AS   4. Paroxysmal atrial fibrillation - CHADS-VASc is 56 (age x2, CHF, DM) - Continue AC with Eliquis  - Continue rate-control with amiodarone, continue holding cardizem due to soft BP - pt already on home  regimen Nebivolol but will lower the dose as BP still on lower end of normal   5. 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 Leukocytosis, anemia - Leukocytosis likely secondary to recent steroid use; completed 5-day course of prednisone on 4/25 - Hgb is 10.2 on admission, 8.9 this AM, WBC trending down  - No sign of active blood loss, may need to hold Eliquis  - CBC in AM  7. Acute on CKD stage III, hyponatremia/ hyperkalemia/ metabolic acidosis  - SCr [id number] on admission but up this AM 3.78 - Avoiding nephrotoxins where possible  - unclear why Cr up, ? Pre renal etiology from vomiting and poor oral intake, hypotension or progression of CKD - renal US unremarkable, nephrology team consulted  - BMP in AM  8. Depression  - Appears stable  - Continue current-dose Wellbutrin   9. Vomiting  - abd XRAY unremarkable - Morphine was stopped and it seems to be helping - please note CT notable with dilated esophagus full of debris concerning for distal obstruction - d/w Dr. Henrene Pastor on call, no need for EGD at this time, pt actually tolerating diet well so far and vomiting has resolved - close outpatient follow up recommended with  Dr. Pete Glatter  10. ? UTI - pt with chronic urinary retention from BPH, sees urologist - some dysuria and possibly UTI based on UA - urine culture sent for analysis and currently on Rocephin until urine cultures are back - WBC is trending down   DVT prophylaxis: on Eliquis  Code Status: Full  Family Communication: Patient at bedside  Disposition Plan: Independent living facility by 4/29  Consultants:   D/W vomiting with GI on call   Nephrology team   Procedures:   None  Antimicrobials:  Rocephin   Subjective: No nausea  and no vomiting.   Objective: Filed Vitals:   08/04/15 1958 08/05/15 0453 08/05/15 0924 08/05/15 1338  BP: 81/49 108/52  108/52  Pulse: 69 70    Temp: 98.1 F (36.7 C) 97.8 F (36.6 C)  97.7 F (36.5 C)  TempSrc: Oral Oral  Oral  Resp: 14 15  18   Height:      Weight:  96.7 kg (213 lb 3 oz)    SpO2: 97% 97% 98% 98%    Intake/Output Summary (Last 24 hours) at 08/05/15 1415 Last data filed at 08/05/15 1330  Gross per 24 hour  Intake    650 ml  Output    450 ml  Net    200 ml   Filed Weights   08/03/15 0310 08/04/15 0500 08/05/15 0453  Weight: 94.666 kg (208 lb 11.2 oz) 95.391 kg (210 lb 4.8 oz) 96.7 kg (213 lb 3 oz)    Examination:  General exam: Appears calm and comfortable  Respiratory system: Diminished breath sounds at bases, mild crackles as well at bases  Cardiovascular system: S1 & S2 heard, RRR. SEM 2/6, No JVD, rubs, gallops or clicks. No pedal edema. Gastrointestinal system: Abdomen is nondistended, soft and nontender.  Central nervous system: Alert and oriented. No focal neurological deficits. Psychiatry: Judgement and insight appear normal. Mood & affect appropriate.   Data Reviewed: I have personally reviewed following labs and imaging studies  CBC:  Recent Labs Lab 08/01/15 1620 08/02/15 0331 08/03/15 1252 08/04/15 0608 08/05/15 0642  WBC 21.4* 17.4* 18.6* 16.7* 15.9*  NEUTROABS  --  14.9*  --   --   --   HGB 10.2* 9.1* 8.4* 9.5* 8.9*  HCT 30.7* 28.9* 27.0* 30.2* 27.6*  MCV 89.8 89.8 90.9 91.8 91.7  PLT 224 195 223 279 [id number]   Basic Metabolic Panel:  Recent Labs Lab 08/01/15 1620 08/02/15 0331 08/03/15 0503 08/04/15 0608 08/05/15 0642  NA 128* 136 135 134* 131*  K 4.7 4.3 4.4 4.7 5.2*  CL 102 107 107 106 103  CO2 18* 20* 17* 17* 18*  GLUCOSE 504* 315* 159* 151* 124*  BUN 52* 49* 53* 53* 60*  CREATININE 2.88* 3.02* 3.28* 3.38* 3.71*  CALCIUM 8.0* 8.1* 8.2* 8.3* 8.2*   Liver Function Tests:  Recent Labs Lab 08/01/15 1620  AST 12*    ALT 9*  ALKPHOS 109  BILITOT 0.8  PROT 6.1*  ALBUMIN 2.7*   Cardiac Enzymes:  Recent Labs Lab 08/01/15 1620 08/01/15 2154 08/02/15 0331 08/02/15 1052  TROPONINI 0.06* 0.07* 0.06* 0.09*   BNP (last 3 results)  Recent Labs  10/30/14 1056  PROBNP 117.0*   CBG:  Recent Labs Lab 08/04/15 1128 08/04/15 1643 08/04/15 2143 08/05/15 0725 08/05/15 1126  GLUCAP 137* 121* 117* 99 128*    Radiology Studies: Dg Chest 2 View 08/01/2015   No acute chest findings.   Scheduled Meds: . amiodarone  200 mg Oral BID  .  apixaban  5 mg Oral BID  . aspirin EC  81 mg Oral Daily  . atorvastatin  80 mg Oral Daily  . buPROPion  300 mg Oral Daily  . doxercalciferol  2.5 mcg Oral Daily  . DULoxetine  60 mg Oral Daily  . fluticasone  2 spray Each Nare Daily  . gabapentin  600 mg Oral TID  . insulin aspart  0-15 Units Subcutaneous TID WC  . insulin aspart  4 Units Subcutaneous TID WC  . insulin glargine  60 Units Subcutaneous QHS  . levothyroxine  112 mcg Oral QAC breakfast  . montelukast  10 mg Oral QHS  . nebivolol  10 mg Oral Daily  . olopatadine  1 drop Both Eyes BID  . pantoprazole  40 mg Oral Daily  . ranolazine  500 mg Oral BID  . roflumilast  500 mcg Oral Daily  . testosterone  5 g Transdermal Daily  . tiotropium  18 mcg Inhalation Daily     LOS: 4 days   Time spent: 20 minutes   Faye Ramsay, MD [facility] Pager [phone]  If 7PM-7AM, please contact night-coverage [email or web address] Password [email or web address] 08/05/2015, 2:15 PM

## 2015-08-06 LAB — CBC
HEMATOCRIT: 25.6 % — AB (ref 39.0–52.0)
Hemoglobin: 8.4 g/dL — ABNORMAL LOW (ref 13.0–17.0)
MCH: 29.5 pg (ref 26.0–34.0)
MCHC: 32.8 g/dL (ref 30.0–36.0)
MCV: 89.8 fL (ref 78.0–100.0)
Platelets: 328 10*3/uL (ref 150–400)
RBC: 2.85 MIL/uL — ABNORMAL LOW (ref 4.22–5.81)
RDW: 14.1 % (ref 11.5–15.5)
WBC: 15.8 10*3/uL — ABNORMAL HIGH (ref 4.0–10.5)

## 2015-08-06 LAB — GLUCOSE, CAPILLARY
GLUCOSE-CAPILLARY: 117 mg/dL — AB (ref 65–99)
GLUCOSE-CAPILLARY: 142 mg/dL — AB (ref 65–99)
Glucose-Capillary: 265 mg/dL — ABNORMAL HIGH (ref 65–99)

## 2015-08-06 LAB — RENAL FUNCTION PANEL
Albumin: 2.1 g/dL — ABNORMAL LOW (ref 3.5–5.0)
Anion gap: 12 (ref 5–15)
BUN: 63 mg/dL — AB (ref 6–20)
CHLORIDE: 102 mmol/L (ref 101–111)
CO2: 16 mmol/L — AB (ref 22–32)
Calcium: 8.1 mg/dL — ABNORMAL LOW (ref 8.9–10.3)
Creatinine, Ser: 3.58 mg/dL — ABNORMAL HIGH (ref 0.61–1.24)
GFR calc Af Amer: 17 mL/min — ABNORMAL LOW (ref >60)
GFR calc non Af Amer: 15 mL/min — ABNORMAL LOW (ref >60)
GLUCOSE: 140 mg/dL — AB (ref 65–99)
POTASSIUM: 5 mmol/L (ref 3.5–5.1)
Phosphorus: 4.9 mg/dL — ABNORMAL HIGH (ref 2.5–4.6)
Sodium: 130 mmol/L — ABNORMAL LOW (ref 135–145)

## 2015-08-06 NOTE — Consult Note (Signed)
[facility] Renal Consultation Note  Requesting MD: [facility] Indication for Consultation: AoCKD  HPI:  Ricky Baker is a 80 y.o. male with paroxysmal atrial fibrillation, dCHF, AS, COPD with home O2 requirement, CKD III, Hypothyroidism, depression, and esophageal cancer status post resection in 2006 presented to the ED with chest pain of more than one week's duration. Patient noted to initially be at baseline Cr. Renal function progressively worsened over the course of the admission despite avoidance of nephrotoxic medications and IV fluids. Reports feeling much better today with minimal pain. Reports good urine output.  CREAT  Date/Time Value Ref Range Status  02/21/2015 10:04 AM 2.77* 0.70 - 1.18 mg/dL Final   CREATININE, SER  Date/Time Value Ref Range Status  08/06/2015 05:32 AM 3.58* 0.61 - 1.24 mg/dL Final  08/05/2015 06:42 AM 3.71* 0.61 - 1.24 mg/dL Final  08/04/2015 06:08 AM 3.38* 0.61 - 1.24 mg/dL Final  08/03/2015 05:03 AM 3.28* 0.61 - 1.24 mg/dL Final  08/02/2015 03:31 AM 3.02* 0.61 - 1.24 mg/dL Final  08/01/2015 04:20 PM 2.88* 0.61 - 1.24 mg/dL Final  07/02/2015 01:45 PM 2.81* 0.40 - 1.50 mg/dL Final  06/20/2015 12:33 PM 2.97* 0.40 - 1.50 mg/dL Final  06/04/2015 01:26 PM 2.85* 0.40 - 1.50 mg/dL Final  01/25/2015 11:33 AM 2.50* 0.40 - 1.50 mg/dL Final  12/04/2014 01:53 PM 2.73* 0.40 - 1.50 mg/dL Final  10/30/2014 10:56 AM 2.32* 0.40 - 1.50 mg/dL Final  09/27/2014 10:05 AM 2.22* 0.40 - 1.50 mg/dL Final  08/30/2014 10:00 AM 2.19* 0.40 - 1.50 mg/dL Final   PMHx:   Past Medical History  Diagnosis Date  . COPD (chronic obstructive pulmonary disease) ([place])   . CHF (congestive heart failure) ([place])   . Thyroid disease   . Chronic bronchitis ([place])   . Atrial fibrillation ([place])   . CKD (chronic kidney disease)   . Diabetes mellitus without complication ([place])   . Allergy   . Esophageal cancer ([facility])     esophogeal   . CKD (chronic kidney disease)   .  Depression   . Hypertension   . Colon polyps   . CAD (coronary artery disease)   . Hyperlipidemia   . GERD (gastroesophageal reflux disease)   . History of renal stone   . Stroke [facility])     ? TIA per pt  . Shortness of breath dyspnea     with exertion  . Pneumonia   . Dyslexia   . Enlarged prostate   . Headache     hx of - none in years  . Anemia   . Loose stools   . Flatulence/gas pain/belching     Past Surgical History  Procedure Laterality Date  . Esophagus surgery  2006  . Spine surgery      bone spurs  . Cardiac catheterization  2009    two stents placed  . Tonsillectomy    . Eye surgery Bilateral     cataract surgery with lens implants  . Colonoscopy    . Esophagogastroduodenoscopy (egd) with propofol N/A 05/16/2015    Procedure: ESOPHAGOGASTRODUODENOSCOPY (EGD) WITH PROPOFOL;  Surgeon: Mauri Pole, MD;  Location: [facility];  Service: Endoscopy;  Laterality: N/A;    Family Hx:  Family History  Problem Relation Age of Onset  . COPD Father   . Diabetes Neg Hx     Social History:  reports that he quit smoking about 14 years ago. His smoking use included Cigarettes. He has a 42 pack-year smoking history. He  has never used smokeless tobacco. He reports that he does not drink alcohol or use illicit drugs.  Allergies:  Allergies  Allergen Reactions  . Dust Mite Extract Other (See Comments)    Runny nose   . Pollen Extract Other (See Comments)    Runny nose    Medications: Prior to Admission medications   Medication Sig Start Date End Date Taking? Authorizing Provider  amiodarone (PACERONE) 200 MG tablet Take 1 tablet (200 mg total) by mouth 2 (two) times daily. 07/17/15  Yes Dorothy Spark, MD  apixaban (ELIQUIS) 5 MG TABS tablet Take 1 tablet (5 mg total) by mouth 2 (two) times daily. 07/17/15  Yes Dorothy Spark, MD  atorvastatin (LIPITOR) 80 MG tablet Take 1 tablet (80 mg total) by mouth daily. 08/31/14  Yes Elayne Snare, MD  azelastine (ASTELIN)  0.1 % nasal spray Place 2 sprays into both nostrils at bedtime as needed for rhinitis. Use in each nostril as directed 07/26/15  Yes Colon Branch, MD  buPROPion (WELLBUTRIN XL) 300 MG 24 hr tablet TAKE 1 TABLET(300 MG) BY MOUTH DAILY 07/23/15  Yes Brunetta Jeans, PA-C  doxercalciferol (HECTOROL) 2.5 MCG capsule TABLET 1 CAPSULE BY MOUTH DAILY 03/16/15  Yes Brunetta Jeans, PA-C  DULoxetine (CYMBALTA) 60 MG capsule Take 1 capsule (60 mg total) by mouth daily. 08/14/14  Yes Brunetta Jeans, PA-C  ergocalciferol (VITAMIN D2) 50000 units capsule Take 1 capsule (50,000 Units total) by mouth every 30 (thirty) days. 07/02/15  Yes Brunetta Jeans, PA-C  fluticasone (FLONASE) 50 MCG/ACT nasal spray Place 2 sprays into both nostrils daily. 07/12/15  Yes Edward Saguier, PA-C  Fluticasone Furoate-Vilanterol (BREO ELLIPTA IN) Inhale 1 puff into the lungs daily.    Yes Historical Provider, MD  furosemide (LASIX) 40 MG tablet Take 40-80 mg by mouth daily. Take 68m when the fluid starts if continues Patient states he will take another pill   Yes Historical Provider, MD  gabapentin (NEURONTIN) 300 MG capsule TAKE 2 CAPSULES BY MOUTH THREE TIMES DAILY 10/06/14  Yes WBrunetta Jeans PA-C  Insulin Glargine (TOUJEO SOLOSTAR) 300 UNIT/ML SOPN Inject 60 Units into the skin every morning. 03/14/15  Yes AElayne Snare MD  insulin lispro (HUMALOG KWIKPEN) 100 UNIT/ML KiwkPen Inject 20 units three times a day. Patient taking differently: Inject 10 Units into the skin 3 (three) times daily. Inject 20 units three times a day. 05/04/15  Yes AElayne Snare MD  ipratropium-albuterol (DUONEB) 0.5-2.5 (3) MG/3ML SOLN Take 3 mLs by nebulization every 6 (six) hours as needed. Patient taking differently: Take 3 mLs by nebulization every 6 (six) hours as needed. Short 05/29/15  Yes WBrunetta Jeans PA-C  ketoconazole (NIZORAL) 2 % shampoo Apply 1 application topically 2 (two) times a week. 01/02/15  Yes WBrunetta Jeans PA-C  levothyroxine (SYNTHROID,  LEVOTHROID) 112 MCG tablet Take 112 mcg by mouth daily before breakfast.   Yes Historical Provider, MD  montelukast (SINGULAIR) 10 MG tablet Take 10 mg by mouth at bedtime.   Yes Historical Provider, MD  nebivolol (BYSTOLIC) 10 MG tablet Take 10 mg by mouth daily.   Yes Historical Provider, MD  nystatin (MYCOSTATIN) 100000 UNIT/ML suspension Take 5 mLs (500,000 Units total) by mouth 4 (four) times daily. 05/18/15  Yes KMauri Pole MD  olopatadine (PATANOL) 0.1 % ophthalmic solution Place 1 drop into both eyes 2 (two) times daily as needed. 05/29/15  Yes WBrunetta Jeans PA-C  ondansetron (ZOFRAN) 4 MG tablet Take  1 tablet (4 mg total) by mouth every 8 (eight) hours as needed for nausea or vomiting. 05/07/15  Yes Mauri Pole, MD  ranolazine (RANEXA) 500 MG 12 hr tablet Take 1 tablet (500 mg total) by mouth 2 (two) times daily. 07/17/15  Yes Dorothy Spark, MD  roflumilast (DALIRESP) 500 MCG TABS tablet Take 500 mcg by mouth daily.   Yes Historical Provider, MD  spironolactone (ALDACTONE) 25 MG tablet Take 25 mg by mouth daily.   Yes Historical Provider, MD  Testosterone (ANDROGEL) 20.25 MG/1.25GM (1.62%) GEL Apply 3 pumps on each arm daily (6) pumps total 03/08/15  Yes Elayne Snare, MD  tiotropium (SPIRIVA) 18 MCG inhalation capsule Place 18 mcg into inhaler and inhale daily.   Yes Historical Provider, MD  ACCU-CHEK AVIVA PLUS test strip TEST AS DIRECTED THREE TIMES DAILY 10/19/14   Brunetta Jeans, PA-C  diltiazem (CARDIZEM CD) 120 MG 24 hr capsule Take 1 capsule (120 mg total) by mouth daily. Patient not taking: Reported on 08/01/2015 03/22/15   Dorothy Spark, MD  Insulin Syringe-Needle U-100 [facility] INSULIN SYRINGE) 31G X 5/16" 0.3 ML MISC Use as directed 12/14/14   Elayne Snare, MD  OXYGEN Inhale into the lungs. 2L all day and night    Historical Provider, MD    I have reviewed the patient's current medications.  Labs:  Results for orders placed or performed during the hospital  encounter of 08/01/15 (from the past 48 hour(s))  Glucose, capillary     Status: Abnormal   Collection Time: 08/04/15 11:28 AM  Result Value Ref Range   Glucose-Capillary 137 (H) 65 - 99 mg/dL  Glucose, capillary     Status: Abnormal   Collection Time: 08/04/15  4:43 PM  Result Value Ref Range   Glucose-Capillary 121 (H) 65 - 99 mg/dL  Glucose, capillary     Status: Abnormal   Collection Time: 08/04/15  9:43 PM  Result Value Ref Range   Glucose-Capillary 117 (H) 65 - 99 mg/dL  Procalcitonin     Status: None   Collection Time: 08/05/15  6:42 AM  Result Value Ref Range   Procalcitonin 0.49 ng/mL    Comment:        Interpretation: PCT (Procalcitonin) <= 0.5 ng/mL: Systemic infection (sepsis) is not likely. Local bacterial infection is possible. (NOTE)         ICU PCT Algorithm               Non ICU PCT Algorithm    ----------------------------     ------------------------------         PCT < 0.25 ng/mL                 PCT < 0.1 ng/mL     Stopping of antibiotics            Stopping of antibiotics       strongly encouraged.               strongly encouraged.    ----------------------------     ------------------------------       PCT level decrease by               PCT < 0.25 ng/mL       >= 80% from peak PCT       OR PCT 0.25 - 0.5 ng/mL          Stopping of antibiotics  encouraged.     Stopping of antibiotics           encouraged.    ----------------------------     ------------------------------       PCT level decrease by              PCT >= 0.25 ng/mL       < 80% from peak PCT        AND PCT >= 0.5 ng/mL            Continuin g antibiotics                                              encouraged.       Continuing antibiotics            encouraged.    ----------------------------     ------------------------------     PCT level increase compared          PCT > 0.5 ng/mL         with peak PCT AND          PCT >= 0.5 ng/mL              Escalation of antibiotics                                          strongly encouraged.      Escalation of antibiotics        strongly encouraged.   CBC     Status: Abnormal   Collection Time: 08/05/15  6:42 AM  Result Value Ref Range   WBC 15.9 (H) 4.0 - 10.5 K/uL   RBC 3.01 (L) 4.22 - 5.81 MIL/uL   Hemoglobin 8.9 (L) 13.0 - 17.0 g/dL   HCT 27.6 (L) 39.0 - 52.0 %   MCV 91.7 78.0 - 100.0 fL   MCH 29.6 26.0 - 34.0 pg   MCHC 32.2 30.0 - 36.0 g/dL   RDW 14.3 11.5 - 15.5 %   Platelets 310 150 - 400 K/uL  Basic metabolic panel     Status: Abnormal   Collection Time: 08/05/15  6:42 AM  Result Value Ref Range   Sodium 131 (L) 135 - 145 mmol/L   Potassium 5.2 (H) 3.5 - 5.1 mmol/L   Chloride 103 101 - 111 mmol/L   CO2 18 (L) 22 - 32 mmol/L   Glucose, Bld 124 (H) 65 - 99 mg/dL   BUN 60 (H) 6 - 20 mg/dL   Creatinine, Ser 3.71 (H) 0.61 - 1.24 mg/dL   Calcium 8.2 (L) 8.9 - 10.3 mg/dL   GFR calc non Af Amer 14 (L) >60 mL/min   GFR calc Af Amer 17 (L) >60 mL/min    Comment: (NOTE) The eGFR has been calculated using the CKD EPI equation. This calculation has not been validated in all clinical situations. eGFR's persistently <60 mL/min signify possible Chronic Kidney Disease.    Anion gap 10 5 - 15  Glucose, capillary     Status: None   Collection Time: 08/05/15  7:25 AM  Result Value Ref Range   Glucose-Capillary 99 65 - 99 mg/dL  Glucose, capillary     Status: Abnormal   Collection Time: 08/05/15 11:26 AM  Result Value Ref Range  Glucose-Capillary 128 (H) 65 - 99 mg/dL  CK     Status: Abnormal   Collection Time: 08/05/15  4:56 PM  Result Value Ref Range   Total CK 449 (H) 49 - 397 U/L  Magnesium     Status: None   Collection Time: 08/05/15  4:56 PM  Result Value Ref Range   Magnesium 1.9 1.7 - 2.4 mg/dL  Sodium, urine, random     Status: None   Collection Time: 08/05/15  5:12 PM  Result Value Ref Range   Sodium, Ur <10 mmol/L  Creatinine, urine, random     Status: None    Collection Time: 08/05/15  5:12 PM  Result Value Ref Range   Creatinine, Urine 57.49 mg/dL  Glucose, capillary     Status: Abnormal   Collection Time: 08/05/15  5:14 PM  Result Value Ref Range   Glucose-Capillary 133 (H) 65 - 99 mg/dL  Glucose, capillary     Status: Abnormal   Collection Time: 08/05/15  9:21 PM  Result Value Ref Range   Glucose-Capillary 179 (H) 65 - 99 mg/dL  CBC     Status: Abnormal   Collection Time: 08/06/15  5:32 AM  Result Value Ref Range   WBC 15.8 (H) 4.0 - 10.5 K/uL   RBC 2.85 (L) 4.22 - 5.81 MIL/uL   Hemoglobin 8.4 (L) 13.0 - 17.0 g/dL   HCT 25.6 (L) 39.0 - 52.0 %   MCV 89.8 78.0 - 100.0 fL   MCH 29.5 26.0 - 34.0 pg   MCHC 32.8 30.0 - 36.0 g/dL   RDW 14.1 11.5 - 15.5 %   Platelets 328 150 - 400 K/uL  Renal function panel     Status: Abnormal   Collection Time: 08/06/15  5:32 AM  Result Value Ref Range   Sodium 130 (L) 135 - 145 mmol/L   Potassium 5.0 3.5 - 5.1 mmol/L   Chloride 102 101 - 111 mmol/L   CO2 16 (L) 22 - 32 mmol/L   Glucose, Bld 140 (H) 65 - 99 mg/dL   BUN 63 (H) 6 - 20 mg/dL   Creatinine, Ser 3.58 (H) 0.61 - 1.24 mg/dL   Calcium 8.1 (L) 8.9 - 10.3 mg/dL   Phosphorus 4.9 (H) 2.5 - 4.6 mg/dL   Albumin 2.1 (L) 3.5 - 5.0 g/dL   GFR calc non Af Amer 15 (L) >60 mL/min   GFR calc Af Amer 17 (L) >60 mL/min    Comment: (NOTE) The eGFR has been calculated using the CKD EPI equation. This calculation has not been validated in all clinical situations. eGFR's persistently <60 mL/min signify possible Chronic Kidney Disease.    Anion gap 12 5 - 15  Glucose, capillary     Status: Abnormal   Collection Time: 08/06/15  7:23 AM  Result Value Ref Range   Glucose-Capillary 117 (H) 65 - 99 mg/dL   Comment 1 Notify RN    Comment 2 Document in Chart      ROS: A comprehensive review of systems was negative.  Physical Exam: Filed Vitals:   08/06/15 0513 08/06/15 0830  BP: 113/64 125/67  Pulse:  69  Temp: 97.5 F (36.4 C) 97.4 F (36.3 C)   Resp: 16 20     General -- oriented x3, pleasant and cooperative. HEENT -- Head is normocephalic. MMM. Neck -- supple; no bruits. Integument -- intact. No rash or erythema noted Chest -- poor expansion, crackles noted at bases Cardiac -- RRR. 3/6 murmur noted  Abdomen --  soft, nontender. No masses palpable. Bowel sounds present. Extremeties - no tenderness or effusions noted. No edema. Dorsalis pedis pulses present and symmetrical.   Assessment/Plan: 1. Acute on CKD stage III, hyponatremia/ urinary retention: Chronic disease thought to be 2/2 HTN and previous obstruction from nephrolith. Cr 2.88 on admission (baseline 2.7-3.1). Multiple episodes of hypotension noted during admission, possibly contributing to renal injury. Original UA notes yellow/cloudy urine -- today is tea colored. UA w/o protein or blood. FeNa 0.5% indicating prerenal etiology. Renal US on 4/28 with no signs of obstruction. Patient is on statin with CK elevated to 449. High suspicion for ischemic ATN 2/2 hypotension. Cr 3.71>3.58. BUN 60>63, K 5.2>5, Ph 4.9. Continue gentle fluids for elevated CK and pre-renal renal dysfunction. Continue to monitor daily renal function panels. 2. Hypotension:  multiple BP values w/ systolic BP <09. Likely cause of AKI. Normotensive overnight. Gentle rehydration; careful not to overhydrate w/ h/o CHF and AS.  3. Anemia:  Patient appears to have baseline Hgb around 11-12.  Hemoglobin 8.9>8.4. Consider iron studies, FOBT, holding Eliquis if continues to drop. 4. Chest Pain: EKG w/o ischemia. Suspicion for MSK pain, per primary 5. COPD: Per primary. 6. Diastolic CHF and A Fib: Continue to monitor weight with fluids. CHADS-VASc = 4. Management per primary team.  Dr. Junie Panning, DO, PGY-2  08/06/2015, 9:35 AM   Renal Attending: Renal fct appears to have plataeued and poss improving.  Will cont to follow and support. Nataliah Hatlestad C

## 2015-08-06 NOTE — Progress Notes (Signed)
Patient ID: Ricky Baker, male   DOB: [birth year], 80 y.o.   MRN: [id number]    PROGRESS NOTE    Ricky Baker  [id number] DOB: [birth year] DOA: 08/01/2015  PCP: Leeanne Rio, PA-C   Outpatient Specialists:   Brief Narrative:  80 y.o. male with paroxysmal atrial fibrillation on Eliquis, chronic diastolic CHF, COPD with home O2 requirement, chronic kidney disease stage III, Hypothyroidism, depression, and esophageal cancer status post resection in 2006 presented to the ED at [facility] [place] [place] with chest pain of more than one week's duration.patient, PCP on 07/26/2015, diagnosed him with pneumonia based on chest x-ray, and treated with a course of Augmentin and prednisone.   ED Course: Upon arrival to the ED, patient was found to be afebrile, saturating well on 2 L/m, and with vital signs stable. Chest x-ray demonstrates resolution of the prior infiltrate, CBC with leukocytosis 21,400 and hemoglobin of 10.2.  Assessment & Plan:  1. Chest pain  - Initial troponin 0.06; EKG without acute ischemic features  - Suspect this is MSK pain secondary to recent PNA and associated coughing fits   - resolved  - per cardiology, no need for further cardiac work up at this time - outpatient follow up with Dr. Meda Coffee   2. COPD  - Requiring supplemental O2 around-the-clock at baseline - saturating well on his usual 2 Lpm - Continue DuoNeb prn, Singulair QD   3. Chronic diastolic CHF  - weight is up from 200 --> 208 --> 210 --> 209 lbs  - lasix has been stopped as Cr was trending up - daily wts, strict I/Os, fluid-restrict diet - TTE (04/04/15) with EF 0000000, grade 1 diastolic dysfunction, mild AS   4. Paroxysmal atrial fibrillation - CHADS-VASc is 47 (age x2, CHF, DM) - Continue AC with Eliquis  - Continue rate-control with amiodarone, continue holding cardizem due to soft BP - pt already on home regimen Nebivolol, dose lowered to 2.5 mg PO QD due to lower BP   5. 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 Leukocytosis, anemia - Leukocytosis likely secondary to recent steroid use; completed 5-day course of prednisone on 4/25 - Hgb is 10.2 on admission, 8.9 this AM, WBC trending down  - No sign of active blood loss, may need to hold Eliquis  - CBC in AM  7. Acute on CKD stage III, hyponatremia/ hyperkalemia/ metabolic acidosis  - SCr [id number] on admission but up this AM 3.78 - Avoiding nephrotoxins where possible  - unclear why Cr up, ? Pre renal etiology from vomiting and poor oral intake, hypotension or progression of CKD - renal US unremarkable, nephrology team consulted, assistance is appreciated  - Cr is trending down  - BMP in AM  8. Depression  - Appears stable  - Continue current-dose Wellbutrin   9. Vomiting  - abd XRAY unremarkable - Morphine was stopped and it seems to be helping - please note CT notable with dilated esophagus full of debris concerning for distal obstruction - d/w Dr. Henrene Pastor on call, no need for EGD at this time, pt actually tolerating diet well so far and vomiting has resolved - close outpatient follow up recommended with Dr. Pete Glatter  10. ? UTI -  pt with chronic urinary retention from BPH, sees urologist - some dysuria and possibly UTI based on UA - urine culture sent for analysis and currently on Rocephin until urine cultures are back - WBC is trending down   DVT prophylaxis: on Eliquis  Code Status: Full  Family Communication: Patient at bedside  Disposition Plan: Needs SNF  Consultants:   D/W vomiting with GI on call   Nephrology team   Procedures:   None  Antimicrobials:  Rocephin   Subjective: No nausea and no vomiting.   Objective: Filed Vitals:   08/06/15 0500  08/06/15 0513 08/06/15 0830 08/06/15 1107  BP:  113/64 125/67   Pulse:   69 78  Temp:  97.5 F (36.4 C) 97.4 F (36.3 C)   TempSrc:   Oral   Resp:  16 20 18   Height:      Weight: 95.165 kg (209 lb 12.8 oz)     SpO2:  99% 96% 93%    Intake/Output Summary (Last 24 hours) at 08/06/15 1414 Last data filed at 08/06/15 1301  Gross per 24 hour  Intake  832.5 ml  Output    625 ml  Net  207.5 ml   Filed Weights   08/04/15 0500 08/05/15 0453 08/06/15 0500  Weight: 95.391 kg (210 lb 4.8 oz) 96.7 kg (213 lb 3 oz) 95.165 kg (209 lb 12.8 oz)    Examination:  General exam: Appears calm and comfortable  Respiratory system: Diminished breath sounds at bases, mild crackles as well at bases  Cardiovascular system: S1 & S2 heard, RRR. SEM 2/6, No JVD, rubs, gallops or clicks. No pedal edema. Gastrointestinal system: Abdomen is nondistended, soft and nontender.  Central nervous system: Alert and oriented. No focal neurological deficits. Psychiatry: Judgement and insight appear normal. Mood & affect appropriate.   Data Reviewed: I have personally reviewed following labs and imaging studies  CBC:  Recent Labs Lab 08/02/15 0331 08/03/15 1252 08/04/15 0608 08/05/15 0642 08/06/15 0532  WBC 17.4* 18.6* 16.7* 15.9* 15.8*  NEUTROABS 14.9*  --   --   --   --   HGB 9.1* 8.4* 9.5* 8.9* 8.4*  HCT 28.9* 27.0* 30.2* 27.6* 25.6*  MCV 89.8 90.9 91.8 91.7 89.8  PLT 195 223 279 310 [id number]   Basic Metabolic Panel:  Recent Labs Lab 08/02/15 0331 08/03/15 0503 08/04/15 0608 08/05/15 0642 08/05/15 1656 08/06/15 0532  NA 136 135 134* 131*  --  130*  K 4.3 4.4 4.7 5.2*  --  5.0  CL 107 107 106 103  --  102  CO2 20* 17* 17* 18*  --  16*  GLUCOSE 315* 159* 151* 124*  --  140*  BUN 49* 53* 53* 60*  --  63*  CREATININE 3.02* 3.28* 3.38* 3.71*  --  3.58*  CALCIUM 8.1* 8.2* 8.3* 8.2*  --  8.1*  MG  --   --   --   --  1.9  --   PHOS  --   --   --   --   --  4.9*   Liver Function Tests:  Recent  Labs Lab 08/01/15 1620 08/06/15 0532  AST 12*  --   ALT 9*  --   ALKPHOS 109  --   BILITOT 0.8  --   PROT 6.1*  --   ALBUMIN 2.7* 2.1*   Cardiac Enzymes:  Recent Labs Lab 08/01/15 1620 08/01/15 2154 08/02/15 0331 08/02/15 1052 08/05/15 1656  CKTOTAL  --   --   --   --  449*  TROPONINI 0.06* 0.07* 0.06* 0.09*  --    BNP (last 3 results)  Recent Labs  10/30/14 1056  PROBNP 117.0*   CBG:  Recent Labs Lab 08/05/15 1126 08/05/15 1714 08/05/15 2121 08/06/15 0723 08/06/15 1117  GLUCAP 128* 133* 179* 117* 142*    Radiology Studies: Dg Chest 2 View 08/01/2015   No acute chest findings.   Scheduled Meds: . amiodarone  200 mg Oral BID  . apixaban  5 mg Oral BID  . aspirin EC  81 mg Oral Daily  . atorvastatin  80 mg Oral Daily  . buPROPion  300 mg Oral Daily  . doxercalciferol  2.5 mcg Oral Daily  . DULoxetine  60 mg Oral Daily  . fluticasone  2 spray Each Nare Daily  . gabapentin  600 mg Oral TID  . insulin aspart  0-15 Units Subcutaneous TID WC  . insulin aspart  4 Units Subcutaneous TID WC  . insulin glargine  60 Units Subcutaneous QHS  . levothyroxine  112 mcg Oral QAC breakfast  . montelukast  10 mg Oral QHS  . nebivolol  10 mg Oral Daily  . olopatadine  1 drop Both Eyes BID  . pantoprazole  40 mg Oral Daily  . ranolazine  500 mg Oral BID  . roflumilast  500 mcg Oral Daily  . testosterone  5 g Transdermal Daily  . tiotropium  18 mcg Inhalation Daily     LOS: 5 days   Time spent: 20 minutes   Faye Ramsay, MD [facility] Pager [phone]  If 7PM-7AM, please contact night-coverage [email or web address] Password [email or web address] 08/06/2015, 2:14 PM

## 2015-08-06 NOTE — Progress Notes (Signed)
Physical Therapy Treatment Patient Details Name: Ricky Baker MRN: [id number] DOB: 13-Sep-1935 Today's Date: 08/06/2015    History of Present Illness Patient is a 80 yo male admitted 08/01/15 with pleuritic chest pain, possibly musculoskeletal pain from recent pna, CHF.    PMH:  PAF, CHF, COPD on home O2, CKD, Depression    PT Comments    Pt is significantly deconditioned and was unable to walk to the door without pulling up a chair behind him because his legs were giving away on him.  He requires constant, hands-on mod assist to mobilize and get up on his feet.  I do not believe he is safe to return home at this time and am recommending SNF for rehab.  PT will continue to follow acutely and help progress mobility.   Follow Up Recommendations  SNF;Supervision/Assistance - 24 hour     Equipment Recommendations  None recommended by PT    Recommendations for Other Services   NA     Precautions / Restrictions Precautions Precautions: Fall Precaution Comments: pt with significant h/o falls at home Restrictions Weight Bearing Restrictions: No    Mobility  Bed Mobility Overal bed mobility: Needs Assistance Bed Mobility: Supine to Sit     Supine to sit: Min assist     General bed mobility comments: Min hand held assit to pull to sitting EOB.  Pt using bed rail as well for leverage.   Transfers Overall transfer level: Needs assistance Equipment used: 4-wheeled walker Transfers: Sit to/from Stand Sit to Stand: Mod assist;From elevated surface         General transfer comment: Mod assist to power up to standing from various sitting surfaces (elevated bed, straight back chair, and recliner).  Pt needed mod assist and at times momentum to power up to his feet.  Uncontrolled "crash" descent to sit every time despite use of hands to help control descent.   Ambulation/Gait Ambulation/Gait assistance: Mod assist Ambulation Distance (Feet): 10 Feet Assistive device: 4-wheeled  walker Gait Pattern/deviations: Step-through pattern;Trunk flexed;Leaning posteriorly;Shuffle     General Gait Details: Pt needed mod assist to maintain balance and help support his trunk over weak and trembling legs.  He had to sit before we made it to the recliner chair because he was getting too weak and his legs were giving away.  Pt was significantly more unstable when backing up to sit as this intensified his posterior lean and he would essentially fall backwards letting the chair catch him.           Balance Overall balance assessment: Needs assistance Sitting-balance support: Feet supported;Bilateral upper extremity supported Sitting balance-Leahy Scale: Poor Sitting balance - Comments: posterior lean EOB requiring pt to hold on with both hands Postural control: Posterior lean Standing balance support: Bilateral upper extremity supported Standing balance-Leahy Scale: Poor Standing balance comment: needs both the RW and external physical support.                     Cognition Arousal/Alertness: Lethargic Behavior During Therapy: WFL for tasks assessed/performed;Anxious Overall Cognitive Status: Within Functional Limits for tasks assessed                             Pertinent Vitals/Pain Pain Assessment: No/denies pain    PT Goals (current goals can now be found in the care plan section) Acute Rehab PT Goals Patient Stated Goal: To get stronger.  To return home Progress towards PT  goals: Progressing toward goals    Frequency  Min 3X/week    PT Plan Discharge plan needs to be updated       End of Session Equipment Utilized During Treatment: Oxygen;Gait belt Activity Tolerance: Patient limited by fatigue Patient left: in chair;with call bell/phone within reach;with chair alarm set     Time: [phone] (pt talked a lot, did not charge for this time) PT Time Calculation (min) (ACUTE ONLY): 51 min  Charges:  $Gait Training: 8-22  mins $Therapeutic Activity: 8-22 mins                      Valmai Vandenberghe B. [place], [place], DPT [phone]   08/06/2015, 11:39 AM

## 2015-08-06 NOTE — NC FL2 (Signed)
[place] LEVEL OF CARE SCREENING TOOL     IDENTIFICATION  Patient Name: Ricky Baker Birthdate: [birth year] Sex: male Admission Date (Current Location): 08/01/2015  [facility] and [place] Number:  Herbalist and Address:  [facility], [place] [address], [place], [place] [address]      Provider Number: [id number]  Attending Physician Name and Address:  Theodis Blaze, MD  Relative Name and Phone Number:       Current Level of Care: Hospital Recommended Level of Care: [place] Prior Approval Number:    Date Approved/Denied:   PASRR Number:  ([id number])  Discharge Plan: SNF    Current Diagnoses: Patient Active Problem List   Diagnosis Date Noted  . Chest pain 08/03/2015  . Moderate aortic stenosis by prior echocardiogram 08/02/2015  . CAD S/P percutaneous coronary angioplasty 08/02/2015  . Chronic renal insufficiency   . Hyperglycemia 08/01/2015  . Pleuritic chest pain 08/01/2015  . Leukocytosis 07/02/2015  . Renal insufficiency 07/02/2015  . Pre-syncope 03/22/2015  . Aortic stenosis 03/12/2015  . CKD (chronic kidney disease) 03/12/2015  . Esophageal carcinoma ([place]) 03/12/2015  . Gastroenteritis 01/31/2015  . Orthopnea 10/27/2014  . Chronic respiratory failure ([place]) 09/21/2014  . Chronic right shoulder pain 09/11/2014  . Type II diabetes mellitus, uncontrolled ([place]) 08/30/2014  . Diabetic peripheral neuropathy associated with type 2 diabetes mellitus ([place]) 08/30/2014  . Essential hypertension 08/16/2014  . Systolic murmur [id number]  . PAF (paroxysmal atrial fibrillation) ([place]) 08/16/2014  . Acquired autoimmune hypothyroidism 08/15/2014  . COLD (chronic obstructive lung disease) ([place]) 08/15/2014  . Cardiac angina ([place]) 08/15/2014  . History of coronary artery disease 08/15/2014  . Major depressive disorder, recurrent episode, moderate ([place]) 08/15/2014    Orientation RESPIRATION BLADDER Height &  Weight     Self, Time, Situation, Place  O2 Continent Weight: 209 lb 12.8 oz (95.165 kg) Height:  5\' 11"  (180.3 cm)  BEHAVIORAL SYMPTOMS/MOOD NEUROLOGICAL BOWEL NUTRITION STATUS      Continent  (heart healthy/carb modified)  AMBULATORY STATUS COMMUNICATION OF NEEDS Skin   Limited Assist Verbally Normal                       Personal Care Assistance Level of Assistance  Bathing, Feeding, Dressing Bathing Assistance: Limited assistance Feeding assistance: Independent Dressing Assistance: Limited assistance     Functional Limitations Info  Sight, Hearing, Speech Sight Info: Adequate Hearing Info: Adequate Speech Info: Adequate    SPECIAL CARE FACTORS FREQUENCY  PT (By licensed PT), OT (By licensed OT)     PT Frequency:  (5x/week) OT Frequency:  (5x/week)            Contractures Contractures Info: Not present    Additional Factors Info  Insulin Sliding Scale Code Status Info:  (full code) Allergies Info:  (dust mite extract, pollen extract)   Insulin Sliding Scale Info:  (3x daily with meals)       Current Medications (08/06/2015):  This is the current hospital active medication list Current Facility-Administered Medications  Medication Dose Route Frequency Provider Last Rate Last Dose  . 0.9 %  sodium chloride infusion   Intravenous Continuous Donato Heinz, MD 50 mL/hr at 08/05/15 2200    . acetaminophen (TYLENOL) tablet 650 mg  650 mg Oral Q4H PRN Vianne Bulls, MD   650 mg at 08/05/15 1053  . amiodarone (PACERONE) tablet 200 mg  200 mg Oral BID Vianne Bulls, MD  200 mg at 08/06/15 0900  . apixaban (ELIQUIS) tablet 5 mg  5 mg Oral BID Vianne Bulls, MD   5 mg at 08/06/15 0901  . aspirin EC tablet 81 mg  81 mg Oral Daily Vianne Bulls, MD   81 mg at 08/06/15 0901  . atorvastatin (LIPITOR) tablet 80 mg  80 mg Oral Daily Ilene Qua Opyd, MD   80 mg at 08/06/15 0900  . azelastine (ASTELIN) 0.1 % nasal spray 2 spray  2 spray Each Nare QHS PRN Theodis Blaze, MD      . buPROPion (WELLBUTRIN XL) 24 hr tablet 300 mg  300 mg Oral Daily Vianne Bulls, MD   300 mg at 08/06/15 0901  . cefTRIAXone (ROCEPHIN) 1 g in dextrose 5 % 50 mL IVPB  1 g Intravenous Q24H Theodis Blaze, MD   1 g at 08/06/15 1626  . doxercalciferol (HECTOROL) capsule 2.5 mcg  2.5 mcg Oral Daily Vianne Bulls, MD   2.5 mcg at 08/06/15 0901  . DULoxetine (CYMBALTA) DR capsule 60 mg  60 mg Oral Daily Vianne Bulls, MD   60 mg at 08/06/15 0901  . fluticasone (FLONASE) 50 MCG/ACT nasal spray 2 spray  2 spray Each Nare Daily Vianne Bulls, MD   2 spray at 08/06/15 0903  . fluticasone furoate-vilanterol (BREO ELLIPTA) 100-25 MCG/INH 1 puff  1 puff Inhalation Daily Vianne Bulls, MD   1 puff at 08/06/15 0830  . gabapentin (NEURONTIN) capsule 600 mg  600 mg Oral TID Vianne Bulls, MD   600 mg at 08/06/15 1625  . insulin aspart (novoLOG) injection 0-15 Units  0-15 Units Subcutaneous TID WC Vianne Bulls, MD   8 Units at 08/06/15 1624  . ipratropium-albuterol (DUONEB) 0.5-2.5 (3) MG/3ML nebulizer solution 3 mL  3 mL Nebulization Q4H PRN Vianne Bulls, MD   3 mL at 08/05/15 0433  . levothyroxine (SYNTHROID, LEVOTHROID) tablet 112 mcg  112 mcg Oral QAC breakfast Vianne Bulls, MD   112 mcg at 08/06/15 0902  . montelukast (SINGULAIR) tablet 10 mg  10 mg Oral QHS Ilene Qua Opyd, MD   10 mg at 08/05/15 2220  . nebivolol (BYSTOLIC) tablet 2.5 mg  2.5 mg Oral Daily Theodis Blaze, MD   2.5 mg at 08/06/15 0900  . nitroGLYCERIN (NITROSTAT) SL tablet 0.4 mg  0.4 mg Sublingual Q5 Min x 3 PRN Ilene Qua Opyd, MD      . olopatadine (PATANOL) 0.1 % ophthalmic solution 1 drop  1 drop Both Eyes BID PRN Theodis Blaze, MD      . ondansetron [facility]) injection 4 mg  4 mg Intravenous Q4H PRN Theodis Blaze, MD   4 mg at 08/06/15 1840  . oxyCODONE-acetaminophen (PERCOCET/ROXICET) 5-325 MG per tablet 1-2 tablet  1-2 tablet Oral Q3H PRN Theodis Blaze, MD   2 tablet at 08/05/15 2219  . pantoprazole (PROTONIX) EC  tablet 40 mg  40 mg Oral Daily Vianne Bulls, MD   40 mg at 08/06/15 0901  . ranolazine (RANEXA) 12 hr tablet 500 mg  500 mg Oral BID Vianne Bulls, MD   500 mg at 08/06/15 0900  . roflumilast (DALIRESP) tablet 500 mcg  500 mcg Oral Daily Vianne Bulls, MD   500 mcg at 08/06/15 0902  . senna (SENOKOT) tablet 8.6 mg  1 tablet Oral BID Theodis Blaze, MD   8.6 mg at 08/06/15 0901  .  sodium bicarbonate tablet 650 mg  650 mg Oral BID Donato Heinz, MD   650 mg at 08/06/15 0902  . sodium chloride 0.9 % bolus 500 mL  500 mL Intravenous Once Lajean Saver, MD      . testosterone (ANDROGEL) 50 MG/5GM (1%) gel 5 g  5 g Transdermal Daily Vianne Bulls, MD   5 g at 08/06/15 0902  . tiotropium (SPIRIVA) inhalation capsule 18 mcg  18 mcg Inhalation Daily Vianne Bulls, MD   18 mcg at 08/06/15 1107  . [START ON 08/24/2015] Vitamin D (Ergocalciferol) (DRISDOL) capsule 50,000 Units  50,000 Units Oral Q30 days Theodis Blaze, MD         Discharge Medications: Please see discharge summary for a list of discharge medications.  Relevant Imaging Results:  Relevant Lab Results:   Additional Information    Denica Web M, LCSW

## 2015-08-06 NOTE — Clinical Social Work Note (Signed)
Clinical Social Work Assessment  Patient Details  Name: Ricky Baker MRN: [id number] Date of Birth: 1936/04/01  Date of referral:  08/06/15               Reason for consult:  Facility Placement                Permission sought to share information with:  Family Supports Permission granted to share information::  Yes, Verbal Permission Granted  Name::     Vergia Alberts  Relationship::  Sister  Contact Information:  [phone]  Housing/Transportation Living arrangements for the past 2 months:  [place] of Information:  Patient Patient Interpreter Needed:  None Criminal Activity/Legal Involvement Pertinent to Current Situation/Hospitalization:  No - Comment as needed Significant Relationships:  Siblings Lives with:  Self Do you feel safe going back to the place where you live?  Yes Need for family participation in patient care:  Yes (Comment)  Care giving concerns:  Patient sister very involved in patient care, but currently out of town.  Patient did provide verbal permission to contact his sister to further discuss discharge disposition.   Social Worker assessment / plan:  Holiday representative met with patient at bedside to offer support and discuss patient needs at discharge.  Patient states that he lives at the [facility], independently and was hopeful to return.  PT was recommending HH with 24 hour supervision, however they have now changed their recommendation to SNF.  Patient is agreeable to initiate search, but would rather return home with 24 hour private duty sitters.  CSW to start SNF search process and follow up with patient and patient sister regarding available discharge options.  CSW remains available for support and to facilitate patient discharge needs once medically stable.  Employment status:  Retired Nurse, adult PT Recommendations:  [place], [place], Home with [place] / Referral to community resources:  [place]  Patient/Family's Response to care:  Patient understanding and agreeable, but expresses concerns about the copays attached to his insurance for placement.  Patient understanding of CSW role and appreciative for involvement.  Patient/Family's Understanding of and Emotional Response to Diagnosis, Current Treatment, and Prognosis:  Patient with unrealistic expectations for current limitations, however per CM, patient sister is very in tune with patient potential needs at discharge.  Emotional Assessment Appearance:  Appears older than stated age Attitude/Demeanor/Rapport:  Lethargic (Appropriate and Engaged) Affect (typically observed):  Calm, Appropriate, Quiet Orientation:  Oriented to Self, Oriented to Place, Oriented to  Time, Oriented to Situation Alcohol / Substance use:  Not Applicable Psych involvement (Current and /or in the community):  No (Comment)  Discharge Needs  Concerns to be addressed:  Care Coordination, Discharge Planning Concerns Readmission within the last 30 days:  No Current discharge risk:  Other (Supervision) Barriers to Discharge:  Continued Medical Work up  [facility] [employer], [place]

## 2015-08-07 DIAGNOSIS — E038 Other specified hypothyroidism: Secondary | ICD-10-CM

## 2015-08-07 LAB — CBC
HCT: 24.3 % — ABNORMAL LOW (ref 39.0–52.0)
Hemoglobin: 7.8 g/dL — ABNORMAL LOW (ref 13.0–17.0)
MCH: 28.3 pg (ref 26.0–34.0)
MCHC: 32.1 g/dL (ref 30.0–36.0)
MCV: 88 fL (ref 78.0–100.0)
Platelets: 357 10*3/uL (ref 150–400)
RBC: 2.76 MIL/uL — ABNORMAL LOW (ref 4.22–5.81)
RDW: 14.1 % (ref 11.5–15.5)
WBC: 17.4 10*3/uL — ABNORMAL HIGH (ref 4.0–10.5)

## 2015-08-07 LAB — GLUCOSE, CAPILLARY
GLUCOSE-CAPILLARY: 82 mg/dL (ref 65–99)
GLUCOSE-CAPILLARY: 113 mg/dL — AB (ref 65–99)
GLUCOSE-CAPILLARY: 124 mg/dL — AB (ref 65–99)
Glucose-Capillary: 123 mg/dL — ABNORMAL HIGH (ref 65–99)
Glucose-Capillary: 159 mg/dL — ABNORMAL HIGH (ref 65–99)
Glucose-Capillary: 72 mg/dL (ref 65–99)

## 2015-08-07 LAB — RENAL FUNCTION PANEL
ALBUMIN: 2.1 g/dL — AB (ref 3.5–5.0)
Anion gap: 12 (ref 5–15)
BUN: 65 mg/dL — AB (ref 6–20)
CALCIUM: 8 mg/dL — AB (ref 8.9–10.3)
CO2: 16 mmol/L — AB (ref 22–32)
CREATININE: 3.44 mg/dL — AB (ref 0.61–1.24)
Chloride: 101 mmol/L (ref 101–111)
GFR calc Af Amer: 18 mL/min — ABNORMAL LOW (ref >60)
GFR calc non Af Amer: 16 mL/min — ABNORMAL LOW (ref >60)
GLUCOSE: 149 mg/dL — AB (ref 65–99)
PHOSPHORUS: 4.5 mg/dL (ref 2.5–4.6)
Potassium: 5.3 mmol/L — ABNORMAL HIGH (ref 3.5–5.1)
SODIUM: 129 mmol/L — AB (ref 135–145)

## 2015-08-07 NOTE — Progress Notes (Addendum)
Pt sister would like pt to go to [facility] if they have beds available. 2nd choice would be [facility]. Please call pt daughter prior to discharge. Etta Quill, RN

## 2015-08-07 NOTE — Consult Note (Signed)
[facility] Renal Consultation Note  Requesting MD: [facility] Indication for Consultation: AoCKD  HPI:  Ricky Baker is a 80 y.o. male with paroxysmal atrial fibrillation, dCHF, AS, COPD with home O2 requirement, CKD III, Hypothyroidism, depression, and esophageal cancer status post resection in 2006 presented to the ED with chest pain of more than one week's duration. Patient noted to initially be at baseline Cr. Renal function progressively worsened over the course of the admission despite avoidance of nephrotoxic medications and IV fluids. Reports worsening shortness of breath with COPD exacerbation. Denies any acute concerns.  CREAT  Date/Time Value Ref Range Status  02/21/2015 10:04 AM 2.77* 0.70 - 1.18 mg/dL Final   CREATININE, SER  Date/Time Value Ref Range Status  08/06/2015 05:32 AM 3.58* 0.61 - 1.24 mg/dL Final  08/05/2015 06:42 AM 3.71* 0.61 - 1.24 mg/dL Final  08/04/2015 06:08 AM 3.38* 0.61 - 1.24 mg/dL Final  08/03/2015 05:03 AM 3.28* 0.61 - 1.24 mg/dL Final  08/02/2015 03:31 AM 3.02* 0.61 - 1.24 mg/dL Final  08/01/2015 04:20 PM 2.88* 0.61 - 1.24 mg/dL Final  07/02/2015 01:45 PM 2.81* 0.40 - 1.50 mg/dL Final  06/20/2015 12:33 PM 2.97* 0.40 - 1.50 mg/dL Final  06/04/2015 01:26 PM 2.85* 0.40 - 1.50 mg/dL Final  01/25/2015 11:33 AM 2.50* 0.40 - 1.50 mg/dL Final  12/04/2014 01:53 PM 2.73* 0.40 - 1.50 mg/dL Final  10/30/2014 10:56 AM 2.32* 0.40 - 1.50 mg/dL Final  09/27/2014 10:05 AM 2.22* 0.40 - 1.50 mg/dL Final  08/30/2014 10:00 AM 2.19* 0.40 - 1.50 mg/dL Final   PMHx:   Past Medical History  Diagnosis Date  . COPD (chronic obstructive pulmonary disease) ([place])   . CHF (congestive heart failure) ([place])   . Thyroid disease   . Chronic bronchitis ([place])   . Atrial fibrillation ([place])   . CKD (chronic kidney disease)   . Diabetes mellitus without complication ([place])   . Allergy   . Esophageal cancer ([facility])     esophogeal   . CKD (chronic kidney disease)   .  Depression   . Hypertension   . Colon polyps   . CAD (coronary artery disease)   . Hyperlipidemia   . GERD (gastroesophageal reflux disease)   . History of renal stone   . Stroke [facility])     ? TIA per pt  . Shortness of breath dyspnea     with exertion  . Pneumonia   . Dyslexia   . Enlarged prostate   . Headache     hx of - none in years  . Anemia   . Loose stools   . Flatulence/gas pain/belching     Past Surgical History  Procedure Laterality Date  . Esophagus surgery  2006  . Spine surgery      bone spurs  . Cardiac catheterization  2009    two stents placed  . Tonsillectomy    . Eye surgery Bilateral     cataract surgery with lens implants  . Colonoscopy    . Esophagogastroduodenoscopy (egd) with propofol N/A 05/16/2015    Procedure: ESOPHAGOGASTRODUODENOSCOPY (EGD) WITH PROPOFOL;  Surgeon: Mauri Pole, MD;  Location: [facility];  Service: Endoscopy;  Laterality: N/A;    Family Hx:  Family History  Problem Relation Age of Onset  . COPD Father   . Diabetes Neg Hx     Social History:  reports that he quit smoking about 14 years ago. His smoking use included Cigarettes. He has a 42 pack-year smoking history. He  has never used smokeless tobacco. He reports that he does not drink alcohol or use illicit drugs.  Allergies:  Allergies  Allergen Reactions  . Dust Mite Extract Other (See Comments)    Runny nose   . Pollen Extract Other (See Comments)    Runny nose    Medications: Prior to Admission medications   Medication Sig Start Date End Date Taking? Authorizing Provider  amiodarone (PACERONE) 200 MG tablet Take 1 tablet (200 mg total) by mouth 2 (two) times daily. 07/17/15  Yes Dorothy Spark, MD  apixaban (ELIQUIS) 5 MG TABS tablet Take 1 tablet (5 mg total) by mouth 2 (two) times daily. 07/17/15  Yes Dorothy Spark, MD  atorvastatin (LIPITOR) 80 MG tablet Take 1 tablet (80 mg total) by mouth daily. 08/31/14  Yes Elayne Snare, MD  azelastine (ASTELIN)  0.1 % nasal spray Place 2 sprays into both nostrils at bedtime as needed for rhinitis. Use in each nostril as directed 07/26/15  Yes Colon Branch, MD  buPROPion (WELLBUTRIN XL) 300 MG 24 hr tablet TAKE 1 TABLET(300 MG) BY MOUTH DAILY 07/23/15  Yes Brunetta Jeans, PA-C  doxercalciferol (HECTOROL) 2.5 MCG capsule TABLET 1 CAPSULE BY MOUTH DAILY 03/16/15  Yes Brunetta Jeans, PA-C  DULoxetine (CYMBALTA) 60 MG capsule Take 1 capsule (60 mg total) by mouth daily. 08/14/14  Yes Brunetta Jeans, PA-C  ergocalciferol (VITAMIN D2) 50000 units capsule Take 1 capsule (50,000 Units total) by mouth every 30 (thirty) days. 07/02/15  Yes Brunetta Jeans, PA-C  fluticasone (FLONASE) 50 MCG/ACT nasal spray Place 2 sprays into both nostrils daily. 07/12/15  Yes Edward Saguier, PA-C  Fluticasone Furoate-Vilanterol (BREO ELLIPTA IN) Inhale 1 puff into the lungs daily.    Yes Historical Provider, MD  furosemide (LASIX) 40 MG tablet Take 40-80 mg by mouth daily. Take 68m when the fluid starts if continues Patient states he will take another pill   Yes Historical Provider, MD  gabapentin (NEURONTIN) 300 MG capsule TAKE 2 CAPSULES BY MOUTH THREE TIMES DAILY 10/06/14  Yes WBrunetta Jeans PA-C  Insulin Glargine (TOUJEO SOLOSTAR) 300 UNIT/ML SOPN Inject 60 Units into the skin every morning. 03/14/15  Yes AElayne Snare MD  insulin lispro (HUMALOG KWIKPEN) 100 UNIT/ML KiwkPen Inject 20 units three times a day. Patient taking differently: Inject 10 Units into the skin 3 (three) times daily. Inject 20 units three times a day. 05/04/15  Yes AElayne Snare MD  ipratropium-albuterol (DUONEB) 0.5-2.5 (3) MG/3ML SOLN Take 3 mLs by nebulization every 6 (six) hours as needed. Patient taking differently: Take 3 mLs by nebulization every 6 (six) hours as needed. Short 05/29/15  Yes WBrunetta Jeans PA-C  ketoconazole (NIZORAL) 2 % shampoo Apply 1 application topically 2 (two) times a week. 01/02/15  Yes WBrunetta Jeans PA-C  levothyroxine (SYNTHROID,  LEVOTHROID) 112 MCG tablet Take 112 mcg by mouth daily before breakfast.   Yes Historical Provider, MD  montelukast (SINGULAIR) 10 MG tablet Take 10 mg by mouth at bedtime.   Yes Historical Provider, MD  nebivolol (BYSTOLIC) 10 MG tablet Take 10 mg by mouth daily.   Yes Historical Provider, MD  nystatin (MYCOSTATIN) 100000 UNIT/ML suspension Take 5 mLs (500,000 Units total) by mouth 4 (four) times daily. 05/18/15  Yes KMauri Pole MD  olopatadine (PATANOL) 0.1 % ophthalmic solution Place 1 drop into both eyes 2 (two) times daily as needed. 05/29/15  Yes WBrunetta Jeans PA-C  ondansetron (ZOFRAN) 4 MG tablet Take  1 tablet (4 mg total) by mouth every 8 (eight) hours as needed for nausea or vomiting. 05/07/15  Yes Mauri Pole, MD  ranolazine (RANEXA) 500 MG 12 hr tablet Take 1 tablet (500 mg total) by mouth 2 (two) times daily. 07/17/15  Yes Dorothy Spark, MD  roflumilast (DALIRESP) 500 MCG TABS tablet Take 500 mcg by mouth daily.   Yes Historical Provider, MD  spironolactone (ALDACTONE) 25 MG tablet Take 25 mg by mouth daily.   Yes Historical Provider, MD  Testosterone (ANDROGEL) 20.25 MG/1.25GM (1.62%) GEL Apply 3 pumps on each arm daily (6) pumps total 03/08/15  Yes Elayne Snare, MD  tiotropium (SPIRIVA) 18 MCG inhalation capsule Place 18 mcg into inhaler and inhale daily.   Yes Historical Provider, MD  ACCU-CHEK AVIVA PLUS test strip TEST AS DIRECTED THREE TIMES DAILY 10/19/14   Brunetta Jeans, PA-C  diltiazem (CARDIZEM CD) 120 MG 24 hr capsule Take 1 capsule (120 mg total) by mouth daily. Patient not taking: Reported on 08/01/2015 03/22/15   Dorothy Spark, MD  Insulin Syringe-Needle U-100 [facility] INSULIN SYRINGE) 31G X 5/16" 0.3 ML MISC Use as directed 12/14/14   Elayne Snare, MD  OXYGEN Inhale into the lungs. 2L all day and night    Historical Provider, MD    I have reviewed the patient's current medications.  Labs:  Results for orders placed or performed during the hospital  encounter of 08/01/15 (from the past 48 hour(s))  Procalcitonin     Status: None   Collection Time: 08/05/15  6:42 AM  Result Value Ref Range   Procalcitonin 0.49 ng/mL    Comment:        Interpretation: PCT (Procalcitonin) <= 0.5 ng/mL: Systemic infection (sepsis) is not likely. Local bacterial infection is possible. (NOTE)         ICU PCT Algorithm               Non ICU PCT Algorithm    ----------------------------     ------------------------------         PCT < 0.25 ng/mL                 PCT < 0.1 ng/mL     Stopping of antibiotics            Stopping of antibiotics       strongly encouraged.               strongly encouraged.    ----------------------------     ------------------------------       PCT level decrease by               PCT < 0.25 ng/mL       >= 80% from peak PCT       OR PCT 0.25 - 0.5 ng/mL          Stopping of antibiotics                                             encouraged.     Stopping of antibiotics           encouraged.    ----------------------------     ------------------------------       PCT level decrease by              PCT >= 0.25 ng/mL       <  80% from peak PCT        AND PCT >= 0.5 ng/mL            Continuin g antibiotics                                              encouraged.       Continuing antibiotics            encouraged.    ----------------------------     ------------------------------     PCT level increase compared          PCT > 0.5 ng/mL         with peak PCT AND          PCT >= 0.5 ng/mL             Escalation of antibiotics                                          strongly encouraged.      Escalation of antibiotics        strongly encouraged.   CBC     Status: Abnormal   Collection Time: 08/05/15  6:42 AM  Result Value Ref Range   WBC 15.9 (H) 4.0 - 10.5 K/uL   RBC 3.01 (L) 4.22 - 5.81 MIL/uL   Hemoglobin 8.9 (L) 13.0 - 17.0 g/dL   HCT 27.6 (L) 39.0 - 52.0 %   MCV 91.7 78.0 - 100.0 fL   MCH 29.6 26.0 - 34.0 pg   MCHC  32.2 30.0 - 36.0 g/dL   RDW 14.3 11.5 - 15.5 %   Platelets 310 150 - 400 K/uL  Basic metabolic panel     Status: Abnormal   Collection Time: 08/05/15  6:42 AM  Result Value Ref Range   Sodium 131 (L) 135 - 145 mmol/L   Potassium 5.2 (H) 3.5 - 5.1 mmol/L   Chloride 103 101 - 111 mmol/L   CO2 18 (L) 22 - 32 mmol/L   Glucose, Bld 124 (H) 65 - 99 mg/dL   BUN 60 (H) 6 - 20 mg/dL   Creatinine, Ser 3.71 (H) 0.61 - 1.24 mg/dL   Calcium 8.2 (L) 8.9 - 10.3 mg/dL   GFR calc non Af Amer 14 (L) >60 mL/min   GFR calc Af Amer 17 (L) >60 mL/min    Comment: (NOTE) The eGFR has been calculated using the CKD EPI equation. This calculation has not been validated in all clinical situations. eGFR's persistently <60 mL/min signify possible Chronic Kidney Disease.    Anion gap 10 5 - 15  Glucose, capillary     Status: None   Collection Time: 08/05/15  7:25 AM  Result Value Ref Range   Glucose-Capillary 99 65 - 99 mg/dL  Glucose, capillary     Status: Abnormal   Collection Time: 08/05/15 11:26 AM  Result Value Ref Range   Glucose-Capillary 128 (H) 65 - 99 mg/dL  CK     Status: Abnormal   Collection Time: 08/05/15  4:56 PM  Result Value Ref Range   Total CK 449 (H) 49 - 397 U/L  Magnesium     Status: None   Collection Time: 08/05/15  4:56 PM  Result Value Ref Range   Magnesium  1.9 1.7 - 2.4 mg/dL  Sodium, urine, random     Status: None   Collection Time: 08/05/15  5:12 PM  Result Value Ref Range   Sodium, Ur <10 mmol/L  Creatinine, urine, random     Status: None   Collection Time: 08/05/15  5:12 PM  Result Value Ref Range   Creatinine, Urine 57.49 mg/dL  Glucose, capillary     Status: Abnormal   Collection Time: 08/05/15  5:14 PM  Result Value Ref Range   Glucose-Capillary 133 (H) 65 - 99 mg/dL  Glucose, capillary     Status: Abnormal   Collection Time: 08/05/15  9:21 PM  Result Value Ref Range   Glucose-Capillary 179 (H) 65 - 99 mg/dL  CBC     Status: Abnormal   Collection Time:  08/06/15  5:32 AM  Result Value Ref Range   WBC 15.8 (H) 4.0 - 10.5 K/uL   RBC 2.85 (L) 4.22 - 5.81 MIL/uL   Hemoglobin 8.4 (L) 13.0 - 17.0 g/dL   HCT 25.6 (L) 39.0 - 52.0 %   MCV 89.8 78.0 - 100.0 fL   MCH 29.5 26.0 - 34.0 pg   MCHC 32.8 30.0 - 36.0 g/dL   RDW 14.1 11.5 - 15.5 %   Platelets 328 150 - 400 K/uL  Renal function panel     Status: Abnormal   Collection Time: 08/06/15  5:32 AM  Result Value Ref Range   Sodium 130 (L) 135 - 145 mmol/L   Potassium 5.0 3.5 - 5.1 mmol/L   Chloride 102 101 - 111 mmol/L   CO2 16 (L) 22 - 32 mmol/L   Glucose, Bld 140 (H) 65 - 99 mg/dL   BUN 63 (H) 6 - 20 mg/dL   Creatinine, Ser 3.58 (H) 0.61 - 1.24 mg/dL   Calcium 8.1 (L) 8.9 - 10.3 mg/dL   Phosphorus 4.9 (H) 2.5 - 4.6 mg/dL   Albumin 2.1 (L) 3.5 - 5.0 g/dL   GFR calc non Af Amer 15 (L) >60 mL/min   GFR calc Af Amer 17 (L) >60 mL/min    Comment: (NOTE) The eGFR has been calculated using the CKD EPI equation. This calculation has not been validated in all clinical situations. eGFR's persistently <60 mL/min signify possible Chronic Kidney Disease.    Anion gap 12 5 - 15  Glucose, capillary     Status: Abnormal   Collection Time: 08/06/15  7:23 AM  Result Value Ref Range   Glucose-Capillary 117 (H) 65 - 99 mg/dL   Comment 1 Notify RN    Comment 2 Document in Chart   Glucose, capillary     Status: Abnormal   Collection Time: 08/06/15 11:17 AM  Result Value Ref Range   Glucose-Capillary 142 (H) 65 - 99 mg/dL   Comment 1 Notify RN    Comment 2 Document in Chart   Glucose, capillary     Status: Abnormal   Collection Time: 08/06/15  4:12 PM  Result Value Ref Range   Glucose-Capillary 265 (H) 65 - 99 mg/dL  Glucose, capillary     Status: None   Collection Time: 08/06/15  9:12 PM  Result Value Ref Range   Glucose-Capillary 82 65 - 99 mg/dL  Glucose, capillary     Status: Abnormal   Collection Time: 08/07/15  1:08 AM  Result Value Ref Range   Glucose-Capillary 113 (H) 65 - 99 mg/dL    Comment 1 Notify RN      ROS: A comprehensive review of  systems was negative.  Physical Exam: Filed Vitals:   08/06/15 2119 08/07/15 0618  BP: 117/65 116/60  Pulse: 70 72  Temp: 97.4 F (36.3 C) 97.7 F (36.5 C)  Resp: 18 20     General -- oriented x3, pleasant and cooperative. HEENT -- Head is normocephalic. MMM. Neck -- supple; no bruits. Integument -- intact. No rash or erythema noted Chest -- poor expansion, crackles noted at bases Cardiac -- RRR. 3/6 murmur noted  Abdomen -- soft, nontender. No masses palpable. Bowel sounds present. Extremeties - no tenderness or effusions noted. No edema. Dorsalis pedis pulses present and symmetrical.   Assessment/Plan: 1. Acute on CKD stage III, hyponatremia/ urinary retention: Chronic disease thought to be 2/2 HTN and previous obstruction from nephrolith. Cr 2.88 on admission (baseline 2.7-3.1). Multiple episodes of hypotension noted during admission, possibly contributing to renal injury. FeNa 0.5% indicating prerenal etiology. Renal US on 4/28 with no signs of obstruction. Patient is on statin with CK elevated to 449. High suspicion for ischemic ATN 2/2 hypotension. Cr 3.58>3.44. BUN 63>65. Nephrology signing off. Appointment scheduled for outpatient follow up. 2. Hypotension:  multiple BP values w/ systolic BP <24. Likely cause of AKI. Normotensive overnight. Gentle rehydration; careful not to overhydrate w/ h/o CHF and AS.  3. Anemia:  Patient appears to have baseline Hgb around 11-12.  Hemoglobin 8.9>8.4>7.8. Recommend FOBT and Iron Studies. 4. Chest Pain: EKG w/o ischemia. Suspicion for MSK pain, per primary 5. COPD: Per primary. 6. Diastolic CHF and A Fib: Continue to monitor weight with fluids. CHADS-VASc = 4. Management per primary team.  Dr. Junie Panning, DO, PGY-2  08/07/2015, 6:36 AM    Renal Attending: Renal fct near baseline at this time. Need to limit fluids with recurrent hyponatremia.  Will sign off. Jema Deegan  C

## 2015-08-07 NOTE — Progress Notes (Signed)
Pt very SOB this morning. No edema noted. Fluids at [facility]

## 2015-08-07 NOTE — Progress Notes (Signed)
Patient ID: Ricky Baker, male   DOB: 05-06-1935, 80 y.o.   MRN: [id number]    PROGRESS NOTE    Ricky Baker  [id number] DOB: 1935/07/25 DOA: 08/01/2015  PCP: Leeanne Rio, PA-C   Outpatient Specialists:   Brief Narrative:  80 y.o. male with paroxysmal atrial fibrillation on Eliquis, chronic diastolic CHF, COPD with home O2 requirement, chronic kidney disease stage III, Hypothyroidism, depression, and esophageal cancer status post resection in 2006 presented to the ED at [place] [place] with chest pain of more than one week's duration.patient, PCP on 07/26/2015, diagnosed him with pneumonia based on chest x-ray, and treated with a course of Augmentin and prednisone.   ED Course: Upon arrival to the ED, patient was found to be afebrile, saturating well on 2 L/m, and with vital signs stable. Chest x-ray demonstrates resolution of the prior infiltrate, CBC with leukocytosis 21,400 and hemoglobin of 10.2.  Assessment & Plan:  1. Chest pain  - Initial troponin 0.06; EKG without acute ischemic features  - Suspect this is MSK pain secondary to recent PNA and associated coughing fits   - resolved  - per cardiology, no need for further cardiac work up at this time - outpatient follow up with Dr. Meda Coffee   2. COPD  - Requiring supplemental O2 around-the-clock at baseline - more dyspnea this AM, will ask nephrology team if we can stop IVF - Continue DuoNeb prn, Singulair QD  - ? If we can resume lasix   3. Chronic diastolic CHF  - weight is up from 200 --> 208 --> 213 --> 201 lbs --> 209 lbs  - lasix has been stopped as Cr was trending up - daily wts, strict I/Os, fluid-restrict diet - TTE (04/04/15) with EF 0000000, grade 1 diastolic dysfunction, mild AS  - ? For nephrology team if we can resume Lasix and stop IVF   4. Paroxysmal atrial fibrillation - CHADS-VASc is 7 (age x2, CHF, DM) - pt on Eliquis but Hg trending down - Continue rate-control with  amiodarone, continue holding cardizem due to soft BP - pt already on home regimen Nebivolol, dose lowered to 2.5 mg PO QD due to lower BP   5. 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 Leukocytosis, anemia - Leukocytosis likely secondary to recent steroid use; completed 5-day course of prednisone on 4/25 - Hgb is 10.2 on admission, 7.8 this AM and WBC again up this AM  - No sign of active blood loss, hold Eliquis today - may need transfusion if Hg < 7 - CBC in AM  7. Acute on CKD stage III, hyponatremia/ hyperkalemia/ metabolic acidosis  - SCr [id number] on admission overall trending down  - Avoiding nephrotoxins where possible  - renal US unremarkable, nephrology team consulted, assistance is appreciated  - BMP in AM  8. Depression  - Appears stable  - Continue current-dose Wellbutrin   9. Vomiting  - abd XRAY unremarkable - please note CT notable with dilated esophagus full of debris concerning for distal obstruction - d/w Dr. Henrene Pastor on call, no need for EGD at this time, pt actually tolerating diet well so far and vomiting has resolved - close outpatient follow up recommended with Dr. Pete Glatter  10. ? UTI - pt with chronic urinary retention from BPH, sees urologist - some dysuria and possibly UTI based on UA - stop Rocephin after today's dose   DVT prophylaxis: on Eliquis  Code Status: Full  Family Communication: Patient at bedside  Disposition Plan: Needs SNF  Consultants:   D/W vomiting with GI on call   Nephrology team   Procedures:   None  Antimicrobials:  Rocephin   Subjective: More dyspnea this AM.  Objective: Filed Vitals:   08/07/15 0618 08/07/15 0735 08/07/15 1000 08/07/15 1346  BP: 116/60  125/87 118/59  Pulse: 72   73    Temp: 97.7 F (36.5 C)   97.4 F (36.3 C)  TempSrc: Oral   Oral  Resp: 20   18  Height:      Weight:      SpO2: 97% 95%  98%    Intake/Output Summary (Last 24 hours) at 08/07/15 1644 Last data filed at 08/07/15 1300  Gross per 24 hour  Intake    840 ml  Output    300 ml  Net    540 ml   Filed Weights   08/04/15 0500 08/05/15 0453 08/06/15 0500  Weight: 95.391 kg (210 lb 4.8 oz) 96.7 kg (213 lb 3 oz) 95.165 kg (209 lb 12.8 oz)    Examination:  General exam: Appears calm and comfortable  Respiratory system: Diminished breath sounds at bases, crackles at bases  Cardiovascular system: S1 & S2 heard, RRR. SEM 2/6, No JVD, rubs, gallops or clicks. No pedal edema. Gastrointestinal system: Abdomen is nondistended, soft and nontender.  Central nervous system: Alert and oriented. No focal neurological deficits. Psychiatry: Judgement and insight appear normal. Mood & affect appropriate.   Data Reviewed: I have personally reviewed following labs and imaging studies  CBC:  Recent Labs Lab 08/02/15 0331 08/03/15 1252 08/04/15 0608 08/05/15 0642 08/06/15 0532 08/07/15 0835  WBC 17.4* 18.6* 16.7* 15.9* 15.8* 17.4*  NEUTROABS 14.9*  --   --   --   --   --   HGB 9.1* 8.4* 9.5* 8.9* 8.4* 7.8*  HCT 28.9* 27.0* 30.2* 27.6* 25.6* 24.3*  MCV 89.8 90.9 91.8 91.7 89.8 88.0  PLT 195 223 279 310 328 [id number]   Basic Metabolic Panel:  Recent Labs Lab 08/03/15 0503 08/04/15 0608 08/05/15 0642 08/05/15 1656 08/06/15 0532 08/07/15 0835  NA 135 134* 131*  --  130* 129*  K 4.4 4.7 5.2*  --  5.0 5.3*  CL 107 106 103  --  102 101  CO2 17* 17* 18*  --  16* 16*  GLUCOSE 159* 151* 124*  --  140* 149*  BUN 53* 53* 60*  --  63* 65*  CREATININE 3.28* 3.38* 3.71*  --  3.58* 3.44*  CALCIUM 8.2* 8.3* 8.2*  --  8.1* 8.0*  MG  --   --   --  1.9  --   --   PHOS  --   --   --   --  4.9* 4.5   Liver Function Tests:  Recent Labs Lab 08/01/15 1620 08/06/15 0532 08/07/15 0835  AST 12*  --   --    ALT 9*  --   --   ALKPHOS 109  --   --   BILITOT 0.8  --   --   PROT 6.1*  --   --   ALBUMIN 2.7* 2.1* 2.1*   Cardiac Enzymes:  Recent Labs Lab 08/01/15 1620 08/01/15 2154 08/02/15 0331 08/02/15  1052 08/05/15 1656  CKTOTAL  --   --   --   --  449*  TROPONINI 0.06* 0.07* 0.06* 0.09*  --    BNP (last 3 results)  Recent Labs  10/30/14 1056  PROBNP 117.0*   CBG:  Recent Labs Lab 08/06/15 1612 08/06/15 2112 08/07/15 0108 08/07/15 0732 08/07/15 1128  GLUCAP 265* 82 113* 123* 159*    Radiology Studies: Dg Chest 2 View 08/01/2015   No acute chest findings.   Scheduled Meds: . amiodarone  200 mg Oral BID  . apixaban  5 mg Oral BID  . aspirin EC  81 mg Oral Daily  . atorvastatin  80 mg Oral Daily  . buPROPion  300 mg Oral Daily  . doxercalciferol  2.5 mcg Oral Daily  . DULoxetine  60 mg Oral Daily  . fluticasone  2 spray Each Nare Daily  . gabapentin  600 mg Oral TID  . insulin aspart  0-15 Units Subcutaneous TID WC  . insulin aspart  4 Units Subcutaneous TID WC  . insulin glargine  60 Units Subcutaneous QHS  . levothyroxine  112 mcg Oral QAC breakfast  . montelukast  10 mg Oral QHS  . nebivolol  10 mg Oral Daily  . olopatadine  1 drop Both Eyes BID  . pantoprazole  40 mg Oral Daily  . ranolazine  500 mg Oral BID  . roflumilast  500 mcg Oral Daily  . testosterone  5 g Transdermal Daily  . tiotropium  18 mcg Inhalation Daily     LOS: 6 days   Time spent: 20 minutes   Faye Ramsay, MD [facility] Pager [phone]  If 7PM-7AM, please contact night-coverage [email or web address] Password [facility] 08/07/2015, 4:44 PM

## 2015-08-08 ENCOUNTER — Observation Stay (HOSPITAL_COMMUNITY): Payer: Medicare Other

## 2015-08-08 ENCOUNTER — Encounter: Payer: Self-pay | Admitting: Gastroenterology

## 2015-08-08 DIAGNOSIS — E872 Acidosis: Secondary | ICD-10-CM

## 2015-08-08 DIAGNOSIS — G9340 Encephalopathy, unspecified: Secondary | ICD-10-CM

## 2015-08-08 DIAGNOSIS — R739 Hyperglycemia, unspecified: Secondary | ICD-10-CM

## 2015-08-08 DIAGNOSIS — J90 Pleural effusion, not elsewhere classified: Secondary | ICD-10-CM

## 2015-08-08 DIAGNOSIS — J810 Acute pulmonary edema: Secondary | ICD-10-CM

## 2015-08-08 LAB — URINALYSIS, ROUTINE W REFLEX MICROSCOPIC
Bilirubin Urine: NEGATIVE
Bilirubin Urine: NEGATIVE
Glucose, UA: NEGATIVE mg/dL
Glucose, UA: NEGATIVE mg/dL
KETONES UR: 15 mg/dL — AB
Ketones, ur: 15 mg/dL — AB
NITRITE: NEGATIVE
NITRITE: NEGATIVE
PH: 5 (ref 5.0–8.0)
Protein, ur: NEGATIVE mg/dL
Protein, ur: NEGATIVE mg/dL
SPECIFIC GRAVITY, URINE: 1.014 (ref 1.005–1.030)
SPECIFIC GRAVITY, URINE: 1.015 (ref 1.005–1.030)
pH: 5 (ref 5.0–8.0)

## 2015-08-08 LAB — BLOOD GAS, ARTERIAL
Acid-base deficit: 10.9 mmol/L — ABNORMAL HIGH (ref 0.0–2.0)
BICARBONATE: 14.6 meq/L — AB (ref 20.0–24.0)
DRAWN BY: 244801
O2 Content: 2 L/min
O2 Saturation: 92.7 %
PH ART: 7.273 — AB (ref 7.350–7.450)
Patient temperature: 98.6
TCO2: 15.6 mmol/L (ref 0–100)
pCO2 arterial: 32.6 mmHg — ABNORMAL LOW (ref 35.0–45.0)
pO2, Arterial: 76.8 mmHg — ABNORMAL LOW (ref 80.0–100.0)

## 2015-08-08 LAB — URINE MICROSCOPIC-ADD ON

## 2015-08-08 LAB — CK: Total CK: 66 U/L (ref 49–397)

## 2015-08-08 LAB — BASIC METABOLIC PANEL
Anion gap: 12 (ref 5–15)
BUN: 64 mg/dL — ABNORMAL HIGH (ref 6–20)
CO2: 18 mmol/L — ABNORMAL LOW (ref 22–32)
Calcium: 8.2 mg/dL — ABNORMAL LOW (ref 8.9–10.3)
Chloride: 100 mmol/L — ABNORMAL LOW (ref 101–111)
Creatinine, Ser: 3.42 mg/dL — ABNORMAL HIGH (ref 0.61–1.24)
GFR, EST AFRICAN AMERICAN: 18 mL/min — AB (ref >60)
GFR, EST NON AFRICAN AMERICAN: 16 mL/min — AB (ref >60)
Glucose, Bld: 128 mg/dL — ABNORMAL HIGH (ref 65–99)
Potassium: 5.1 mmol/L (ref 3.5–5.1)
SODIUM: 130 mmol/L — AB (ref 135–145)

## 2015-08-08 LAB — CBC
HCT: 25.2 % — ABNORMAL LOW (ref 39.0–52.0)
Hemoglobin: 7.9 g/dL — ABNORMAL LOW (ref 13.0–17.0)
MCH: 27.8 pg (ref 26.0–34.0)
MCHC: 31.3 g/dL (ref 30.0–36.0)
MCV: 88.7 fL (ref 78.0–100.0)
Platelets: 458 10*3/uL — ABNORMAL HIGH (ref 150–400)
RBC: 2.84 MIL/uL — ABNORMAL LOW (ref 4.22–5.81)
RDW: 14.3 % (ref 11.5–15.5)
WBC: 19.9 10*3/uL — ABNORMAL HIGH (ref 4.0–10.5)

## 2015-08-08 LAB — GLUCOSE, CAPILLARY
GLUCOSE-CAPILLARY: 116 mg/dL — AB (ref 65–99)
Glucose-Capillary: 147 mg/dL — ABNORMAL HIGH (ref 65–99)
Glucose-Capillary: 147 mg/dL — ABNORMAL HIGH (ref 65–99)
Glucose-Capillary: 145 mg/dL — ABNORMAL HIGH (ref 65–99)

## 2015-08-08 LAB — LACTIC ACID, PLASMA: Lactic Acid, Venous: 0.9 mmol/L (ref 0.5–2.0)

## 2015-08-08 MED ORDER — FUROSEMIDE 10 MG/ML IJ SOLN
20.0000 mg | Freq: Once | INTRAMUSCULAR | Status: AC
  Administered 2015-08-08: 20 mg via INTRAVENOUS
  Filled 2015-08-08: qty 2

## 2015-08-08 MED ORDER — CETYLPYRIDINIUM CHLORIDE 0.05 % MT LIQD
7.0000 mL | Freq: Two times a day (BID) | OROMUCOSAL | Status: DC
  Administered 2015-08-08 – 2015-08-12 (×10): 7 mL via OROMUCOSAL

## 2015-08-08 MED ORDER — CHLORHEXIDINE GLUCONATE 0.12 % MT SOLN
15.0000 mL | Freq: Two times a day (BID) | OROMUCOSAL | Status: DC
  Administered 2015-08-08 – 2015-08-12 (×7): 15 mL via OROMUCOSAL
  Filled 2015-08-08 (×10): qty 15

## 2015-08-08 MED ORDER — IPRATROPIUM-ALBUTEROL 0.5-2.5 (3) MG/3ML IN SOLN
3.0000 mL | Freq: Four times a day (QID) | RESPIRATORY_TRACT | Status: DC
  Administered 2015-08-08 – 2015-08-10 (×7): 3 mL via RESPIRATORY_TRACT
  Filled 2015-08-08 (×7): qty 3

## 2015-08-08 NOTE — Telephone Encounter (Signed)
Pt was no show 08/03/15 11:15am for f/u appt, looks like he had xray same day, charge or no charge?

## 2015-08-08 NOTE — Progress Notes (Signed)
Patient ID: Ricky Baker, male   DOB: 1936-01-05, 80 y.o.   MRN: [id number]    PROGRESS NOTE    Ricky Baker  [id number] DOB: December 22, 1935 DOA: 08/01/2015  PCP: Leeanne Rio, PA-C   Outpatient Specialists:   Brief Narrative:  80 y.o. male with paroxysmal atrial fibrillation on Eliquis, chronic diastolic CHF, COPD with home O2 requirement, chronic kidney disease stage III, Hypothyroidism, depression, and esophageal cancer status post resection in 2006 presented to the ED at [facility] [place] with chest pain of more than one week's duration.patient, PCP on 07/26/2015, diagnosed him with pneumonia based on chest x-ray, and treated with a course of Augmentin and prednisone.   ED Course: Upon arrival to the ED, patient was found to be afebrile, saturating well on 2 L/m, and with vital signs stable. Chest x-ray demonstrates resolution of the prior infiltrate, CBC with leukocytosis 21,400 and hemoglobin of 10.2.   Major events since admission: 5/3 - more dyspnea, CXR with vascular congestion, PCCM consulted   Assessment & Plan:  1. Acute encephalopathy - ? Uremia  - discuss with nephology team   2. Acute respiratory distress / COPD / Pulmonary vascular congestion - see below  - Requiring supplemental O2 around-the-clock at baseline - more dyspnea this AM, with crackles on exam this AM, IVF stopped and gave Lasix 40 mg total IV - Continue DuoNeb, Singulair QD  - PCCM consulted   3. Acute on Chronic diastolic CHF  - weight trend since admission: 200 --> 208 --> 213 --> 201 lbs --> 209 lbs --> pending this AM  - gave 40 mg Lasix IV today due to more crackles as noted above  - TTE (04/04/15) with EF 0000000, grade 1 diastolic dysfunction, mild AS  - unable to give more lasix in the setting of worsening renal function  - PCCM consulted   4. Paroxysmal atrial fibrillation - CHADS-VASc is 80 (age x2, CHF, DM) - pt on Eliquis but since Hg trending down, holding Eliquis  -  Continue rate-control with amiodarone, continue holding cardizem due to soft BP - pt already on home regimen Nebivolol, dose lowered to 2.5 mg PO QD due to lower BP   5. 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 Leukocytosis, anemia of chronic disease  - was on steroids recently but unclear why trending up  - No sign of active blood loss, hold Eliquis today as well  - may need transfusion if Hg < 7 - check lactic acid  - CBC in AM  7. Acute on CKD stage III, hyponatremia/ hyperkalemia/ metabolic acidosis  - SCr [id number] on admission, now ~ 3.4  - Avoiding nephrotoxins where possible  - renal US unremarkable, nephrology team consulted, assistance is appreciated  - BMP in AM  8. Depression  - Appears stable  - Continue current-dose Wellbutrin   9. Vomiting  - abd XRAY unremarkable - please note CT notable with dilated esophagus full of debris concerning for distal obstruction - d/w Dr. Henrene Pastor on call, no need for EGD at this time, pt actually tolerating diet well so far and vomiting has resolved - close outpatient follow up recommended with Dr. Pete Glatter  10. ? UTI - pt with chronic urinary  retention from [place], sees urologist - some dysuria and possibly UTI based on UA - completed treatment with ABX   DVT prophylaxis: holding Eliquis due to low Hg Code Status: Full  Family Communication: Patient at bedside  Disposition Plan: Needs SNF  Consultants:   D/W vomiting with GI on call   Nephrology team   PCCM  Procedures:   None  Antimicrobials:  Rocephin completed   Subjective: More dyspnea this AM.  Objective: Filed Vitals:   08/08/15 1343 08/08/15 1355 08/08/15 1500 08/08/15 1644  BP:  127/60 131/85 128/63  Pulse:    80  Temp:        TempSrc:      Resp:  22 21 18   Height:      Weight:      SpO2: 96% 97% 95% 96%    Intake/Output Summary (Last 24 hours) at 08/08/15 1826 Last data filed at 08/08/15 1639  Gross per 24 hour  Intake    265 ml  Output   1900 ml  Net  -1635 ml   Filed Weights   08/04/15 0500 08/05/15 0453 08/06/15 0500  Weight: 95.391 kg (210 lb 4.8 oz) 96.7 kg (213 lb 3 oz) 95.165 kg (209 lb 12.8 oz)    Examination:  General exam: Appears confused but able to follow commands  Respiratory system: Diminished breath sounds at bases, crackles at bases  Cardiovascular system: S1 & S2 heard, RRR. SEM 2/6, No JVD, rubs, gallops or clicks. No pedal edema. Gastrointestinal system: Abdomen is nondistended, soft and nontender.  Central nervous system: Alert and oriented. No focal neurological deficits.  Data Reviewed: I have personally reviewed following labs and imaging studies  CBC:  Recent Labs Lab 08/02/15 0331  08/04/15 0608 08/05/15 0642 08/06/15 0532 08/07/15 0835 08/08/15 0444  WBC 17.4*  < > 16.7* 15.9* 15.8* 17.4* 19.9*  NEUTROABS 14.9*  --   --   --   --   --   --   HGB 9.1*  < > 9.5* 8.9* 8.4* 7.8* 7.9*  HCT 28.9*  < > 30.2* 27.6* 25.6* 24.3* 25.2*  MCV 89.8  < > 91.8 91.7 89.8 88.0 88.7  PLT 195  < > 279 310 328 357 458*  < > = values in this interval not displayed. Basic Metabolic Panel:  Recent Labs Lab 08/04/15 0608 08/05/15 0642 08/05/15 1656 08/06/15 0532 08/07/15 0835 08/08/15 0444  NA 134* 131*  --  130* 129* 130*  K 4.7 5.2*  --  5.0 5.3* 5.1  CL 106 103  --  102 101 100*  CO2 17* 18*  --  16* 16* 18*  GLUCOSE 151* 124*  --  140* 149* 128*  BUN 53* 60*  --  63* 65* 64*  CREATININE 3.38* 3.71*  --  3.58* 3.44* 3.42*  CALCIUM 8.3* 8.2*  --  8.1* 8.0* 8.2*  MG  --   --  1.9  --   --   --   PHOS  --   --   --  4.9* 4.5  --    Liver Function Tests:  Recent Labs Lab 08/06/15 0532 08/07/15 0835  ALBUMIN 2.1* 2.1*   Cardiac Enzymes:  Recent Labs Lab  08/01/15 2154 08/02/15 0331 08/02/15 1052 08/05/15 1656 08/08/15 0444  CKTOTAL  --   --   --  449* 66  TROPONINI 0.07* 0.06* 0.09*  --   --    BNP (last 3 results)  Recent Labs  10/30/14 1056  PROBNP  117.0*   CBG:  Recent Labs Lab 08/07/15 1657 08/07/15 2200 08/08/15 0725 08/08/15 1130 08/08/15 1627  GLUCAP 72 124* 116* 147* 147*    Radiology Studies: Dg Chest 2 View 08/01/2015   No acute chest findings.   Marland Kitchen amiodarone  200 mg Oral BID  . antiseptic oral rinse  7 mL Mouth Rinse q12n4p  . aspirin EC  81 mg Oral Daily  . atorvastatin  80 mg Oral Daily  . buPROPion  300 mg Oral Daily  . chlorhexidine  15 mL Mouth Rinse BID  . doxercalciferol  2.5 mcg Oral Daily  . DULoxetine  60 mg Oral Daily  . fluticasone  2 spray Each Nare Daily  . gabapentin  600 mg Oral TID  . insulin aspart  0-15 Units Subcutaneous TID WC  . ipratropium-albuterol  3 mL Nebulization Q6H  . levothyroxine  112 mcg Oral QAC breakfast  . montelukast  10 mg Oral QHS  . nebivolol  2.5 mg Oral Daily  . pantoprazole  40 mg Oral Daily  . ranolazine  500 mg Oral BID  . roflumilast  500 mcg Oral Daily  . senna  1 tablet Oral BID  . sodium bicarbonate  650 mg Oral BID  . sodium chloride  500 mL Intravenous Once  . testosterone  5 g Transdermal Daily  . [START ON 08/24/2015] Vitamin D (Ergocalciferol)  50,000 Units Oral Q30 days     LOS: 7 days   Time spent: 20 minutes   Faye Ramsay, MD [facility] Pager [phone]  If 7PM-7AM, please contact night-coverage [email or web address] Password [facility] 08/08/2015, 6:26 PM

## 2015-08-08 NOTE — Progress Notes (Signed)
Pt with increased WOB and c/o SOB. Lungs diminished with scattered exp wheezes. Duo neb given without much improvement. Pts lasix has been on hold due to increasing Cr. Pt is on 2L/[place]. Tylene Fantasia paged and informed/VS charted in Epic. Orders received to Stat PCXR . Will monitor. Jessie Foot, RN

## 2015-08-08 NOTE — Progress Notes (Signed)
Physical Therapy Treatment Patient Details Name: Ricky Baker MRN: [id number] DOB: 1936/01/05 Today's Date: 08/08/2015    History of Present Illness Patient is a 80 yo male admitted 08/01/15 with pleuritic chest pain, possibly musculoskeletal pain from recent pna, CHF.    PMH:  PAF, CHF, COPD on home O2, CKD, Depression    PT Comments    Pt is much weaker at this time compared to last session. He is more lethargic, slower to respond, and he was unable to sit EOB with max assist.  I doubt he would be able to stand today, although he was not willing to try.  Repositioned back in bed with RN's assist.  He continues to be appropriate for SNF level rehab at discharge and frequency changed to reflect department standards, however, pt would benefit from therapy more times per week if able.   Follow Up Recommendations  SNF;Supervision/Assistance - 24 hour     Equipment Recommendations  None recommended by PT    Recommendations for Other Services   NA     Precautions / Restrictions Precautions Precautions: Fall Precaution Comments: pt with significant h/o falls at home    Mobility  Bed Mobility Overal bed mobility: Needs Assistance Bed Mobility: Rolling;Sidelying to Sit Rolling: Max assist Sidelying to sit: Max assist;Total assist       General bed mobility comments: Pt agreeable to EOB, but once attempted, he was unable to get to sitting from sidelying with max assist from therapist.  After several attempts pt getting irritable and wanting to go back to supine to rest. Max assist to roll to reposition bed linens and help pt scoot up to [facility].                                               Balance     Sitting balance-Leahy Scale: Zero                              Cognition Arousal/Alertness: Lethargic Behavior During Therapy: Flat affect Overall Cognitive Status: Impaired/Different from baseline Area of Impairment: Attention;Following  commands;Safety/judgement;Awareness;Problem solving   Current Attention Level: Focused   Following Commands: Follows one step commands consistently Safety/Judgement: Decreased awareness of safety;Decreased awareness of deficits Awareness: Intellectual Problem Solving: Difficulty sequencing;Requires verbal cues;Requires tactile cues General Comments: Pt initiates going to sitting and remembers that I am PT, but is otherwise very lethargic and out of it today compared to last session where he was lethargic at times, but very chatty.        General Comments General comments (skin integrity, edema, etc.): Pt dyspnic at rest.      Pertinent Vitals/Pain Pain Assessment: No/denies pain           PT Goals (current goals can now be found in the care plan section) Acute Rehab PT Goals Patient Stated Goal: to get stronger and go home Progress towards PT goals: Progressing toward goals    Frequency  Min 2X/week    PT Plan Current plan remains appropriate;Frequency needs to be updated    Co-evaluation             End of Session Equipment Utilized During Treatment: Oxygen Activity Tolerance: Patient limited by fatigue;Patient limited by lethargy Patient left: in bed;with call bell/phone within reach;with bed alarm set  Time: [id number] PT Time Calculation (min) (ACUTE ONLY): 8 min  Charges:  $Therapeutic Activity: 8-22 mins                      Ricky Baker B. [place], [place], DPT [phone]   08/08/2015, 2:09 PM

## 2015-08-08 NOTE — Progress Notes (Signed)
RT Note: First attempt at ABG was unsuccessful because venous blood was obtained instead of arterial blood. Patient was stuck again and arterial blood was able to be obtained on the second attempt. Patient tolerated both sticks well with no issues. Rt will continue to monitor.

## 2015-08-08 NOTE — Telephone Encounter (Signed)
No charge -- he was admitted to hospital

## 2015-08-08 NOTE — Consult Note (Signed)
Name: Ricky Baker MRN: [id number] DOB: 22-Feb-1936    ADMISSION DATE:  08/01/2015 CONSULTATION DATE:  5/3  REFERRING MD :  Doyle Askew ([facility])   CHIEF COMPLAINT:  SOB   BRIEF PATIENT DESCRIPTION: 80yo male with hx chronic respiratory failure, COPD on home O2 (FEV1 47%),  CKD III, PAF on Eliquis, chronic dCHF recently treated as outpt 4/20 with Augmentin for PNA based on CXR.  He presented 4/26 with 1 week chest discomfort.  He was admitted by [facility] and treated with abx for ?UTI +/- PNA and acute on CKD.  Troponin was neg and pain thought MSK r/t PNA and significant cough.  On 5/3 remains dyspneic/tachypneic with increased somnolence and worsening renal function and PCCM consulted.    SIGNIFICANT EVENTS    STUDIES:  CT chest 4/29>>> Minimal bilateral posterior basilar pleural effusions are noted with adjacent subsegmental atelectasis. Dilated esophagus is noted full of debris concerning for distal obstruction.   HISTORY OF PRESENT ILLNESS:  80yo male with hx chronic respiratory failure, COPD on home O2 (FEV1 47%),  CKD III, PAF on Eliquis, chronic dCHF recently treated as outpt 4/20 with Augmentin for PNA based on CXR.  He presented 4/26 with 1 week chest discomfort.  He was admitted by [facility] and treated with abx for ?UTI +/- PNA and acute on CKD.  Troponin was neg and pain thought MSK r/t PNA and significant cough.  On 5/3 remains dyspneic/tachypneic with increased somnolence and worsening renal function and PCCM consulted.   Currently denies chest pain, n/v, cough or purulent sputum.  Pt is awake and interactive but slow to respond, mildly confused.   PAST MEDICAL HISTORY :   has a past medical history of COPD (chronic obstructive pulmonary disease) ([place]); CHF (congestive heart failure) ([place]); Thyroid disease; Chronic bronchitis ([place]); Atrial fibrillation ([place]); CKD (chronic kidney disease); Diabetes mellitus without complication ([place]); Allergy; Esophageal cancer ([place]); CKD (chronic kidney  disease); Depression; Hypertension; Colon polyps; CAD (coronary artery disease); Hyperlipidemia; GERD (gastroesophageal reflux disease); History of renal stone; Stroke [facility]); Shortness of breath dyspnea; Pneumonia; Dyslexia; Enlarged prostate; Headache; Anemia; Loose stools; and Flatulence/gas pain/belching.  has past surgical history that includes Esophagus surgery (2006); Spine surgery; Cardiac catheterization (2009); Tonsillectomy; Eye surgery (Bilateral); Colonoscopy; and Esophagogastroduodenoscopy (egd) with propofol (N/A, 05/16/2015). Prior to Admission medications   Medication Sig Start Date End Date Taking? Authorizing Provider  amiodarone (PACERONE) 200 MG tablet Take 1 tablet (200 mg total) by mouth 2 (two) times daily. 07/17/15  Yes Dorothy Spark, MD  apixaban (ELIQUIS) 5 MG TABS tablet Take 1 tablet (5 mg total) by mouth 2 (two) times daily. 07/17/15  Yes Dorothy Spark, MD  atorvastatin (LIPITOR) 80 MG tablet Take 1 tablet (80 mg total) by mouth daily. 08/31/14  Yes Elayne Snare, MD  azelastine (ASTELIN) 0.1 % nasal spray Place 2 sprays into both nostrils at bedtime as needed for rhinitis. Use in each nostril as directed 07/26/15  Yes Colon Branch, MD  buPROPion (WELLBUTRIN XL) 300 MG 24 hr tablet TAKE 1 TABLET(300 MG) BY MOUTH DAILY 07/23/15  Yes Brunetta Jeans, PA-C  doxercalciferol (HECTOROL) 2.5 MCG capsule TABLET 1 CAPSULE BY MOUTH DAILY 03/16/15  Yes Brunetta Jeans, PA-C  DULoxetine (CYMBALTA) 60 MG capsule Take 1 capsule (60 mg total) by mouth daily. 08/14/14  Yes Brunetta Jeans, PA-C  ergocalciferol (VITAMIN D2) 50000 units capsule Take 1 capsule (50,000 Units total) by mouth every 30 (thirty) days. 07/02/15  Yes Brunetta Jeans,  PA-C  fluticasone (FLONASE) 50 MCG/ACT nasal spray Place 2 sprays into both nostrils daily. 07/12/15  Yes Edward Saguier, PA-C  Fluticasone Furoate-Vilanterol (BREO ELLIPTA IN) Inhale 1 puff into the lungs daily.    Yes Historical Provider, MD  furosemide  (LASIX) 40 MG tablet Take 40-80 mg by mouth daily. Take 40mg  when the fluid starts if continues Patient states he will take another pill   Yes Historical Provider, MD  gabapentin (NEURONTIN) 300 MG capsule TAKE 2 CAPSULES BY MOUTH THREE TIMES DAILY 10/06/14  Yes Brunetta Jeans, PA-C  Insulin Glargine (TOUJEO SOLOSTAR) 300 UNIT/ML SOPN Inject 60 Units into the skin every morning. 03/14/15  Yes Elayne Snare, MD  insulin lispro (HUMALOG KWIKPEN) 100 UNIT/ML KiwkPen Inject 20 units three times a day. Patient taking differently: Inject 10 Units into the skin 3 (three) times daily. Inject 20 units three times a day. 05/04/15  Yes Elayne Snare, MD  ipratropium-albuterol (DUONEB) 0.5-2.5 (3) MG/3ML SOLN Take 3 mLs by nebulization every 6 (six) hours as needed. Patient taking differently: Take 3 mLs by nebulization every 6 (six) hours as needed. Short 05/29/15  Yes Brunetta Jeans, PA-C  ketoconazole (NIZORAL) 2 % shampoo Apply 1 application topically 2 (two) times a week. 01/02/15  Yes Brunetta Jeans, PA-C  levothyroxine (SYNTHROID, LEVOTHROID) 112 MCG tablet Take 112 mcg by mouth daily before breakfast.   Yes Historical Provider, MD  montelukast (SINGULAIR) 10 MG tablet Take 10 mg by mouth at bedtime.   Yes Historical Provider, MD  nebivolol (BYSTOLIC) 10 MG tablet Take 10 mg by mouth daily.   Yes Historical Provider, MD  nystatin (MYCOSTATIN) 100000 UNIT/ML suspension Take 5 mLs (500,000 Units total) by mouth 4 (four) times daily. 05/18/15  Yes Mauri Pole, MD  olopatadine (PATANOL) 0.1 % ophthalmic solution Place 1 drop into both eyes 2 (two) times daily as needed. 05/29/15  Yes Brunetta Jeans, PA-C  ondansetron (ZOFRAN) 4 MG tablet Take 1 tablet (4 mg total) by mouth every 8 (eight) hours as needed for nausea or vomiting. 05/07/15  Yes Mauri Pole, MD  ranolazine (RANEXA) 500 MG 12 hr tablet Take 1 tablet (500 mg total) by mouth 2 (two) times daily. 07/17/15  Yes Dorothy Spark, MD  roflumilast  (DALIRESP) 500 MCG TABS tablet Take 500 mcg by mouth daily.   Yes Historical Provider, MD  spironolactone (ALDACTONE) 25 MG tablet Take 25 mg by mouth daily.   Yes Historical Provider, MD  Testosterone (ANDROGEL) 20.25 MG/1.25GM (1.62%) GEL Apply 3 pumps on each arm daily (6) pumps total 03/08/15  Yes Elayne Snare, MD  tiotropium (SPIRIVA) 18 MCG inhalation capsule Place 18 mcg into inhaler and inhale daily.   Yes Historical Provider, MD  ACCU-CHEK AVIVA PLUS test strip TEST AS DIRECTED THREE TIMES DAILY 10/19/14   Brunetta Jeans, PA-C  diltiazem (CARDIZEM CD) 120 MG 24 hr capsule Take 1 capsule (120 mg total) by mouth daily. Patient not taking: Reported on 08/01/2015 03/22/15   Dorothy Spark, MD  Insulin Syringe-Needle U-100 [facility] INSULIN SYRINGE) 31G X 5/16" 0.3 ML MISC Use as directed 12/14/14   Elayne Snare, MD  OXYGEN Inhale into the lungs. 2L all day and night    Historical Provider, MD   Allergies  Allergen Reactions  . Dust Mite Extract Other (See Comments)    Runny nose   . Pollen Extract Other (See Comments)    Runny nose    FAMILY HISTORY:  family history includes  COPD in his father. There is no history of Diabetes. SOCIAL HISTORY:  reports that he quit smoking about 14 years ago. His smoking use included Cigarettes. He has a 42 pack-year smoking history. He has never used smokeless tobacco. He reports that he does not drink alcohol or use illicit drugs.  REVIEW OF SYSTEMS:   As per HPI - All other systems reviewed and were neg.    SUBJECTIVE:   VITAL SIGNS: Temp:  [97.6 F (36.4 C)-98 F (36.7 C)] 97.6 F (36.4 C) (05/03 0748) Pulse Rate:  [78-80] 80 (05/03 0748) Resp:  [16-24] 21 (05/03 1500) BP: (115-137)/(19-84) 127/60 mmHg (05/03 1355) SpO2:  [95 %-99 %] 95 % (05/03 1500)  PHYSICAL EXAMINATION: General:  Chronically ill appearing male, NAD  Neuro:  Awake, interactive but slow to respond, confused, MAE HEENT:  Mm dry, pale  Cardiovascular:  s1s2  rrr Lungs:  resps even non labored on [place], tachypneic but not increased WOB, diminished bases, few scattered crackles Abdomen:  Round, mildly distended, non tender +bs  Musculoskeletal:  Warm and dry, pale, no edema    Recent Labs Lab 08/06/15 0532 08/07/15 0835 08/08/15 0444  NA 130* 129* 130*  K 5.0 5.3* 5.1  CL 102 101 100*  CO2 16* 16* 18*  BUN 63* 65* 64*  CREATININE 3.58* 3.44* 3.42*  GLUCOSE 140* 149* 128*    Recent Labs Lab 08/06/15 0532 08/07/15 0835 08/08/15 0444  HGB 8.4* 7.8* 7.9*  HCT 25.6* 24.3* 25.2*  WBC 15.8* 17.4* 19.9*  PLT 328 357 458*   Dg Chest Port 1 View  08/08/2015  CLINICAL DATA:  Shortness of breath EXAM: PORTABLE CHEST 1 VIEW COMPARISON:  08/01/2015 FINDINGS: Cardiac enlargement is identified. Increase in bilateral pleural effusions with progressive pulmonary edema. Aortic atherosclerosis noted. No airspace consolidation identified. IMPRESSION: 1. Cardiac enlargement and CHF. Electronically Signed   By: Kerby Moors M.D.   On: 08/08/2015 07:24    ASSESSMENT / PLAN:  Dyspnea/tachypnea -- multifactorial in setting need for respiratory compensation for metabolic acidosis r/t AKI on CKD III as well as pulmonary edema in setting likely decompensated heart failure with underlying COPD.  CXR with pulm edema, no obvious infiltrate.  PLAN -  Renal following - ??bicarb gtt  Supplemental O2 as needed  Diuresis -- on hold for now with worsening renal function  pulm hygiene  F/u CXR  F/u ABG -- suspect venous gas  BD's - change duoneb to scheduled q6, hold breo and singulair for now Would ensure SD status  Monitor resp status closely    Acute on CKD --?r/t hypotension - now resolved. Baseline Scr 2~2.8.  Metabolic acidosis  Hyponatremia  Hyperkalemia -- mild  PLAN -  Check lactate, pct, CK  F/u chem  Per renal    Attending Note:  80 year old male with PMH of COPD and CHF who presents to the hospital with chest pain.  While in the hospital,  kidney function continued to deteriorate and evolved metabolic acidosis.  Patient with increased RR due to compensation for metabolic acidosis.  On exam, lungs with bibasilar crackles.  I reviewed chest CT myself, small right sided effusion with atelectasis.  Discussed with [facility]-MD.  Tachypnea: due to compensation for metabolic acidosis.  - ?Bicarb drip.  - Defer to renal.  - No interventions necessary.  Metabolic acidosis due to renal failure and hyperchloremia.  - Lasix given should be helpful with that.  - Respiratory compensation ok.  Pulmonary edema:  - Hold further  lasix at this point.  Pleural effusion: due to CHF.  - No thora.  - F/U CXR.  AMS: due to uremia, improved with 1L output after 60 of lasix.  Will defer to renal.  PCCM will sign off, please call back if needed.  Patient seen and examined, agree with above note.  I dictated the care and orders written for this patient under my direction.  Rush Farmer, MD [phone]

## 2015-08-09 ENCOUNTER — Observation Stay (HOSPITAL_COMMUNITY): Payer: Medicare Other

## 2015-08-09 DIAGNOSIS — I5033 Acute on chronic diastolic (congestive) heart failure: Secondary | ICD-10-CM

## 2015-08-09 DIAGNOSIS — I5032 Chronic diastolic (congestive) heart failure: Secondary | ICD-10-CM | POA: Diagnosis present

## 2015-08-09 LAB — URINE MICROSCOPIC-ADD ON

## 2015-08-09 LAB — CBC
HCT: 23.7 % — ABNORMAL LOW (ref 39.0–52.0)
HEMOGLOBIN: 7.6 g/dL — AB (ref 13.0–17.0)
MCH: 28.5 pg (ref 26.0–34.0)
MCHC: 32.1 g/dL (ref 30.0–36.0)
MCV: 88.8 fL (ref 78.0–100.0)
PLATELETS: 464 10*3/uL — AB (ref 150–400)
RBC: 2.67 MIL/uL — AB (ref 4.22–5.81)
RDW: 14.4 % (ref 11.5–15.5)
WBC: 19.9 10*3/uL — ABNORMAL HIGH (ref 4.0–10.5)

## 2015-08-09 LAB — RENAL FUNCTION PANEL
ALBUMIN: 2.3 g/dL — AB (ref 3.5–5.0)
Anion gap: 14 (ref 5–15)
BUN: 77 mg/dL — ABNORMAL HIGH (ref 6–20)
CHLORIDE: 102 mmol/L (ref 101–111)
CO2: 17 mmol/L — AB (ref 22–32)
Calcium: 8.4 mg/dL — ABNORMAL LOW (ref 8.9–10.3)
Creatinine, Ser: 3.59 mg/dL — ABNORMAL HIGH (ref 0.61–1.24)
GFR calc Af Amer: 17 mL/min — ABNORMAL LOW (ref >60)
GFR calc non Af Amer: 15 mL/min — ABNORMAL LOW (ref >60)
GLUCOSE: 159 mg/dL — AB (ref 65–99)
PHOSPHORUS: 4.6 mg/dL (ref 2.5–4.6)
POTASSIUM: 5.3 mmol/L — AB (ref 3.5–5.1)
Sodium: 133 mmol/L — ABNORMAL LOW (ref 135–145)

## 2015-08-09 LAB — GLUCOSE, CAPILLARY
GLUCOSE-CAPILLARY: 171 mg/dL — AB (ref 65–99)
Glucose-Capillary: 137 mg/dL — ABNORMAL HIGH (ref 65–99)
Glucose-Capillary: 144 mg/dL — ABNORMAL HIGH (ref 65–99)
Glucose-Capillary: 189 mg/dL — ABNORMAL HIGH (ref 65–99)

## 2015-08-09 LAB — URINALYSIS, ROUTINE W REFLEX MICROSCOPIC
Glucose, UA: NEGATIVE mg/dL
Ketones, ur: 15 mg/dL — AB
NITRITE: NEGATIVE
Protein, ur: NEGATIVE mg/dL
SPECIFIC GRAVITY, URINE: 1.019 (ref 1.005–1.030)
pH: 5 (ref 5.0–8.0)

## 2015-08-09 LAB — BASIC METABOLIC PANEL
Anion gap: 16 — ABNORMAL HIGH (ref 5–15)
BUN: 73 mg/dL — AB (ref 6–20)
CHLORIDE: 100 mmol/L — AB (ref 101–111)
CO2: 16 mmol/L — ABNORMAL LOW (ref 22–32)
CREATININE: 3.47 mg/dL — AB (ref 0.61–1.24)
Calcium: 8.2 mg/dL — ABNORMAL LOW (ref 8.9–10.3)
GFR calc Af Amer: 18 mL/min — ABNORMAL LOW (ref >60)
GFR calc non Af Amer: 16 mL/min — ABNORMAL LOW (ref >60)
GLUCOSE: 123 mg/dL — AB (ref 65–99)
POTASSIUM: 5.3 mmol/L — AB (ref 3.5–5.1)
Sodium: 132 mmol/L — ABNORMAL LOW (ref 135–145)

## 2015-08-09 LAB — CK: CK TOTAL: 67 U/L (ref 49–397)

## 2015-08-09 LAB — BRAIN NATRIURETIC PEPTIDE: B Natriuretic Peptide: 919.9 pg/mL — ABNORMAL HIGH (ref 0.0–100.0)

## 2015-08-09 MED ORDER — ENSURE ENLIVE PO LIQD
237.0000 mL | Freq: Two times a day (BID) | ORAL | Status: DC
  Administered 2015-08-10: 237 mL via ORAL

## 2015-08-09 MED ORDER — SODIUM POLYSTYRENE SULFONATE 15 GM/60ML PO SUSP
30.0000 g | Freq: Once | ORAL | Status: AC
  Administered 2015-08-09: 30 g via ORAL
  Filled 2015-08-09: qty 120

## 2015-08-09 MED ORDER — AMIODARONE HCL 200 MG PO TABS
200.0000 mg | ORAL_TABLET | Freq: Every day | ORAL | Status: DC
  Administered 2015-08-10 – 2015-08-13 (×4): 200 mg via ORAL
  Filled 2015-08-09 (×4): qty 1

## 2015-08-09 NOTE — Progress Notes (Signed)
Report received via [place] RN in patient's room using [employer], reviewed orders, labs, VS, meds and patient's general condition, assumed care for patient

## 2015-08-09 NOTE — Progress Notes (Signed)
Subjective:  Reports no acute concerns Objective Vital signs in last 24 hours: Filed Vitals:   08/09/15 1204 08/09/15 1443 08/09/15 1444 08/09/15 1445  BP: 117/64     Pulse: 80 77 78 78  Temp: 97.9 F (36.6 C)     TempSrc: Oral     Resp: 15 13 14 13   Height:      Weight:      SpO2: 97% 98% 98% 97%   Weight change:   Intake/Output Summary (Last 24 hours) at 08/09/15 1458 Last data filed at 08/09/15 0825  Gross per 24 hour  Intake    355 ml  Output    250 ml  Net    105 ml    Assessment/ Plan: Pt is a 80 y.o. yo male who was admitted on 08/01/2015 with acute on chronic kidney disease  Assessment/Plan: 1. AoCKD/metabolic acidosis- Cr [id number] at admission. However minimal change in GFR with good urine output. Increase in creatinine likely contributed to by multiple episodes of hypotension, and aggressive diuresis. Reduce diureses as tolerated given concern for diastolic heart failure.  Will hold on more aggressive measures as patient's renal function is overal stable and is still making adequate urine. Will collect urine microscopy to rule out intrinsic renal pathology 2. Anemia- Hgb 7.6, stable 3. HTN/volume- Weight 215<< 204 on admission, soft blood pressures but stable at baseline values as recorded outpatient  [facility]    Labs: Basic Metabolic Panel:  Recent Labs Lab 08/06/15 0532 08/07/15 0835 08/08/15 0444 08/09/15 0324 08/09/15 1136  NA 130* 129* 130* 132* 133*  K 5.0 5.3* 5.1 5.3* 5.3*  CL 102 101 100* 100* 102  CO2 16* 16* 18* 16* 17*  GLUCOSE 140* 149* 128* 123* 159*  BUN 63* 65* 64* 73* 77*  CREATININE 3.58* 3.44* 3.42* 3.47* 3.59*  CALCIUM 8.1* 8.0* 8.2* 8.2* 8.4*  PHOS 4.9* 4.5  --   --  4.6   Liver Function Tests:  Recent Labs Lab 08/06/15 0532 08/07/15 0835 08/09/15 1136  ALBUMIN 2.1* 2.1* 2.3*   No results for input(s): LIPASE, AMYLASE in the last 168 hours. No results for input(s): AMMONIA in the last 168 hours. CBC:  Recent  Labs Lab 08/05/15 0642 08/06/15 0532 08/07/15 0835 08/08/15 0444 08/09/15 0324  WBC 15.9* 15.8* 17.4* 19.9* 19.9*  HGB 8.9* 8.4* 7.8* 7.9* 7.6*  HCT 27.6* 25.6* 24.3* 25.2* 23.7*  MCV 91.7 89.8 88.0 88.7 88.8  PLT 310 328 357 458* 464*   Cardiac Enzymes:  Recent Labs Lab 08/05/15 1656 08/08/15 0444 08/09/15 0324  CKTOTAL 449* 66 67   CBG:  Recent Labs Lab 08/08/15 1130 08/08/15 1627 08/08/15 2125 08/09/15 0802 08/09/15 1200  GLUCAP 147* 147* 145* 137* 144*    Iron Studies: No results for input(s): IRON, TIBC, TRANSFERRIN, FERRITIN in the last 72 hours. Studies/Results: Dg Chest 2 View  08/09/2015  CLINICAL DATA:  Shortness of breath. EXAM: CHEST  2 VIEW COMPARISON:  08/08/2015. FINDINGS: Mediastinum and hilar structures normal. Cardiomegaly again noted. Interim partial clearing of bilateral pulmonary infiltrates/edema. Small bilateral pleural effusions again noted. No pneumothorax . IMPRESSION: Congestive heart failure with interim partial clearing of pulmonary edema. Small bilateral pleural effusions. Electronically Signed   By: Marcello Moores  Register   On: 08/09/2015 08:18   Dg Chest Port 1 View  08/08/2015  CLINICAL DATA:  Shortness of breath EXAM: PORTABLE CHEST 1 VIEW COMPARISON:  08/01/2015 FINDINGS: Cardiac enlargement is identified. Increase in bilateral pleural effusions with progressive pulmonary edema. Aortic atherosclerosis  noted. No airspace consolidation identified. IMPRESSION: 1. Cardiac enlargement and CHF. Electronically Signed   By: Kerby Moors M.D.   On: 08/08/2015 07:24   Medications: Infusions:    Scheduled Medications: . [START ON 08/10/2015] amiodarone  200 mg Oral Daily  . antiseptic oral rinse  7 mL Mouth Rinse q12n4p  . aspirin EC  81 mg Oral Daily  . atorvastatin  80 mg Oral Daily  . buPROPion  300 mg Oral Daily  . chlorhexidine  15 mL Mouth Rinse BID  . doxercalciferol  2.5 mcg Oral Daily  . DULoxetine  60 mg Oral Daily  . fluticasone  2  spray Each Nare Daily  . gabapentin  600 mg Oral TID  . insulin aspart  0-15 Units Subcutaneous TID WC  . ipratropium-albuterol  3 mL Nebulization Q6H  . levothyroxine  112 mcg Oral QAC breakfast  . montelukast  10 mg Oral QHS  . nebivolol  2.5 mg Oral Daily  . pantoprazole  40 mg Oral Daily  . ranolazine  500 mg Oral BID  . roflumilast  500 mcg Oral Daily  . senna  1 tablet Oral BID  . sodium bicarbonate  650 mg Oral BID  . sodium chloride  500 mL Intravenous Once  . testosterone  5 g Transdermal Daily  . [START ON 08/24/2015] Vitamin D (Ergocalciferol)  50,000 Units Oral Q30 days    have reviewed scheduled and prn medications.  Physical Exam: General: NAD Heart: RRR Lungs: CTAB Abdomen: soft, non tender, +BS Extremities: no edema Dialysis Access: none   08/09/2015,2:58 PM  LOS: 8 days

## 2015-08-09 NOTE — Progress Notes (Signed)
Patient Name: Ricky Baker Date of Encounter: 08/09/2015   Primary Cardiologist: Dr. Meda Coffee.  80 yo male with prior medical history of PAF-on Eliquis 5 mg orally twice a day, coronary artery disease status post placement of 2 stents in 2013, COPD on home O2 at 2 liters, congestive heart failure, hypertension, IDDM, hyperlipidemia, diabetic neuropathy in legs presented with weakness, SOB, and pleuritic chest pain. Per patient he has been diagnosed at least twice with PNA this year. He was admitted on 3/10-3/13 with sepsis and PNA in [place]. He was treated with abx. He has had multiple outpatient visit this year. It appears he was sick again in Apr with fever and symptom concerning for PNA. CXR was obtained and he was treated again with Augmentin on 4/20. By the time he was seen on 4/26 as outpatient, he endorsed severe fatigue, fever and SOB with minimal exertion. He did not take lasix for 5 days because he feels so bad. He was admitted to [facility]. Lasix was initially held and later restarted. Hospital course complicated by acute on chronic renal insufficiency, nephrology consulted. He also finished a course of Rocephin IV. Had worsening AMS. Cardiology initially consulted on 08/02/2015 for atypical chest pain.     Principal Problem:   Pleuritic chest pain Active Problems:   Acquired autoimmune hypothyroidism   History of coronary artery disease   Major depressive disorder, recurrent episode, moderate (HCC)   Essential hypertension   PAF (paroxysmal atrial fibrillation) (HCC)   Type II diabetes mellitus, uncontrolled (HCC)   Chronic respiratory failure (HCC)   CKD (chronic kidney disease)   Leukocytosis   Hyperglycemia   Chronic renal insufficiency   Moderate aortic stenosis by prior echocardiogram   CAD S/P percutaneous coronary angioplasty   Chest pain   Acute on chronic diastolic CHF (congestive heart failure), NYHA class 1 ([place])    SUBJECTIVE  Feels bad, but no longer having chest  pain now. Only feels chest discomfort with deep inspiration. Does have SOB, but also states his SOB has been improving.   CURRENT MEDS . amiodarone  200 mg Oral BID  . antiseptic oral rinse  7 mL Mouth Rinse q12n4p  . aspirin EC  81 mg Oral Daily  . atorvastatin  80 mg Oral Daily  . buPROPion  300 mg Oral Daily  . chlorhexidine  15 mL Mouth Rinse BID  . doxercalciferol  2.5 mcg Oral Daily  . DULoxetine  60 mg Oral Daily  . fluticasone  2 spray Each Nare Daily  . gabapentin  600 mg Oral TID  . insulin aspart  0-15 Units Subcutaneous TID WC  . ipratropium-albuterol  3 mL Nebulization Q6H  . levothyroxine  112 mcg Oral QAC breakfast  . montelukast  10 mg Oral QHS  . nebivolol  2.5 mg Oral Daily  . pantoprazole  40 mg Oral Daily  . ranolazine  500 mg Oral BID  . roflumilast  500 mcg Oral Daily  . senna  1 tablet Oral BID  . sodium bicarbonate  650 mg Oral BID  . sodium chloride  500 mL Intravenous Once  . testosterone  5 g Transdermal Daily  . [START ON 08/24/2015] Vitamin D (Ergocalciferol)  50,000 Units Oral Q30 days    OBJECTIVE  Filed Vitals:   08/09/15 0600 08/09/15 0758 08/09/15 0852 08/09/15 1204  BP:  115/87  117/64  Pulse:  80  80  Temp:  98.4 F (36.9 C)  97.9 F (36.6 C)  TempSrc:  Oral  Oral  Resp:  18  15  Height:      Weight: 215 lb 9.8 oz (97.8 kg)     SpO2:  95% 96% 97%    Intake/Output Summary (Last 24 hours) at 08/09/15 1220 Last data filed at 08/09/15 0825  Gross per 24 hour  Intake    475 ml  Output    700 ml  Net   -225 ml   Filed Weights   08/05/15 0453 08/06/15 0500 08/09/15 0600  Weight: 213 lb 3 oz (96.7 kg) 209 lb 12.8 oz (95.165 kg) 215 lb 9.8 oz (97.8 kg)    PHYSICAL EXAM  General: drowsy Neuro: Alert and oriented. Moves all extremities spontaneously. Psych: Normal affect. HEENT:  Normal  Neck: Supple without bruits or JVD. Lungs:  Resp regular and unlabored. Diffusely diminished breath sound, worse in bilateral bases Heart: RRR  no s3, s4.  Abdomen: Soft, non-tender, non-distended, BS + x 4.  Extremities: No clubbing, cyanosis or edema. DP/PT/Radials 2+ and equal bilaterally.  Accessory Clinical Findings  CBC  Recent Labs  08/08/15 0444 08/09/15 0324  WBC 19.9* 19.9*  HGB 7.9* 7.6*  HCT 25.2* 23.7*  MCV 88.7 88.8  PLT 458* [id number]*   Basic Metabolic Panel  Recent Labs  08/07/15 0835 08/08/15 0444 08/09/15 0324  NA 129* 130* 132*  K 5.3* 5.1 5.3*  CL 101 100* 100*  CO2 16* 18* 16*  GLUCOSE 149* 128* 123*  BUN 65* 64* 73*  CREATININE 3.44* 3.42* 3.47*  CALCIUM 8.0* 8.2* 8.2*  PHOS 4.5  --   --    Liver Function Tests  Recent Labs  08/07/15 0835  ALBUMIN 2.1*   Cardiac Enzymes  Recent Labs  08/08/15 0444 08/09/15 0324  CKTOTAL 66 67    TELE NSR with 1 st degree AV block    ECG  NSR with 1st degree AV block  Echocardiogram 04/04/2015  LV EF: 55% - 60%  ------------------------------------------------------------------- Indications: 424.1 Aortic valve disorders.  ------------------------------------------------------------------- History: PMH: Chest pain. Syncope and murmur. Atrial fibrillation. Coronary artery disease. Aortic stenosis. Risk factors: Former tobacco use. Hypertension. Diabetes mellitus.  ------------------------------------------------------------------- Study Conclusions  - Left ventricle: The cavity size was normal. There was  moderate-severe concentric hypertrophy. Systolic function was  normal. The estimated ejection fraction was in the range of 55%  to 60%. Wall motion was normal; there were no regional wall  motion abnormalities. Doppler parameters are consistent with  abnormal left ventricular relaxation (grade 1 diastolic  dysfunction). - Aortic valve: There was mild stenosis. Valve area (VTI): 1.28  cm^2. Valve area (Vmax): 1.25 cm^2. Valve area (Vmean): 1.15  cm^2. - Mitral valve: Valve area by continuity  equation (using LVOT  flow): 2.56 cm^2. - Right ventricle: The cavity size was normal. Wall thickness was  normal. Systolic function was normal. - Atrial septum: There was increased thickness of the septum,  consistent with lipomatous hypertrophy. - Inferior vena cava: The vessel was normal in size. The  respirophasic diameter changes were in the normal range (>= 50%),  consistent with normal central venous pressure.    Radiology/Studies  Dg Chest 2 View  08/09/2015  CLINICAL DATA:  Shortness of breath. EXAM: CHEST  2 VIEW COMPARISON:  08/08/2015. FINDINGS: Mediastinum and hilar structures normal. Cardiomegaly again noted. Interim partial clearing of bilateral pulmonary infiltrates/edema. Small bilateral pleural effusions again noted. No pneumothorax . IMPRESSION: Congestive heart failure with interim partial clearing of pulmonary edema. Small bilateral pleural effusions. Electronically Signed   By: Marcello Moores  Register   On: 08/09/2015 08:18   Dg Chest 2 View  08/01/2015  CLINICAL DATA:  Chest pains and cough. Sore throat. History of esophageal cancer. EXAM: CHEST  2 VIEW COMPARISON:  07/26/2015 FINDINGS: Lungs are clear bilaterally. Retrocardiac density is compatible with history of esophageal cancer and previous gastric pull-through procedure. There is a nodular density in the right lower chest which was present on the exam from 09/21/2014 and may be related to a nipple shadow. Atherosclerotic calcifications in the thoracic aorta. IMPRESSION: No acute chest findings. Electronically Signed   By: Markus Daft M.D.   On: 08/01/2015 16:04   Dg Chest 2 View  07/26/2015  CLINICAL DATA:  Cough, fever, and chest congestion for the past week ; history of COPD, former smoker, history of pneumonia several months ago. EXAM: CHEST  2 VIEW COMPARISON:  PA and lateral chest x-ray of October 25, 2014 FINDINGS: The lungs remain mildly hyperinflated with hemidiaphragm flattening. The lung markings are coarse in the  right lower lobe posteriorly and are more conspicuous than on the previous study. The left lung is clear. The heart and pulmonary vascularity are normal. The mediastinum is normal in width. There is no pleural effusion. There is mild multilevel degenerative disc disease of the thoracic spine. IMPRESSION: COPD. Posterior left lower lobe infiltrate. There is no pleural effusion or evidence of CHF. Followup PA and lateral chest X-ray is recommended in 3-4 weeks following trial of antibiotic therapy to ensure resolution and exclude underlying malignancy. Electronically Signed   By: David  Martinique M.D.   On: 07/26/2015 12:23   Ct Chest Wo Contrast  08/04/2015  CLINICAL DATA:  Dyspnea. EXAM: CT CHEST WITHOUT CONTRAST TECHNIQUE: Multidetector CT imaging of the chest was performed following the standard protocol without IV contrast. COMPARISON:  Chest radiograph of August 01, 2015. CT scan of November 06, 2004. FINDINGS: No pneumothorax is noted. Minimal bilateral pleural effusions are noted with adjacent subsegmental atelectasis. Atherosclerosis of thoracic aorta is noted without aneurysm. Mild pericardial effusion is noted. Aberrant right subclavian artery is noted. Visualized portion of upper abdomen is unremarkable. Aortic valve calcifications are noted. Coronary artery calcifications are noted as well. No mediastinal adenopathy is noted. The esophagus is dilated and full of debris concerning for distal obstruction. The patient is status post esophageal surgery. IMPRESSION: Minimal bilateral posterior basilar pleural effusions are noted with adjacent subsegmental atelectasis. Atherosclerosis thoracic aorta is noted without aneurysm formation. Mild pericardial effusion is noted. Aortic valve calcifications are noted. Coronary artery calcifications are noted suggesting coronary artery disease. Dilated esophagus is noted full of debris concerning for distal obstruction. The patient is status post esophageal surgery.  Endoscopy is recommended for further evaluation. Electronically Signed   By: Marijo Conception, M.D.   On: 08/04/2015 15:08   US Renal  08/03/2015  CLINICAL DATA:  80 year old male with renal failure. EXAM: RENAL / URINARY TRACT ULTRASOUND COMPLETE COMPARISON:  Noncontrast CT 03/08/2015 FINDINGS: Right Kidney: Length: 11.4 cm. Echogenicity within normal limits. Mild thinning of renal parenchyma. No mass or hydronephrosis visualized. Lower pole calculi on prior CT are not visualized sonographically. Left Kidney: Length: 9.8 cm. Echogenicity within normal limits. There is thinning of renal parenchyma. No mass or hydronephrosis visualized. The cyst in the upper left kidney on prior CT is not visualized sonographically. Bladder: Decompressed by Foley catheter and not well evaluated. IMPRESSION: No obstructive uropathy. There is thinning of both renal parenchyma consistent with chronic medical renal disease. Electronically Signed   By: Threasa Beards  Ehinger M.D.   On: 08/03/2015 19:15   Dg Chest Port 1 View  08/08/2015  CLINICAL DATA:  Shortness of breath EXAM: PORTABLE CHEST 1 VIEW COMPARISON:  08/01/2015 FINDINGS: Cardiac enlargement is identified. Increase in bilateral pleural effusions with progressive pulmonary edema. Aortic atherosclerosis noted. No airspace consolidation identified. IMPRESSION: 1. Cardiac enlargement and CHF. Electronically Signed   By: Kerby Moors M.D.   On: 08/08/2015 07:24   Dg Abd 2 Views  08/02/2015  CLINICAL DATA:  Vomiting x 1 hour. Pt denies any abdominal pain. Recent dx last week of PNA. Hx multiple abdominal conditions and surgeries. EXAM: ABDOMEN - 2 VIEW COMPARISON:  CT, 03/08/2015 FINDINGS: Normal bowel gas pattern.  No free air. There is a vague calcification adjacent to the L3 spinous process measuring approximately 16 mm greatest dimension. This represents a small nonspecific calcified mesenteric lesion on the prior CT, likely an area remote fat necrosis. No evidence of renal  or ureteral stones. IMPRESSION: 1. No acute findings.  No evidence of obstruction or free air. Electronically Signed   By: Lajean Manes M.D.   On: 08/02/2015 19:40    ASSESSMENT AND PLAN  1. Chronic diastolic Heart Failure  - Echo 04/04/2015 EF 55-60%, no RWMA, grade 1 DD, mild AS.  - not convinced regarding fluid overload, he does have diminished breath sound in bilateral bases, but no JVD or LE edema. Will order BNP and trend Cr for now and hold off on diuresing him. Today's CXR reviewed, again, not convince about the degree of fluid overload  2. AMS: he had 2 episode of CAP recently, and treated with additional course of Rocephin during this admission.  - He appears to be very drowsy during interview, could barely answer any questions before falling asleep   - note he has both severe anemia and significant leukocytosis. Unclear cause of leukocytosis. He had a course of IV rocephin during this admission. ABG noted metabolic acidosis  3. Acute on chronic renal insufficiency  - nephrology was following  4. Pleuritic chest pain: trop flat, no further workup  5. PAF-on Eliquis 5 mg orally twice a day   - noted worsening anemia, hgb 11.5 in March, hold eliquis  6. CAD s/p2 stents in 2013  7. COPD on home O2 at 2 liters 8. Hypertension 9. IDDM 10. hyperlipidemia  Signed, Almyra Deforest PA-C Pager: [phone] As above, patient seen and examined. He is somewhat lethargic at the time of the evaluation but answers questions appropriately. He has mild dyspnea but denies chest pain. Examination somewhat difficult but shows no jugular venous distention, diminished breath sounds [id number] systolic murmur left sternal border and no pedal edema. Patient has chronic diastolic congestive heart failure. On examination he does not appear to be significantly volume overloaded. His renal function has deteriorated. We will hold Lasix for now but will need to resume prior to DC. Check BNP potassium and renal  function tomorrow morning. Diurese as needed. Nephrology also following for acute on chronic renal insufficiency. Also with metabolic acidosis. He does have a history of atrial fibrillation. Continue amiodarone but decrease to 200 mg daily. His hemoglobin has decreased and would hold anticoagulation until we make sure this is stable. Previous echocardiogram showed moderate aortic stenosis. LV function normal. Kirk Ruths

## 2015-08-09 NOTE — Progress Notes (Signed)
Patient ID: Ricky Baker, male   DOB: [birth year], 80 y.o.   MRN: [id number]    PROGRESS NOTE    Ricky Baker  [id number] DOB: 10/28/1935 DOA: 08/01/2015  PCP: Leeanne Rio, PA-C   Outpatient Specialists:   Brief Narrative:  79 y.o. male with paroxysmal atrial fibrillation on Eliquis, chronic diastolic CHF, COPD with home O2 requirement, chronic kidney disease stage III, Hypothyroidism, depression, and esophageal cancer status post resection in 2006 presented to the ED at [place] [place] [place] with chest pain of more than one week's duration.patient, PCP on 07/26/2015, diagnosed him with pneumonia based on chest x-ray, and treated with a course of Augmentin and prednisone.   ED Course: Upon arrival to the ED, patient was found to be afebrile, saturating well on 2 L/m, and with vital signs stable. Chest x-ray demonstrates resolution of the prior infiltrate, CBC with leukocytosis 21,400 and hemoglobin of 10.2.   Major events since admission: 5/3 - more dyspnea, CXR with vascular congestion, PCCM consulted  5/4 - intermittent confusion, more lethargic this AM  Assessment & Plan:  1. Acute encephalopathy - ? Uremia   - progressive decline and more frequent episodes of confusion  - will hold gabapentin until mental status improves   2. Acute respiratory distress / COPD / Pulmonary vascular congestion - see below  - Requiring supplemental O2 around-the-clock at baseline - given total 60 mg lasix 5/3 which seemed it helped with dyspnea, pt breathing more comfortably this AM - IVF and bicarb have been stopped since 5/2 - will hold off on Lasix for now until pt seen by cardiologist and nephrologist  - Continue DuoNeb, Singulair QD  - sister who os POA said that pt has not been taking Eliquis at home for several days due to malaise and poor oral intake - Eliquis was resumed initially but we held it since 5/2 due to drop in Hg - sister asked about V/Q scan to rule out PE,  which is reasonable but she made it clear she did not wants him on blood thinner - she wants to hear from cardiologist and nephrologist first  3. Acute on Chronic diastolic CHF  - weight trend since admission: 200 --> 208 --> 213 --> 201 lbs --> 209 lbs --> 215 lbs  - IVF have been stopped since 5/2 - gave 60 mg Lasix IV 5/3 and it seemed that it helped as pt less dyspneic this AM  - TTE (04/04/15) with EF 0000000, grade 1 diastolic dysfunction, mild AS  - PCCM recommended holding further lasix until renal team sees - ECHO pending, cardio also consulted for assistance   4. Paroxysmal atrial fibrillation - CHADS-VASc is 43 (age x2, CHF, DM) - pt on Eliquis but since Hg trending down, holding Eliquis since 5/2 - Continue rate-control with amiodarone, continue holding cardizem due to soft BP - pt already on home regimen Nebivolol, dose lowered to 2.5 mg PO QD due to lower BP   5. 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 Leukocytosis, anemia of chronic disease  - was on steroids recently but unclear why trending up  - has blood in urine but was determined to be due to foley cath placement  - may  need transfusion if Hg < 7 - certainly PE could be the cause of leukocytosis especially since off Eliquis - further testing based on cardio and nephro recommendations  - CBC in AM  7. Acute on CKD stage III, hyponatremia/ hyperkalemia/ metabolic acidosis  - SCr [id number] on admission, now ~ 3.4  - Avoiding nephrotoxins where possible  - gave does of Kayexalate this AM  - renal US unremarkable, nephrology team consulted, assistance is appreciated  - BMP in AM  8. Depression  - Continue current-dose Wellbutrin   9. Vomiting on admission  - abd XRAY unremarkable -  please note CT notable with dilated esophagus full of debris concerning for distal obstruction - d/w Dr. Henrene Pastor on call, no need for EGD at this time, pt actually tolerating diet well so far and vomiting has resolved - close outpatient follow up recommended with Dr. Pete Glatter  10. UTI - pt with chronic urinary retention from BPH, sees urologist - completed treatment with ABX Rocephin   11. Progressive decline and deconditioning - d/w family at length, they want to hear from cardiologist and nephrologist if pt would benefit from any more aggressive interventions such as HD - sister has living will but is not sure what pt's specific wishes are, she will bring this with her - offered PCT consultation as well, family to make decision after speaking with specialists   DVT prophylaxis: holding Eliquis due to low Hg Code Status: Full  Family Communication: Family at bedside  Disposition Plan: Needs SNF  Consultants:   GI over the phone   Nephrology team   PCCM  Cardiology   Procedures:   ECHO pending   Antimicrobials:  Rocephin completed   Subjective: More confusion this AM.  Objective: Filed Vitals:   08/09/15 0132 08/09/15 0600 08/09/15 0758 08/09/15 0852  BP:   115/87   Pulse:   80   Temp:   98.4 F (36.9 C)   TempSrc:   Oral   Resp:   18   Height:      Weight:  97.8 kg (215 lb 9.8 oz)    SpO2: 96%  95% 96%    Intake/Output Summary (Last 24 hours) at 08/09/15 1025 Last data filed at 08/09/15 0825  Gross per 24 hour  Intake    475 ml  Output    700 ml  Net   -225 ml   Filed Weights   08/05/15 0453 08/06/15 0500 08/09/15 0600  Weight: 96.7 kg (213 lb 3 oz) 95.165 kg (209 lb 12.8 oz) 97.8 kg (215 lb 9.8 oz)    Examination:  General exam: Appears confused but able to follow commands  Respiratory system: Diminished breath sounds at bases, crackles at bases  Cardiovascular system: S1 & S2 heard, RRR. SEM 2/6, No JVD, rubs, gallops or clicks. No pedal  edema. Gastrointestinal system: Abdomen is slightly distended, non tender  Central nervous system: Somnolent but easy to awake, intermittently confused but able to follow most of the commands, moving all 4 extremities spontaneously when awake   Data Reviewed: I have personally reviewed following labs and imaging studies  CBC:  Recent Labs Lab 08/05/15 0642 08/06/15 0532 08/07/15 0835 08/08/15 0444 08/09/15 0324  WBC 15.9* 15.8* 17.4* 19.9* 19.9*  HGB 8.9* 8.4* 7.8* 7.9* 7.6*  HCT 27.6* 25.6* 24.3* 25.2* 23.7*  MCV 91.7 89.8 88.0 88.7 88.8  PLT 310 328 357 458* [id number]*   Basic Metabolic Panel:  Recent Labs Lab 08/05/15 0642 08/05/15 1656 08/06/15 0532 08/07/15  [id number] 08/08/15 0444 08/09/15 0324  NA 131*  --  130* 129* 130* 132*  K 5.2*  --  5.0 5.3* 5.1 5.3*  CL 103  --  102 101 100* 100*  CO2 18*  --  16* 16* 18* 16*  GLUCOSE 124*  --  140* 149* 128* 123*  BUN 60*  --  63* 65* 64* 73*  CREATININE 3.71*  --  3.58* 3.44* 3.42* 3.47*  CALCIUM 8.2*  --  8.1* 8.0* 8.2* 8.2*  MG  --  1.9  --   --   --   --   PHOS  --   --  4.9* 4.5  --   --    Liver Function Tests:  Recent Labs Lab 08/06/15 0532 08/07/15 0835  ALBUMIN 2.1* 2.1*   Cardiac Enzymes:  Recent Labs Lab 08/02/15 1052 08/05/15 1656 08/08/15 0444 08/09/15 0324  CKTOTAL  --  449* 66 67  TROPONINI 0.09*  --   --   --    BNP (last 3 results)  Recent Labs  10/30/14 1056  PROBNP 117.0*   CBG:  Recent Labs Lab 08/08/15 0725 08/08/15 1130 08/08/15 1627 08/08/15 2125 08/09/15 0802  GLUCAP 116* 147* 147* 145* 137*    Radiology Studies: Dg Chest 2 View 08/01/2015   No acute chest findings.   Marland Kitchen amiodarone  200 mg Oral BID  . aspirin EC  81 mg Oral Daily  . atorvastatin  80 mg Oral Daily  . buPROPion  300 mg Oral Daily  . doxercalciferol  2.5 mcg Oral Daily  . DULoxetine  60 mg Oral Daily  . fluticasone  2 spray Each Nare Daily  . gabapentin  600 mg Oral TID  . insulin aspart  0-15 Units  Subcutaneous TID WC  . ipratropium-albuterol  3 mL Nebulization Q6H  . levothyroxine  112 mcg Oral QAC breakfast  . montelukast  10 mg Oral QHS  . nebivolol  2.5 mg Oral Daily  . pantoprazole  40 mg Oral Daily  . ranolazine  500 mg Oral BID  . roflumilast  500 mcg Oral Daily  . senna  1 tablet Oral BID  . sodium bicarbonate  650 mg Oral BID  . sodium polystyrene  30 g Oral Once  . testosterone  5 g Transdermal Daily     LOS: 8 days   Time spent: 20 minutes   Faye Ramsay, MD [facility] Pager [phone]  If 7PM-7AM, please contact night-coverage [email or web address] Password [email or web address] 08/09/2015, 10:25 AM

## 2015-08-10 ENCOUNTER — Inpatient Hospital Stay (HOSPITAL_COMMUNITY): Payer: Medicare Other

## 2015-08-10 DIAGNOSIS — N189 Chronic kidney disease, unspecified: Secondary | ICD-10-CM

## 2015-08-10 DIAGNOSIS — R0600 Dyspnea, unspecified: Secondary | ICD-10-CM

## 2015-08-10 LAB — CBC
HEMATOCRIT: 22.6 % — AB (ref 39.0–52.0)
HEMOGLOBIN: 7.2 g/dL — AB (ref 13.0–17.0)
MCH: 28.1 pg (ref 26.0–34.0)
MCHC: 31.9 g/dL (ref 30.0–36.0)
MCV: 88.3 fL (ref 78.0–100.0)
Platelets: 435 10*3/uL — ABNORMAL HIGH (ref 150–400)
RBC: 2.56 MIL/uL — ABNORMAL LOW (ref 4.22–5.81)
RDW: 14.5 % (ref 11.5–15.5)
WBC: 17.1 10*3/uL — AB (ref 4.0–10.5)

## 2015-08-10 LAB — URINE CULTURE
Culture: >=100000 — AB
Culture: 80000 — AB

## 2015-08-10 LAB — BASIC METABOLIC PANEL
ANION GAP: 14 (ref 5–15)
BUN: 71 mg/dL — ABNORMAL HIGH (ref 6–20)
CHLORIDE: 103 mmol/L (ref 101–111)
CO2: 18 mmol/L — AB (ref 22–32)
Calcium: 8.2 mg/dL — ABNORMAL LOW (ref 8.9–10.3)
Creatinine, Ser: 3.25 mg/dL — ABNORMAL HIGH (ref 0.61–1.24)
GFR calc non Af Amer: 17 mL/min — ABNORMAL LOW (ref >60)
GFR, EST AFRICAN AMERICAN: 20 mL/min — AB (ref >60)
Glucose, Bld: 169 mg/dL — ABNORMAL HIGH (ref 65–99)
POTASSIUM: 4.3 mmol/L (ref 3.5–5.1)
Sodium: 135 mmol/L (ref 135–145)

## 2015-08-10 LAB — PREPARE RBC (CROSSMATCH)

## 2015-08-10 LAB — ECHOCARDIOGRAM COMPLETE
Height: 71 in
Weight: 3355.2 oz

## 2015-08-10 LAB — GLUCOSE, CAPILLARY
GLUCOSE-CAPILLARY: 164 mg/dL — AB (ref 65–99)
GLUCOSE-CAPILLARY: 165 mg/dL — AB (ref 65–99)
Glucose-Capillary: 172 mg/dL — ABNORMAL HIGH (ref 65–99)
Glucose-Capillary: 190 mg/dL — ABNORMAL HIGH (ref 65–99)

## 2015-08-10 LAB — ABO/RH: ABO/RH(D): B POS

## 2015-08-10 MED ORDER — BISACODYL 10 MG RE SUPP
10.0000 mg | Freq: Every day | RECTAL | Status: DC | PRN
  Administered 2015-08-10: 10 mg via RECTAL
  Filled 2015-08-10: qty 1

## 2015-08-10 MED ORDER — PERFLUTREN LIPID MICROSPHERE
INTRAVENOUS | Status: AC
  Administered 2015-08-10: 2 mL via INTRAVENOUS
  Filled 2015-08-10: qty 10

## 2015-08-10 MED ORDER — ENSURE ENLIVE PO LIQD
237.0000 mL | Freq: Three times a day (TID) | ORAL | Status: DC
  Administered 2015-08-10 – 2015-08-12 (×4): 237 mL via ORAL

## 2015-08-10 MED ORDER — TECHNETIUM TC 99M DIETHYLENETRIAME-PENTAACETIC ACID: 30.2000 | Freq: Once | INTRAVENOUS | Status: DC | PRN

## 2015-08-10 MED ORDER — TECHNETIUM TO 99M ALBUMIN AGGREGATED
4.3000 | Freq: Once | INTRAVENOUS | Status: AC | PRN
  Administered 2015-08-10: 4 via INTRAVENOUS

## 2015-08-10 MED ORDER — IPRATROPIUM-ALBUTEROL 0.5-2.5 (3) MG/3ML IN SOLN
3.0000 mL | RESPIRATORY_TRACT | Status: DC | PRN
  Administered 2015-08-10 – 2015-08-11 (×2): 3 mL via RESPIRATORY_TRACT
  Filled 2015-08-10 (×2): qty 3

## 2015-08-10 MED ORDER — FUROSEMIDE 80 MG PO TABS
80.0000 mg | ORAL_TABLET | Freq: Every day | ORAL | Status: DC
  Administered 2015-08-10 – 2015-08-13 (×4): 80 mg via ORAL
  Filled 2015-08-10 (×4): qty 1

## 2015-08-10 MED ORDER — SODIUM CHLORIDE 0.9 % IV SOLN
Freq: Once | INTRAVENOUS | Status: AC
  Administered 2015-08-10: via INTRAVENOUS

## 2015-08-10 MED ORDER — FUROSEMIDE 10 MG/ML IJ SOLN
20.0000 mg | Freq: Once | INTRAMUSCULAR | Status: AC
  Administered 2015-08-10: 20 mg via INTRAVENOUS
  Filled 2015-08-10: qty 2

## 2015-08-10 MED ORDER — SODIUM CHLORIDE 0.9 % IV SOLN: Freq: Once | INTRAVENOUS | Status: DC

## 2015-08-10 NOTE — Progress Notes (Signed)
Subjective:  No acute concerns Objective Vital signs in last 24 hours: Filed Vitals:   08/10/15 0400 08/10/15 0500 08/10/15 0830 08/10/15 1050  BP: 109/65  103/84 112/62  Pulse:   83 81  Temp:  98.4 F (36.9 C) 97.7 F (36.5 C)   TempSrc:  Oral Oral   Resp: 16  18 18   Height:      Weight:  95.119 kg (209 lb 11.2 oz)    SpO2:  93% 94% 98%   Weight change: -2.681 kg (-5 lb 14.6 oz)  Intake/Output Summary (Last 24 hours) at 08/10/15 1334 Last data filed at 08/10/15 1212  Gross per 24 hour  Intake    357 ml  Output    950 ml  Net   -593 ml    Assessment/ Plan: Pt is a 80 y.o. yo male who was admitted on 08/01/2015 with acute on chronic kidney disease Assessment/Plan: 1. AoCKD/metabolic acidosis- Cr [id number] at admission. Consider diuresis as needed for CHF. We may be approaching new renal function baseline  2. Anemia- Hgb 7.2, management per primary team 3. HTN/volume- Weight 209<<215<< 204 on admission, soft blood pressures but stable at baseline values as recorded outpatient  [facility]   Renal Attending:  This is a challenging situation with cardiorenal dysfct.  He will need to stay on diuretics with azotemia given his CKD and tenuous volume status.  I will resume furosemide at 80mg  po daily beginning today. Sonora Catlin C   Labs: Basic Metabolic Panel:  Recent Labs Lab 08/06/15 0532 08/07/15 0835  08/09/15 0324 08/09/15 1136 08/10/15 0430  NA 130* 129*  < > 132* 133* 135  K 5.0 5.3*  < > 5.3* 5.3* 4.3  CL 102 101  < > 100* 102 103  CO2 16* 16*  < > 16* 17* 18*  GLUCOSE 140* 149*  < > 123* 159* 169*  BUN 63* 65*  < > 73* 77* 71*  CREATININE 3.58* 3.44*  < > 3.47* 3.59* 3.25*  CALCIUM 8.1* 8.0*  < > 8.2* 8.4* 8.2*  PHOS 4.9* 4.5  --   --  4.6  --   < > = values in this interval not displayed. Liver Function Tests:  Recent Labs Lab 08/06/15 0532 08/07/15 0835 08/09/15 1136  ALBUMIN 2.1* 2.1* 2.3*   No results for input(s): LIPASE, AMYLASE in  the last 168 hours. No results for input(s): AMMONIA in the last 168 hours. CBC:  Recent Labs Lab 08/06/15 0532 08/07/15 0835 08/08/15 0444 08/09/15 0324 08/10/15 0430  WBC 15.8* 17.4* 19.9* 19.9* 17.1*  HGB 8.4* 7.8* 7.9* 7.6* 7.2*  HCT 25.6* 24.3* 25.2* 23.7* 22.6*  MCV 89.8 88.0 88.7 88.8 88.3  PLT 328 357 458* 464* 435*   Cardiac Enzymes:  Recent Labs Lab 08/05/15 1656 08/08/15 0444 08/09/15 0324  CKTOTAL 449* 66 67   CBG:  Recent Labs Lab 08/09/15 1200 08/09/15 1650 08/09/15 2215 08/10/15 0728 08/10/15 1129  GLUCAP 144* 171* 189* 172* 164*    Iron Studies: No results for input(s): IRON, TIBC, TRANSFERRIN, FERRITIN in the last 72 hours. Studies/Results: Dg Chest 2 View  08/09/2015  CLINICAL DATA:  Shortness of breath. EXAM: CHEST  2 VIEW COMPARISON:  08/08/2015. FINDINGS: Mediastinum and hilar structures normal. Cardiomegaly again noted. Interim partial clearing of bilateral pulmonary infiltrates/edema. Small bilateral pleural effusions again noted. No pneumothorax . IMPRESSION: Congestive heart failure with interim partial clearing of pulmonary edema. Small bilateral pleural effusions. Electronically Signed   By: Marcello Moores  Register  On: 08/09/2015 08:18   Medications: Infusions:    Scheduled Medications: . amiodarone  200 mg Oral Daily  . antiseptic oral rinse  7 mL Mouth Rinse q12n4p  . atorvastatin  80 mg Oral Daily  . buPROPion  300 mg Oral Daily  . chlorhexidine  15 mL Mouth Rinse BID  . doxercalciferol  2.5 mcg Oral Daily  . DULoxetine  60 mg Oral Daily  . feeding supplement (ENSURE ENLIVE)  237 mL Oral BID BM  . fluticasone  2 spray Each Nare Daily  . gabapentin  600 mg Oral TID  . insulin aspart  0-15 Units Subcutaneous TID WC  . levothyroxine  112 mcg Oral QAC breakfast  . montelukast  10 mg Oral QHS  . nebivolol  2.5 mg Oral Daily  . pantoprazole  40 mg Oral Daily  . ranolazine  500 mg Oral BID  . roflumilast  500 mcg Oral Daily  . senna  1  tablet Oral BID  . sodium bicarbonate  650 mg Oral BID  . sodium chloride  500 mL Intravenous Once  . testosterone  5 g Transdermal Daily  . [START ON 08/24/2015] Vitamin D (Ergocalciferol)  50,000 Units Oral Q30 days    have reviewed scheduled and prn medications.  Physical Exam: General: NAD Heart: RRR Lungs: CTAB Abdomen: soft, non tender, +BS Extremities: no edema Dialysis Access: none   08/10/2015,1:34 PM  LOS: 9 days

## 2015-08-10 NOTE — Progress Notes (Signed)
Initial Nutrition Assessment  DOCUMENTATION CODES:   Not applicable  INTERVENTION:  -Ensure Enlive TID. Each supplement provides 350 kcals and 20 g of protein. -Magic Cup BID. Each supplement provides 290 kcals and 9 g of protein. -Continue to monitor for nutritional needs.   NUTRITION DIAGNOSIS:   Inadequate oral intake related to poor appetite as evidenced by per patient/family report, meal completion < 25%.  GOAL:   Patient will meet greater than or equal to 90% of their needs  MONITOR:   PO intake, Supplement acceptance, Labs, Weight trends, Skin, I & O's  REASON FOR ASSESSMENT:   Consult Poor PO  ASSESSMENT:   80 y.o. male with paroxysmal atrial fibrillation on Eliquis, chronic diastolic CHF, COPD with home O2 requirement, chronic kidney disease stage III,  Hypothyroidism, depression, and esophageal cancer status post resection in 2006 presented to the ED at [facility] [place] with chest pain of more than one week's duration.patient, PCP on 07/26/2015, diagnosed him with pneumonia based on chest x-ray, and treated with a course of Augmentin and prednisone.   Pt seen for c/s for poor PO's. Pt reports nausea prior to eating.   Pt consuming 0-20% of meals. Pt has experienced a 7% weight loss in the past 2 months which is significant for time frame.  Pt reports not knowing what will make him feel nauseas. RN reports pt refuses most meal trays.  Pt drank an Ensure today Pt is amenable to drinking Ensure and trying Magic Cup.  Will order Ensure TID and magic cup BID to supplement diet.   NFPE: no fat or muscle depletion, unable to assess edema.  Labs reviewed; BUN 71, creat 3.25, Ca 8.2, GFR 17, CBGs 137-189.  Meds reviewed; Vitamin D.   Diet Order:  Diet heart healthy/carb modified Room service appropriate?: Yes; Fluid consistency:: Thin; Fluid restriction:: 1500 mL Fluid  Skin:  Reviewed, no issues  Last BM:  4/25  Height:   Ht Readings from Last 1 Encounters:   08/01/15 5\' 11"  (1.803 m)    Weight:   Wt Readings from Last 1 Encounters:  08/10/15 209 lb 11.2 oz (95.119 kg)    Ideal Body Weight:  78 kg  BMI:  Body mass index is 29.26 kg/(m^2).  Estimated Nutritional Needs:   Kcal:  1700-1900 (18-20 kcals/kg)  Protein:  117 g (1.5 g/kg IBW)  Fluid:  1.7-1.9 L  EDUCATION NEEDS:   No education needs identified at this time  Geoffery Lyons, [facility] [employer] Pager [phone]

## 2015-08-10 NOTE — Progress Notes (Signed)
  Echocardiogram 2D Echocardiogram has been performed.  Ricky Baker 08/10/2015, 1:45 PM

## 2015-08-10 NOTE — Progress Notes (Signed)
Patient Name: Ricky Baker Date of Encounter: 08/10/2015   Primary Cardiologist: Dr. Meda Coffee.  80 yo male with prior medical history of PAF-on Eliquis 5 mg orally twice a day, coronary artery disease status post placement of 2 stents in 2013, COPD on home O2 at 2 liters, congestive heart failure, hypertension, IDDM, hyperlipidemia, diabetic neuropathy in legs presented with weakness, SOB, and pleuritic chest pain. Per patient he has been diagnosed at least twice with PNA this year. He was admitted on 3/10-3/13 with sepsis and PNA in [place]. He was treated with abx. He has had multiple outpatient visit this year. It appears he was sick again in Apr with fever and symptom concerning for PNA. CXR was obtained and he was treated again with Augmentin on 4/20. By the time he was seen on 4/26 as outpatient, he endorsed severe fatigue, fever and SOB with minimal exertion. He did not take lasix for 5 days because he feels so bad. He was admitted to [facility]. Lasix was initially held and later restarted. Hospital course complicated by acute on chronic renal insufficiency, nephrology consulted. He also finished a course of Rocephin IV. Had worsening AMS. Cardiology initially consulted on 08/02/2015 for atypical chest pain.     Principal Problem:   Pleuritic chest pain Active Problems:   Acquired autoimmune hypothyroidism   History of coronary artery disease   Major depressive disorder, recurrent episode, moderate (HCC)   Essential hypertension   PAF (paroxysmal atrial fibrillation) (HCC)   Type II diabetes mellitus, uncontrolled (HCC)   Chronic respiratory failure (HCC)   CKD (chronic kidney disease)   Leukocytosis   Hyperglycemia   Chronic renal insufficiency   Moderate aortic stenosis by prior echocardiogram   CAD S/P percutaneous coronary angioplasty   Chest pain   Acute on chronic diastolic CHF (congestive heart failure), NYHA class 1 ([place])    SUBJECTIVE  Still feels bad. Occasional chest  discomfort noted with deep inspiration. Says his breathing has not changed much since yesterday.   CURRENT MEDS . amiodarone  200 mg Oral Daily  . antiseptic oral rinse  7 mL Mouth Rinse q12n4p  . aspirin EC  81 mg Oral Daily  . atorvastatin  80 mg Oral Daily  . buPROPion  300 mg Oral Daily  . chlorhexidine  15 mL Mouth Rinse BID  . doxercalciferol  2.5 mcg Oral Daily  . DULoxetine  60 mg Oral Daily  . feeding supplement (ENSURE ENLIVE)  237 mL Oral BID BM  . fluticasone  2 spray Each Nare Daily  . gabapentin  600 mg Oral TID  . insulin aspart  0-15 Units Subcutaneous TID WC  . ipratropium-albuterol  3 mL Nebulization Q6H  . levothyroxine  112 mcg Oral QAC breakfast  . montelukast  10 mg Oral QHS  . nebivolol  2.5 mg Oral Daily  . pantoprazole  40 mg Oral Daily  . ranolazine  500 mg Oral BID  . roflumilast  500 mcg Oral Daily  . senna  1 tablet Oral BID  . sodium bicarbonate  650 mg Oral BID  . sodium chloride  500 mL Intravenous Once  . testosterone  5 g Transdermal Daily  . [START ON 08/24/2015] Vitamin D (Ergocalciferol)  50,000 Units Oral Q30 days    OBJECTIVE  Filed Vitals:   08/09/15 2034 08/10/15 0000 08/10/15 0400 08/10/15 0500  BP:  107/68 109/65   Pulse:  82    Temp:  98.5 F (36.9 C)  98.4 F (36.9 C)  TempSrc:  Oral  Oral  Resp:  16 16   Height:      Weight:    209 lb 11.2 oz (95.119 kg)  SpO2: 97% 95%  93%    Intake/Output Summary (Last 24 hours) at 08/10/15 0708 Last data filed at 08/10/15 0541  Gross per 24 hour  Intake    220 ml  Output   1600 ml  Net  -1380 ml   Filed Weights   08/06/15 0500 08/09/15 0600 08/10/15 0500  Weight: 209 lb 12.8 oz (95.165 kg) 215 lb 9.8 oz (97.8 kg) 209 lb 11.2 oz (95.119 kg)    PHYSICAL EXAM  General: drowsy Neuro: Alert and oriented. Moves all extremities spontaneously. Psych: Normal affect. HEENT:  Normal  Neck: Supple without bruits or JVD. Lungs:  Resp regular and unlabored. Diffusely diminished breath  sound, worse in bilateral bases Heart: RRR no s3, s4.  Abdomen: Soft, non-tender, non-distended, BS + x 4.  Extremities: No clubbing, cyanosis or edema. DP/PT/Radials 2+ and equal bilaterally.  Accessory Clinical Findings  CBC  Recent Labs  08/09/15 0324 08/10/15 0430  WBC 19.9* 17.1*  HGB 7.6* 7.2*  HCT 23.7* 22.6*  MCV 88.8 88.3  PLT 464* [id number]*   Basic Metabolic Panel  Recent Labs  08/07/15 0835  08/09/15 1136 08/10/15 0430  NA 129*  < > 133* 135  K 5.3*  < > 5.3* 4.3  CL 101  < > 102 103  CO2 16*  < > 17* 18*  GLUCOSE 149*  < > 159* 169*  BUN 65*  < > 77* 71*  CREATININE 3.44*  < > 3.59* 3.25*  CALCIUM 8.0*  < > 8.4* 8.2*  PHOS 4.5  --  4.6  --   < > = values in this interval not displayed. Liver Function Tests  Recent Labs  08/07/15 0835 08/09/15 1136  ALBUMIN 2.1* 2.3*   Cardiac Enzymes  Recent Labs  08/08/15 0444 08/09/15 0324  CKTOTAL 66 67    TELE NSR with 1 st degree AV block    ECG  No new EKG  Echocardiogram 04/04/2015  LV EF: 55% - 60%  ------------------------------------------------------------------- Indications: 424.1 Aortic valve disorders.  ------------------------------------------------------------------- History: PMH: Chest pain. Syncope and murmur. Atrial fibrillation. Coronary artery disease. Aortic stenosis. Risk factors: Former tobacco use. Hypertension. Diabetes mellitus.  ------------------------------------------------------------------- Study Conclusions  - Left ventricle: The cavity size was normal. There was  moderate-severe concentric hypertrophy. Systolic function was  normal. The estimated ejection fraction was in the range of 55%  to 60%. Wall motion was normal; there were no regional wall  motion abnormalities. Doppler parameters are consistent with  abnormal left ventricular relaxation (grade 1 diastolic  dysfunction). - Aortic valve: There was mild stenosis. Valve area  (VTI): 1.28  cm^2. Valve area (Vmax): 1.25 cm^2. Valve area (Vmean): 1.15  cm^2. - Mitral valve: Valve area by continuity equation (using LVOT  flow): 2.56 cm^2. - Right ventricle: The cavity size was normal. Wall thickness was  normal. Systolic function was normal. - Atrial septum: There was increased thickness of the septum,  consistent with lipomatous hypertrophy. - Inferior vena cava: The vessel was normal in size. The  respirophasic diameter changes were in the normal range (>= 50%),  consistent with normal central venous pressure.    Radiology/Studies  Dg Chest 2 View  08/09/2015  CLINICAL DATA:  Shortness of breath. EXAM: CHEST  2 VIEW COMPARISON:  08/08/2015. FINDINGS: Mediastinum and hilar structures normal. Cardiomegaly again noted. Interim  partial clearing of bilateral pulmonary infiltrates/edema. Small bilateral pleural effusions again noted. No pneumothorax . IMPRESSION: Congestive heart failure with interim partial clearing of pulmonary edema. Small bilateral pleural effusions. Electronically Signed   By: Marcello Moores  Register   On: 08/09/2015 08:18   Dg Chest 2 View  08/01/2015  CLINICAL DATA:  Chest pains and cough. Sore throat. History of esophageal cancer. EXAM: CHEST  2 VIEW COMPARISON:  07/26/2015 FINDINGS: Lungs are clear bilaterally. Retrocardiac density is compatible with history of esophageal cancer and previous gastric pull-through procedure. There is a nodular density in the right lower chest which was present on the exam from 09/21/2014 and may be related to a nipple shadow. Atherosclerotic calcifications in the thoracic aorta. IMPRESSION: No acute chest findings. Electronically Signed   By: Markus Daft M.D.   On: 08/01/2015 16:04   Dg Chest 2 View  07/26/2015  CLINICAL DATA:  Cough, fever, and chest congestion for the past week ; history of COPD, former smoker, history of pneumonia several months ago. EXAM: CHEST  2 VIEW COMPARISON:  PA and lateral chest x-ray of  October 25, 2014 FINDINGS: The lungs remain mildly hyperinflated with hemidiaphragm flattening. The lung markings are coarse in the right lower lobe posteriorly and are more conspicuous than on the previous study. The left lung is clear. The heart and pulmonary vascularity are normal. The mediastinum is normal in width. There is no pleural effusion. There is mild multilevel degenerative disc disease of the thoracic spine. IMPRESSION: COPD. Posterior left lower lobe infiltrate. There is no pleural effusion or evidence of CHF. Followup PA and lateral chest X-ray is recommended in 3-4 weeks following trial of antibiotic therapy to ensure resolution and exclude underlying malignancy. Electronically Signed   By: David  Martinique M.D.   On: 07/26/2015 12:23   Ct Chest Wo Contrast  08/04/2015  CLINICAL DATA:  Dyspnea. EXAM: CT CHEST WITHOUT CONTRAST TECHNIQUE: Multidetector CT imaging of the chest was performed following the standard protocol without IV contrast. COMPARISON:  Chest radiograph of August 01, 2015. CT scan of November 06, 2004. FINDINGS: No pneumothorax is noted. Minimal bilateral pleural effusions are noted with adjacent subsegmental atelectasis. Atherosclerosis of thoracic aorta is noted without aneurysm. Mild pericardial effusion is noted. Aberrant right subclavian artery is noted. Visualized portion of upper abdomen is unremarkable. Aortic valve calcifications are noted. Coronary artery calcifications are noted as well. No mediastinal adenopathy is noted. The esophagus is dilated and full of debris concerning for distal obstruction. The patient is status post esophageal surgery. IMPRESSION: Minimal bilateral posterior basilar pleural effusions are noted with adjacent subsegmental atelectasis. Atherosclerosis thoracic aorta is noted without aneurysm formation. Mild pericardial effusion is noted. Aortic valve calcifications are noted. Coronary artery calcifications are noted suggesting coronary artery disease.  Dilated esophagus is noted full of debris concerning for distal obstruction. The patient is status post esophageal surgery. Endoscopy is recommended for further evaluation. Electronically Signed   By: Marijo Conception, M.D.   On: 08/04/2015 15:08   US Renal  08/03/2015  CLINICAL DATA:  80 year old male with renal failure. EXAM: RENAL / URINARY TRACT ULTRASOUND COMPLETE COMPARISON:  Noncontrast CT 03/08/2015 FINDINGS: Right Kidney: Length: 11.4 cm. Echogenicity within normal limits. Mild thinning of renal parenchyma. No mass or hydronephrosis visualized. Lower pole calculi on prior CT are not visualized sonographically. Left Kidney: Length: 9.8 cm. Echogenicity within normal limits. There is thinning of renal parenchyma. No mass or hydronephrosis visualized. The cyst in the upper left kidney on prior  CT is not visualized sonographically. Bladder: Decompressed by Foley catheter and not well evaluated. IMPRESSION: No obstructive uropathy. There is thinning of both renal parenchyma consistent with chronic medical renal disease. Electronically Signed   By: Jeb Levering M.D.   On: 08/03/2015 19:15   Dg Chest Port 1 View  08/08/2015  CLINICAL DATA:  Shortness of breath EXAM: PORTABLE CHEST 1 VIEW COMPARISON:  08/01/2015 FINDINGS: Cardiac enlargement is identified. Increase in bilateral pleural effusions with progressive pulmonary edema. Aortic atherosclerosis noted. No airspace consolidation identified. IMPRESSION: 1. Cardiac enlargement and CHF. Electronically Signed   By: Kerby Moors M.D.   On: 08/08/2015 07:24   Dg Abd 2 Views  08/02/2015  CLINICAL DATA:  Vomiting x 1 hour. Pt denies any abdominal pain. Recent dx last week of PNA. Hx multiple abdominal conditions and surgeries. EXAM: ABDOMEN - 2 VIEW COMPARISON:  CT, 03/08/2015 FINDINGS: Normal bowel gas pattern.  No free air. There is a vague calcification adjacent to the L3 spinous process measuring approximately 16 mm greatest dimension. This represents  a small nonspecific calcified mesenteric lesion on the prior CT, likely an area remote fat necrosis. No evidence of renal or ureteral stones. IMPRESSION: 1. No acute findings.  No evidence of obstruction or free air. Electronically Signed   By: Lajean Manes M.D.   On: 08/02/2015 19:40    ASSESSMENT AND PLAN  1. Chronic diastolic Heart Failure  - Echo 04/04/2015 EF 55-60%, no RWMA, grade 1 DD, mild AS.  - not convinced regarding fluid overload, he does have diminished breath sound in bilateral bases, but no JVD or LE edema. Only mild AS on recent Echo.   - BNP 919 on 08/09/2015 up from 483.1 on 4/26, he still does not look volume overloaded, consider restart 40mg  PO lasix daily, but would not be too aggressive with diuresis as physical exam does not seems to support significant volume overload. Despite not on lasix right now, he had good urinary out of 1.6 Liters in the last 24 hours. Will order SCD as patient has been off eliquis for 2-3 days and is currently bedbound and not on any VTE prophylaxis.  2. AMS: he had 2 episode of CAP recently, and treated with additional course of Rocephin during this admission.  - More alert this morning, continue to have significant anemia and elevated WBC. Unclear cause of leukocytosis. He had a course of IV rocephin during this admission. ABG noted metabolic acidosis  3. Acute on chronic renal insufficiency  - nephrology was following  4. Pleuritic chest pain: trop flat, no further workup  5. PAF-on Eliquis 5 mg orally twice a day   - noted worsening anemia, hgb 11.5 in March, hold eliquis  6. CAD s/p2 stents in 2013  7. COPD on home O2 at 2 liters 8. Hypertension 9. IDDM 10. Hyperlipidemia 11. Acute on chronic anemia: eliquis on hold, hgb 11.5 in March 2017, now 7.2. Large hgb on urinalysis  - likely will require PRBC transfusion given trop dropping down to 7.2  Signed, Almyra Deforest PA-C Pager: [phone] As above, patient seen and examined. He has mild  dyspnea. Mild chest pain with deep inspiration. Complains of weakness. He remains in sinus rhythm. Continue amiodarone. We are holding anticoagulation given significant anemia. We will resume when it is clear blood counts are stable and patient not bleeding. His hemoglobin is now 7.2 and certainly could be contributing to his weakness. Would transfuse 2 units PRBCs. Would give Lasix 40 mg between units.  He also needs physical therapy because of significant deconditioning. At present he appears to be euvolemic. Will hold daily Lasix and likely resume 40 mg daily tomorrow. Nephrology following for renal insufficiency. Kirk Ruths

## 2015-08-10 NOTE — Progress Notes (Signed)
Pt More alert today, less confused. Pt states, "I just feel very weak." Hgb 7.2 today. MD made aware.  Pt has extremely poor PO intake. Bringing pt strawberry Ensure to see if he will drink that. Consult to dietician ordered.

## 2015-08-10 NOTE — Progress Notes (Signed)
Patient ID: Ricky Baker, male   DOB: 13-Feb-1936, 80 y.o.   MRN: [id number]    PROGRESS NOTE    Ricky Baker  [id number] DOB: [birth year] DOA: 08/01/2015  PCP: Leeanne Rio, PA-C   Outpatient Specialists:   Brief Narrative:  80 y.o. male with paroxysmal atrial fibrillation on Eliquis, chronic diastolic CHF, COPD with home O2 requirement, chronic kidney disease stage III, Hypothyroidism, depression, and esophageal cancer status post resection in 2006 presented to the ED at [place] [place] with chest pain of more than one week's duration.patient, PCP on 07/26/2015, diagnosed him with pneumonia based on chest x-ray, and treated with a course of Augmentin and prednisone.   ED Course: Upon arrival to the ED, patient was found to be afebrile, saturating well on 2 L/m, and with vital signs stable. Chest x-ray demonstrates resolution of the prior infiltrate, CBC with leukocytosis 21,400 and hemoglobin of 10.2.   Major events since admission: 5/3 - more dyspnea, CXR with vascular congestion, PCCM consulted  5/4 - intermittent confusion, more lethargic this AM  Assessment & Plan:  1. Acute encephalopathy, resolved - had progressive decline and more frequent episodes of confusion  - gabapentin held and better mental status, will not restart for now  2. Acute respiratory distress / COPD / Pulmonary vascular congestion - see below  - Requiring supplemental O2 around-the-clock at baseline; 2L - given total 60 mg lasix 5/3 which seemed it helped with dyspnea, pt breathing more comfortably this AM - IVF and bicarb have been stopped since 5/2 - will hold off on Lasix for now until pt seen by cardiologist and nephrologist  - Continue DuoNeb, Singulair QD  - sister who os POA said that pt has not been taking Eliquis at home for several days due to malaise and poor oral intake - Eliquis was resumed initially but we held it since 5/2 due to drop in Hg - sister asked about V/Q scan  to rule out PE, which is reasonable but she made it clear she did not wants him on blood thinner - she wants to hear from cardiologist and nephrologist first; still waiting for her to speak to specialits  3. Acute on Chronic diastolic CHF  - weight trend since admission: 200 --> 208 --> 213 --> 201 lbs --> 209 lbs --> 215 lbs -->209lbs today - IVF have been stopped since 5/2 - gave 60 mg Lasix IV 5/3 and it seemed that it helped as pt less dyspneic this AM  - TTE (04/04/15) with EF 0000000, grade 1 diastolic dysfunction, mild AS  - PCCM recommended holding further lasix until renal team sees - ECHO pending, cardio also consulted for assistance   4. Paroxysmal atrial fibrillation - CHADS-VASc is 71 (age x2, CHF, DM) - pt on Eliquis but since Hg trending down, holding Eliquis since 5/2 - Continue rate-control with amiodarone, continue holding cardizem due to soft BP - pt already on home regimen Nebivolol, dose lowered to 2.5 mg PO QD due to lower BP   5. 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 Leukocytosis, anemia of chronic disease  - was on steroids recently but unclear why was trending up; now on downward trend  - has blood  in urine but was determined to be due to foley cath placement  - transfusing today 2u with with dose of lasix inbetween - certainly PE could be the cause of leukocytosis especially since off Eliquis - further testing based on cardio and nephro recommendations  - CBC in AM  7. Acute on CKD stage III, hyponatremia/ hyperkalemia/ metabolic acidosis  - SCr [id number] on admission, now ~ 3.25 (slight improvement, maybe more improvement after blood)  - Avoiding nephrotoxins where possible  - gave does of Kayexalate 5/4 - renal US unremarkable,  nephrology team consulted, assistance is appreciated  - BMP in AM  8. Depression  - Continue current-dose Wellbutrin   9. Vomiting on admission  - abd XRAY unremarkable - please note CT notable with dilated esophagus full of debris concerning for distal obstruction - d/w Dr. Henrene Pastor on call, no need for EGD at this time, pt actually tolerating diet well so far and vomiting has resolved - close outpatient follow up recommended with Dr. Pete Glatter  10. UTI - pt with chronic urinary retention from BPH, sees urologist - completed treatment with ABX Rocephin   11. Progressive decline and deconditioning - d/w family at length, they want to hear from cardiologist and nephrologist if pt would benefit from any more aggressive interventions such as HD - sister has living will but is not sure what pt's specific wishes are, she will bring this with her - offered PCT consultation as well, family to make decision after speaking with specialists   DVT prophylaxis: holding Eliquis due to low Hg Code Status: Full  Family Communication: not seen today Disposition Plan: Needs SNF  Consultants:   GI over the phone   Nephrology team   PCCM  Cardiology   Procedures:   ECHO pending   Antimicrobials:  Rocephin completed   Subjective: Appropriate this AM. Discussed risks and benefits of transfusion, pt VU. C/o being weak. States he needs help on d/c. Wants placement.   Objective: Filed Vitals:   08/10/15 0000 08/10/15 0400 08/10/15 0500 08/10/15 0830  BP: 107/68 109/65  103/84  Pulse: 82   83  Temp: 98.5 F (36.9 C)  98.4 F (36.9 C) 97.7 F (36.5 C)  TempSrc: Oral  Oral Oral  Resp: 16 16  18   Height:      Weight:   95.119 kg (209 lb 11.2 oz)   SpO2: 95%  93% 94%    Intake/Output Summary (Last 24 hours) at 08/10/15 1045 Last data filed at 08/10/15 0900  Gross per 24 hour  Intake    120 ml  Output   1050 ml  Net   -930 ml   Filed Weights   08/06/15 0500 08/09/15 0600  08/10/15 0500  Weight: 95.165 kg (209 lb 12.8 oz) 97.8 kg (215 lb 9.8 oz) 95.119 kg (209 lb 11.2 oz)    Examination:  General exam: NAD, A&Ox3 Respiratory system: Diminished breath sounds at bases, crackles at bases  Cardiovascular system: S1 & S2 heard, RRR. SEM 2/6, No JVD, rubs, gallops or clicks. No pedal edema. Gastrointestinal system: Abdomen is slightly distended, NTTP Central nervous system: Appropriate, moving all 4 extremities spontaneously when awake   Data Reviewed: I have personally reviewed following labs and imaging studies  CBC:  Recent Labs Lab 08/06/15 0532 08/07/15 0835 08/08/15 0444 08/09/15 0324 08/10/15 0430  WBC 15.8* 17.4* 19.9* 19.9* 17.1*  HGB 8.4* 7.8* 7.9* 7.6* 7.2*  HCT 25.6* 24.3* 25.2* 23.7* 22.6*  MCV 89.8 88.0 88.7 88.8 88.3  PLT 328 357 458* 464* [id number]*   Basic Metabolic Panel:  Recent Labs Lab 08/05/15 1656 08/06/15 0532 08/07/15 0835 08/08/15 0444 08/09/15 0324 08/09/15 1136 08/10/15 0430  NA  --  130* 129* 130* 132* 133* 135  K  --  5.0 5.3* 5.1 5.3* 5.3* 4.3  CL  --  102 101 100* 100* 102 103  CO2  --  16* 16* 18* 16* 17* 18*  GLUCOSE  --  140* 149* 128* 123* 159* 169*  BUN  --  63* 65* 64* 73* 77* 71*  CREATININE  --  3.58* 3.44* 3.42* 3.47* 3.59* 3.25*  CALCIUM  --  8.1* 8.0* 8.2* 8.2* 8.4* 8.2*  MG 1.9  --   --   --   --   --   --   PHOS  --  4.9* 4.5  --   --  4.6  --    Liver Function Tests:  Recent Labs Lab 08/06/15 0532 08/07/15 0835 08/09/15 1136  ALBUMIN 2.1* 2.1* 2.3*   Cardiac Enzymes:  Recent Labs Lab 08/05/15 1656 08/08/15 0444 08/09/15 0324  CKTOTAL 449* 66 67   BNP (last 3 results)  Recent Labs  10/30/14 1056  PROBNP 117.0*   CBG:  Recent Labs Lab 08/09/15 0802 08/09/15 1200 08/09/15 1650 08/09/15 2215 08/10/15 0728  GLUCAP 137* 144* 171* 189* 172*    Radiology Studies: Dg Chest 2 View 08/01/2015   No acute chest findings.   Marland Kitchen amiodarone  200 mg Oral BID  . aspirin EC  81 mg  Oral Daily  . atorvastatin  80 mg Oral Daily  . buPROPion  300 mg Oral Daily  . doxercalciferol  2.5 mcg Oral Daily  . DULoxetine  60 mg Oral Daily  . fluticasone  2 spray Each Nare Daily  . gabapentin  600 mg Oral TID  . insulin aspart  0-15 Units Subcutaneous TID WC  . ipratropium-albuterol  3 mL Nebulization Q6H  . levothyroxine  112 mcg Oral QAC breakfast  . montelukast  10 mg Oral QHS  . nebivolol  2.5 mg Oral Daily  . pantoprazole  40 mg Oral Daily  . ranolazine  500 mg Oral BID  . roflumilast  500 mcg Oral Daily  . senna  1 tablet Oral BID  . sodium bicarbonate  650 mg Oral BID  . sodium polystyrene  30 g Oral Once  . testosterone  5 g Transdermal Daily     LOS: 9 days   Time spent: 20 minutes   Elwin Mocha, MD [facility] Pager [phone]  If 7PM-7AM, please contact night-coverage [email or web address] Password [email or web address] 08/10/2015, 10:45 AM

## 2015-08-10 NOTE — Care Management Important Message (Signed)
Important Message  Patient Details  Name: Ricky Baker MRN: [id number] Date of Birth: Jun 20, 1935   Medicare Important Message Given:  Yes    Lashya Passe [place] 08/10/2015, 3:06 PM

## 2015-08-10 NOTE — Clinical Social Work Note (Signed)
Report left for weekend CSWs. The patient's family prefers [facility]. CSW has made facility aware. [facility] asks for updates closer to time of patient's DC.    Liz Beach MSW, [place], [place], [id number]

## 2015-08-11 DIAGNOSIS — R0600 Dyspnea, unspecified: Secondary | ICD-10-CM | POA: Insufficient documentation

## 2015-08-11 LAB — CBC
HEMATOCRIT: 29.5 % — AB (ref 39.0–52.0)
HEMOGLOBIN: 9.7 g/dL — AB (ref 13.0–17.0)
MCH: 28.6 pg (ref 26.0–34.0)
MCHC: 32.9 g/dL (ref 30.0–36.0)
MCV: 87 fL (ref 78.0–100.0)
Platelets: 369 10*3/uL (ref 150–400)
RBC: 3.39 MIL/uL — AB (ref 4.22–5.81)
RDW: 14.6 % (ref 11.5–15.5)
WBC: 16.8 10*3/uL — AB (ref 4.0–10.5)

## 2015-08-11 LAB — OCCULT BLOOD X 1 CARD TO LAB, STOOL: Fecal Occult Bld: POSITIVE — AB

## 2015-08-11 LAB — GLUCOSE, CAPILLARY
GLUCOSE-CAPILLARY: 158 mg/dL — AB (ref 65–99)
GLUCOSE-CAPILLARY: 158 mg/dL — AB (ref 65–99)
Glucose-Capillary: 156 mg/dL — ABNORMAL HIGH (ref 65–99)
Glucose-Capillary: 147 mg/dL — ABNORMAL HIGH (ref 65–99)

## 2015-08-11 MED ORDER — HEPARIN SODIUM (PORCINE) 5000 UNIT/ML IJ SOLN
5000.0000 [IU] | Freq: Three times a day (TID) | INTRAMUSCULAR | Status: DC
  Administered 2015-08-11 – 2015-08-13 (×6): 5000 [IU] via SUBCUTANEOUS
  Filled 2015-08-11 (×6): qty 1

## 2015-08-11 MED ORDER — PANTOPRAZOLE SODIUM 40 MG PO TBEC
40.0000 mg | DELAYED_RELEASE_TABLET | Freq: Two times a day (BID) | ORAL | Status: DC
  Administered 2015-08-11 – 2015-08-13 (×6): 40 mg via ORAL
  Filled 2015-08-11 (×6): qty 1

## 2015-08-11 NOTE — Progress Notes (Signed)
Patient ID: Ricky Baker, male   DOB: 08-13-1935, 80 y.o.   MRN: [id number]    PROGRESS NOTE    Ricky Baker  [id number] DOB: 1935-08-21 DOA: 08/01/2015  PCP: Leeanne Rio, PA-C   Outpatient Specialists:   Brief Narrative:  80 y.o. male with paroxysmal atrial fibrillation on Eliquis, chronic diastolic CHF, COPD with home O2 requirement, chronic kidney disease stage III, Hypothyroidism, depression, and esophageal cancer status post resection in 2006 presented to the ED at [place] [place] with chest pain of more than one week's duration.patient, PCP on 07/26/2015, diagnosed him with pneumonia based on chest x-ray, and treated with a course of Augmentin and prednisone.   ED Course: Upon arrival to the ED, patient was found to be afebrile, saturating well on 2 L/m, and with vital signs stable. Chest x-ray demonstrates resolution of the prior infiltrate, CBC with leukocytosis 21,400 and hemoglobin of 10.2.   Major events since admission: 5/3 - more dyspnea, CXR with vascular congestion, PCCM consulted  5/4 - intermittent confusion, more lethargic this AM 5/6 - patient more clear mentally, very alert & answering questions appropriately  Assessment & Plan:  1. Acute encephalopathy, resolved - had progressive decline and more frequent episodes of confusion  - gabapentin held and better mental status, will not restart for now  2. Acute respiratory distress / COPD / Pulmonary vascular congestion - see below  - Requiring supplemental O2 around-the-clock at baseline; 2L - given total 60 mg lasix 5/3 which seemed it helped with dyspnea, pt breathing more comfortably this AM - IVF and bicarb have been stopped since 5/2 - Lasix restarted by nephro today 5/5 - Continue DuoNeb, Singulair QD  - sister who os POA said that pt has not been taking Eliquis at home for several days due to malaise and poor oral intake - Eliquis was resumed initially but we held it since 5/2 due to  drop in Hg - sister asked about V/Q scan to rule out PE, which is reasonable but she made it clear she did not wants him on blood thinner - she wants to hear from cardiologist and nephrologist first; still waiting for her to speak to specialits -Family to come in tomorrow which is Sunday to speak to palliative care, cardio and nephrology  3. Acute on Chronic diastolic CHF  - weight trend since admission: 200 --> 208 --> 213 --> 201 --> 209  --> 215 -->209-->213today today - IVF have been stopped since 5/2 - gave 60 mg Lasix IV 5/3 and it seemed that it helped as pt less dyspneic this AM  - TTE (04/04/15) with EF 0000000, grade 1 diastolic dysfunction, mild AS  - earlier PCCM recommended holding further lasix until renal team sees - ECHO see below, aortic stenosis seen. Normal ejection fraction.  4. Paroxysmal atrial fibrillation - CHADS-VASc is 69 (age x2, CHF, DM) - pt on Eliquis but since Hg trending down, holding Eliquis since 5/2, marked improvement in anemia today - Continue rate-control with amiodarone, continue holding cardizem due to soft BP - pt already on home regimen Nebivolol, dose lowered to 2.5 mg PO QD due to lower BP   5. 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 Leukocytosis,  anemia of chronic disease  - was on steroids recently but unclear why was trending up; now on downward trend again today - has blood in urine but was determined to be due to foley cath placement  - transfusing today 2u with with dose of lasix inbetween - certainly PE could be the cause of leukocytosis especially since off Eliquis but nuclear medicine scan was negative - CBC in AM  7. Acute on CKD stage III, hyponatremia/ hyperkalemia/ metabolic acidosis  - SCr [id number] on  admission, last check ~ 3.25 (slight improvement, maybe more improvement after blood)  - Avoiding nephrotoxins where possible  - gave does of Kayexalate 5/4 - renal US unremarkable, nephrology team consulted, assistance is appreciated  - BMP in AM  8. Depression  - Continue current-dose Wellbutrin   9. Vomiting on admission  - abd XRAY unremarkable - please note CT notable with dilated esophagus full of debris concerning for distal obstruction - d/w Dr. Henrene Pastor on call, no need for EGD at this time, pt actually tolerating diet well so far and vomiting has resolved - close outpatient follow up recommended with Dr. Pete Glatter  10. UTI - pt with chronic urinary retention from BPH, sees urologist - completed treatment with ABX Rocephin   11. Progressive decline and deconditioning - d/w family at length, they want to hear from cardiologist and nephrologist if pt would benefit from any more aggressive interventions such as HD - sister has living will but is not sure what pt's specific wishes are, she will bring this with her - offered PCT consultation as well, family will come in tomorrow, sensation -Patient to go to sniffle discharge  DVT prophylaxis: holding Eliquis due to low Hg Code Status: Full  Family Communication: Spoke to family at bedside today Disposition Plan: Needs SNF  Consultants:   GI over the phone   Nephrology team   PCCM  Cardiology   Procedures:  ECHO Study Conclusions  - Left ventricle: The cavity size was normal. Systolic function was  normal. The estimated ejection fraction was in the range of 60%  to 65%. Wall motion was normal; there were no regional wall  motion abnormalities. - Aortic valve: Severe diffuse thickening and calcification,  consistent with sclerosis. There was mild stenosis. - Mitral valve: Calcified annulus. - Pericardium, extracardiac: A moderate, free-flowing pericardial  effusion was identified along the right ventricular  free wall.  The fluid had no internal echoes. There was mildright ventricular chamber collapse for less than 50% of the cardiac cycle.  Antimicrobials:  Rocephin completed   Subjective: Appropriate this AM. Discussed risks and benefits of transfusion, pt VU. C/o being weak. States he needs help on d/c. Wants placement.   Objective: Filed Vitals:   08/11/15 0351 08/11/15 0500 08/11/15 0800 08/11/15 1203  BP: 122/62  115/60 116/64  Pulse: 77  78 79  Temp: 97.7 F (36.5 C)  98.4 F (36.9 C) 97.5 F (36.4 C)  TempSrc: Axillary  Oral Oral  Resp: 16  16 13   Height:      Weight:  96.934 kg (213 lb 11.2 oz)    SpO2: 99%  93% 99%    Intake/Output Summary (Last 24 hours) at 08/11/15 1636 Last data filed at 08/11/15 1300  Gross per 24 hour  Intake    960 ml  Output   1500 ml  Net   -540 ml   Filed Weights   08/09/15 0600 08/10/15 0500 08/11/15 0500  Weight: 97.8 kg (215 lb 9.8 oz)  95.119 kg (209 lb 11.2 oz) 96.934 kg (213 lb 11.2 oz)    Examination:  General exam: NAD, A&Ox3 Respiratory system: Diminished breath sounds at bases, crackles at bases  Cardiovascular system: S1 & S2 heard, RRR. SEM 2/6, No JVD, rubs, gallops or clicks. No pedal edema. Gastrointestinal system: Abdomen is slightly distended, NTTP Central nervous system: Appropriate, moving all 4 extremities spontaneously when awake   Data Reviewed: I have personally reviewed following labs and imaging studies  CBC:  Recent Labs Lab 08/07/15 0835 08/08/15 0444 08/09/15 0324 08/10/15 0430 08/11/15 0454  WBC 17.4* 19.9* 19.9* 17.1* 16.8*  HGB 7.8* 7.9* 7.6* 7.2* 9.7*  HCT 24.3* 25.2* 23.7* 22.6* 29.5*  MCV 88.0 88.7 88.8 88.3 87.0  PLT 357 458* 464* 435* 0000000   Basic Metabolic Panel:  Recent Labs Lab 08/05/15 1656 08/06/15 0532 08/07/15 0835 08/08/15 0444 08/09/15 0324 08/09/15 1136 08/10/15 0430  NA  --  130* 129* 130* 132* 133* 135  K  --  5.0 5.3* 5.1 5.3* 5.3* 4.3  CL  --  102 101 100* 100*  102 103  CO2  --  16* 16* 18* 16* 17* 18*  GLUCOSE  --  140* 149* 128* 123* 159* 169*  BUN  --  63* 65* 64* 73* 77* 71*  CREATININE  --  3.58* 3.44* 3.42* 3.47* 3.59* 3.25*  CALCIUM  --  8.1* 8.0* 8.2* 8.2* 8.4* 8.2*  MG 1.9  --   --   --   --   --   --   PHOS  --  4.9* 4.5  --   --  4.6  --    Liver Function Tests:  Recent Labs Lab 08/06/15 0532 08/07/15 0835 08/09/15 1136  ALBUMIN 2.1* 2.1* 2.3*   Cardiac Enzymes:  Recent Labs Lab 08/05/15 1656 08/08/15 0444 08/09/15 0324  CKTOTAL 449* 66 67   BNP (last 3 results)  Recent Labs  10/30/14 1056  PROBNP 117.0*   CBG:  Recent Labs Lab 08/10/15 1129 08/10/15 1627 08/10/15 2125 08/11/15 0738 08/11/15 1119  GLUCAP 164* 190* 165* 156* 158*    Radiology Studies: Dg Chest 2 View 08/01/2015   No acute chest findings.   Marland Kitchen amiodarone  200 mg Oral BID  . aspirin EC  81 mg Oral Daily  . atorvastatin  80 mg Oral Daily  . buPROPion  300 mg Oral Daily  . doxercalciferol  2.5 mcg Oral Daily  . DULoxetine  60 mg Oral Daily  . fluticasone  2 spray Each Nare Daily  . gabapentin  600 mg Oral TID  . insulin aspart  0-15 Units Subcutaneous TID WC  . ipratropium-albuterol  3 mL Nebulization Q6H  . levothyroxine  112 mcg Oral QAC breakfast  . montelukast  10 mg Oral QHS  . nebivolol  2.5 mg Oral Daily  . pantoprazole  40 mg Oral Daily  . ranolazine  500 mg Oral BID  . roflumilast  500 mcg Oral Daily  . senna  1 tablet Oral BID  . sodium bicarbonate  650 mg Oral BID  . sodium polystyrene  30 g Oral Once  . testosterone  5 g Transdermal Daily     LOS: 10 days   Time spent: 20 minutes   Elwin Mocha, MD [facility] Pager [phone]  If 7PM-7AM, please contact night-coverage [email or web address] Password [facility] 08/11/2015, 4:36 PM

## 2015-08-11 NOTE — Progress Notes (Signed)
Hemocult resulted positive. Walden Field NP notified. Order received to increase protonix to BID.

## 2015-08-11 NOTE — Progress Notes (Signed)
Patient Name: Ricky Baker Date of Encounter: 08/11/2015   Primary Cardiologist: Dr. Meda Coffee.  80 yo male with prior medical history of PAF-on Eliquis 5 mg orally twice a day, coronary artery disease status post placement of 2 stents in 2013, COPD on home O2 at 2 liters, congestive heart failure, hypertension, IDDM, hyperlipidemia, diabetic neuropathy in legs presented with weakness, SOB, and pleuritic chest pain. Per patient he has been diagnosed at least twice with PNA this year. He was admitted on 3/10-3/13 with sepsis and PNA in [place]. He was treated with abx. He has had multiple outpatient visit this year. It appears he was sick again in Apr with fever and symptom concerning for PNA. CXR was obtained and he was treated again with Augmentin on 4/20. By the time he was seen on 4/26 as outpatient, he endorsed severe fatigue, fever and SOB with minimal exertion. He did not take lasix for 5 days because he feels so bad. He was admitted to [facility]. Lasix was initially held and later restarted. Hospital course complicated by acute on chronic renal insufficiency, nephrology consulted. He also finished a course of Rocephin IV. Had worsening AMS.     Principal Problem:   Pleuritic chest pain Active Problems:   Acquired autoimmune hypothyroidism   History of coronary artery disease   Major depressive disorder, recurrent episode, moderate (HCC)   Essential hypertension   PAF (paroxysmal atrial fibrillation) (HCC)   Type II diabetes mellitus, uncontrolled (HCC)   Chronic respiratory failure (HCC)   CKD (chronic kidney disease)   Leukocytosis   Hyperglycemia   Chronic renal insufficiency   Moderate aortic stenosis by prior echocardiogram   CAD S/P percutaneous coronary angioplasty   Chest pain   Acute on chronic diastolic CHF (congestive heart failure), NYHA class 1 (HCC)    SUBJECTIVE  Feels weak; mild dyspnea; no chest pain  CURRENT MEDS . amiodarone  200 mg Oral Daily  . antiseptic oral  rinse  7 mL Mouth Rinse q12n4p  . atorvastatin  80 mg Oral Daily  . buPROPion  300 mg Oral Daily  . chlorhexidine  15 mL Mouth Rinse BID  . doxercalciferol  2.5 mcg Oral Daily  . DULoxetine  60 mg Oral Daily  . feeding supplement (ENSURE ENLIVE)  237 mL Oral TID BM  . fluticasone  2 spray Each Nare Daily  . furosemide  80 mg Oral Daily  . gabapentin  600 mg Oral TID  . insulin aspart  0-15 Units Subcutaneous TID WC  . levothyroxine  112 mcg Oral QAC breakfast  . montelukast  10 mg Oral QHS  . nebivolol  2.5 mg Oral Daily  . pantoprazole  40 mg Oral BID  . ranolazine  500 mg Oral BID  . roflumilast  500 mcg Oral Daily  . senna  1 tablet Oral BID  . sodium bicarbonate  650 mg Oral BID  . sodium chloride  500 mL Intravenous Once  . testosterone  5 g Transdermal Daily  . [START ON 08/24/2015] Vitamin D (Ergocalciferol)  50,000 Units Oral Q30 days    OBJECTIVE  Filed Vitals:   08/11/15 0200 08/11/15 0351 08/11/15 0500 08/11/15 0800  BP: 109/58 122/62  115/60  Pulse: 77 77  78  Temp:  97.7 F (36.5 C)  98.4 F (36.9 C)  TempSrc:  Axillary  Oral  Resp: 16 16  16   Height:      Weight:   213 lb 11.2 oz (96.934 kg)   SpO2:  97% 99%  93%    Intake/Output Summary (Last 24 hours) at 08/11/15 1027 Last data filed at 08/11/15 0900  Gross per 24 hour  Intake   1197 ml  Output   1400 ml  Net   -203 ml   Filed Weights   08/09/15 0600 08/10/15 0500 08/11/15 0500  Weight: 215 lb 9.8 oz (97.8 kg) 209 lb 11.2 oz (95.119 kg) 213 lb 11.2 oz (96.934 kg)    PHYSICAL EXAM  General: more alert Neuro: Grossly intact HEENT:  Normal  Neck: Supple  Lungs:  Mildly diminished BS bases Heart: RRR  Abdomen: Soft, non-tender, non-distended Extremities: No edema.   Accessory Clinical Findings  CBC  Recent Labs  08/10/15 0430 08/11/15 0454  WBC 17.1* 16.8*  HGB 7.2* 9.7*  HCT 22.6* 29.5*  MCV 88.3 87.0  PLT 435* 0000000   Basic Metabolic Panel  Recent Labs  08/09/15 1136  08/10/15 0430  NA 133* 135  K 5.3* 4.3  CL 102 103  CO2 17* 18*  GLUCOSE 159* 169*  BUN 77* 71*  CREATININE 3.59* 3.25*  CALCIUM 8.4* 8.2*  PHOS 4.6  --    Liver Function Tests  Recent Labs  08/09/15 1136  ALBUMIN 2.3*   Cardiac Enzymes  Recent Labs  08/09/15 0324  CKTOTAL 67     Echocardiogram 04/04/2015  LV EF: 55% - 60%  ------------------------------------------------------------------- Indications: 424.1 Aortic valve disorders.  ------------------------------------------------------------------- History: PMH: Chest pain. Syncope and murmur. Atrial fibrillation. Coronary artery disease. Aortic stenosis. Risk factors: Former tobacco use. Hypertension. Diabetes mellitus.  ------------------------------------------------------------------- Study Conclusions  - Left ventricle: The cavity size was normal. There was  moderate-severe concentric hypertrophy. Systolic function was  normal. The estimated ejection fraction was in the range of 55%  to 60%. Wall motion was normal; there were no regional wall  motion abnormalities. Doppler parameters are consistent with  abnormal left ventricular relaxation (grade 1 diastolic  dysfunction). - Aortic valve: There was mild stenosis. Valve area (VTI): 1.28  cm^2. Valve area (Vmax): 1.25 cm^2. Valve area (Vmean): 1.15  cm^2. - Mitral valve: Valve area by continuity equation (using LVOT  flow): 2.56 cm^2. - Right ventricle: The cavity size was normal. Wall thickness was  normal. Systolic function was normal. - Atrial septum: There was increased thickness of the septum,  consistent with lipomatous hypertrophy. - Inferior vena cava: The vessel was normal in size. The  respirophasic diameter changes were in the normal range (>= 50%),  consistent with normal central venous pressure.    Radiology/Studies  Dg Chest 2 View  08/09/2015  CLINICAL DATA:  Shortness of breath. EXAM: CHEST  2  VIEW COMPARISON:  08/08/2015. FINDINGS: Mediastinum and hilar structures normal. Cardiomegaly again noted. Interim partial clearing of bilateral pulmonary infiltrates/edema. Small bilateral pleural effusions again noted. No pneumothorax . IMPRESSION: Congestive heart failure with interim partial clearing of pulmonary edema. Small bilateral pleural effusions. Electronically Signed   By: Marcello Moores  Register   On: 08/09/2015 08:18   Dg Chest 2 View  08/01/2015  CLINICAL DATA:  Chest pains and cough. Sore throat. History of esophageal cancer. EXAM: CHEST  2 VIEW COMPARISON:  07/26/2015 FINDINGS: Lungs are clear bilaterally. Retrocardiac density is compatible with history of esophageal cancer and previous gastric pull-through procedure. There is a nodular density in the right lower chest which was present on the exam from 09/21/2014 and may be related to a nipple shadow. Atherosclerotic calcifications in the thoracic aorta. IMPRESSION: No acute chest findings. Electronically Signed  By: Markus Daft M.D.   On: 08/01/2015 16:04   Dg Chest 2 View  07/26/2015  CLINICAL DATA:  Cough, fever, and chest congestion for the past week ; history of COPD, former smoker, history of pneumonia several months ago. EXAM: CHEST  2 VIEW COMPARISON:  PA and lateral chest x-ray of October 25, 2014 FINDINGS: The lungs remain mildly hyperinflated with hemidiaphragm flattening. The lung markings are coarse in the right lower lobe posteriorly and are more conspicuous than on the previous study. The left lung is clear. The heart and pulmonary vascularity are normal. The mediastinum is normal in width. There is no pleural effusion. There is mild multilevel degenerative disc disease of the thoracic spine. IMPRESSION: COPD. Posterior left lower lobe infiltrate. There is no pleural effusion or evidence of CHF. Followup PA and lateral chest X-ray is recommended in 3-4 weeks following trial of antibiotic therapy to ensure resolution and exclude  underlying malignancy. Electronically Signed   By: David  Martinique M.D.   On: 07/26/2015 12:23   Ct Chest Wo Contrast  08/04/2015  CLINICAL DATA:  Dyspnea. EXAM: CT CHEST WITHOUT CONTRAST TECHNIQUE: Multidetector CT imaging of the chest was performed following the standard protocol without IV contrast. COMPARISON:  Chest radiograph of August 01, 2015. CT scan of November 06, 2004. FINDINGS: No pneumothorax is noted. Minimal bilateral pleural effusions are noted with adjacent subsegmental atelectasis. Atherosclerosis of thoracic aorta is noted without aneurysm. Mild pericardial effusion is noted. Aberrant right subclavian artery is noted. Visualized portion of upper abdomen is unremarkable. Aortic valve calcifications are noted. Coronary artery calcifications are noted as well. No mediastinal adenopathy is noted. The esophagus is dilated and full of debris concerning for distal obstruction. The patient is status post esophageal surgery. IMPRESSION: Minimal bilateral posterior basilar pleural effusions are noted with adjacent subsegmental atelectasis. Atherosclerosis thoracic aorta is noted without aneurysm formation. Mild pericardial effusion is noted. Aortic valve calcifications are noted. Coronary artery calcifications are noted suggesting coronary artery disease. Dilated esophagus is noted full of debris concerning for distal obstruction. The patient is status post esophageal surgery. Endoscopy is recommended for further evaluation. Electronically Signed   By: Marijo Conception, M.D.   On: 08/04/2015 15:08   US Renal  08/03/2015  CLINICAL DATA:  80 year old male with renal failure. EXAM: RENAL / URINARY TRACT ULTRASOUND COMPLETE COMPARISON:  Noncontrast CT 03/08/2015 FINDINGS: Right Kidney: Length: 11.4 cm. Echogenicity within normal limits. Mild thinning of renal parenchyma. No mass or hydronephrosis visualized. Lower pole calculi on prior CT are not visualized sonographically. Left Kidney: Length: 9.8 cm.  Echogenicity within normal limits. There is thinning of renal parenchyma. No mass or hydronephrosis visualized. The cyst in the upper left kidney on prior CT is not visualized sonographically. Bladder: Decompressed by Foley catheter and not well evaluated. IMPRESSION: No obstructive uropathy. There is thinning of both renal parenchyma consistent with chronic medical renal disease. Electronically Signed   By: Jeb Levering M.D.   On: 08/03/2015 19:15   Nm Pulmonary Perf And Vent  08/10/2015  CLINICAL DATA:  Shortness of breath and chest pain EXAM: NUCLEAR MEDICINE VENTILATION - PERFUSION LUNG SCAN Views: Anterior, posterior, LPO, RPO, LAO, RAO -ventilation and perfusion RADIOPHARMACEUTICALS:  [id number] millicurie mCi [id number] DTPA aerosol inhalation and 4.3 millicurie mCi [id number] MAA IV COMPARISON:  Chest radiograph Aug 09, 2015 FINDINGS: Ventilation: There are scattered small nonsegmental ventilation defects bilaterally. Perfusion: There are a few nonsegmental perfusion defects which match ventilation defects. There is no appreciable ventilation/perfusion mismatch.  There is no segmental level perfusion defect. Cardiomegaly is noted. IMPRESSION: No appreciable ventilation/ perfusion mismatch. No segmental sized perfusion defects. This study constitutes a low probability of pulmonary embolus. There is cardiomegaly. Electronically Signed   By: Lowella Grip III M.D.   On: 08/10/2015 15:16   Dg Chest Port 1 View  08/08/2015  CLINICAL DATA:  Shortness of breath EXAM: PORTABLE CHEST 1 VIEW COMPARISON:  08/01/2015 FINDINGS: Cardiac enlargement is identified. Increase in bilateral pleural effusions with progressive pulmonary edema. Aortic atherosclerosis noted. No airspace consolidation identified. IMPRESSION: 1. Cardiac enlargement and CHF. Electronically Signed   By: Kerby Moors M.D.   On: 08/08/2015 07:24   Dg Abd 2 Views  08/02/2015  CLINICAL DATA:  Vomiting x 1 hour. Pt denies any abdominal  pain. Recent dx last week of PNA. Hx multiple abdominal conditions and surgeries. EXAM: ABDOMEN - 2 VIEW COMPARISON:  CT, 03/08/2015 FINDINGS: Normal bowel gas pattern.  No free air. There is a vague calcification adjacent to the L3 spinous process measuring approximately 16 mm greatest dimension. This represents a small nonspecific calcified mesenteric lesion on the prior CT, likely an area remote fat necrosis. No evidence of renal or ureteral stones. IMPRESSION: 1. No acute findings.  No evidence of obstruction or free air. Electronically Signed   By: Lajean Manes M.D.   On: 08/02/2015 19:40    ASSESSMENT AND PLAN  1. Chronic diastolic Heart Failure  - Echo 04/04/2015 EF 55-60%, no RWMA, mild AS, moderate pericardial effusion.  - Patient's volume status appears reasonable. Lasix was resumed at 80 mg daily by nephrology.  2. Acute on chronic renal insufficiency  - nephrology following; lasix resumed; follow renal function.  3. Pleuritic chest pain: trop flat, no further workup  4. PAF-was on Eliquis 5 mg orally twice a day; on hold due to anemia. Resume when Hgb stable. Add DVT prophylaxis heparin. Continue amiodarone and beta blocker. Remains in sinus.  5. CAD s/p2 stents in 2013; continue statin.  6. COPD on home O2 at 2 liters  7. Acute on chronic anemia:eliquis on hold, now s/p transfusion.  Signed,  Ellis Parents

## 2015-08-11 NOTE — Progress Notes (Signed)
Assessment/ Plan: Pt is a 80 y.o. yo male who was admitted on 08/01/2015 with acute on chronic kidney disease & CHF Assessment/Plan: 1. AoCKD- Cr 2.88 at admission.  Plan on PO furosemide  Subjective: Interval History: Weak  Objective: Vital signs in last 24 hours: Temp:  [97.5 F (36.4 C)-98.4 F (36.9 C)] 97.5 F (36.4 C) (05/06 1203) Pulse Rate:  [75-81] 79 (05/06 1203) Resp:  [13-32] 13 (05/06 1203) BP: (97-125)/(49-83) 116/64 mmHg (05/06 1203) SpO2:  [93 %-100 %] 99 % (05/06 1203) Weight:  [96.934 kg (213 lb 11.2 oz)] 96.934 kg (213 lb 11.2 oz) (05/06 0500) Weight change: 1.814 kg (4 lb)  Intake/Output from previous day: 05/05 0701 - 05/06 0700 In: 1077 [P.O.:407; Blood:670] Out: 1400 [Urine:1400] Intake/Output this shift: Total I/O In: 120 [P.O.:120] Out: 600 [Urine:600]  General appearance: alert, cooperative and pale Lungs poor insp Cor RRR Extremities: edema 1+  Lab Results:  Recent Labs  08/10/15 0430 08/11/15 0454  WBC 17.1* 16.8*  HGB 7.2* 9.7*  HCT 22.6* 29.5*  PLT 435* 369   BMET:  Recent Labs  08/09/15 1136 08/10/15 0430  NA 133* 135  K 5.3* 4.3  CL 102 103  CO2 17* 18*  GLUCOSE 159* 169*  BUN 77* 71*  CREATININE 3.59* 3.25*  CALCIUM 8.4* 8.2*   No results for input(s): PTH in the last 72 hours. Iron Studies: No results for input(s): IRON, TIBC, TRANSFERRIN, FERRITIN in the last 72 hours. Studies/Results: Nm Pulmonary Perf And Vent  08/10/2015  CLINICAL DATA:  Shortness of breath and chest pain EXAM: NUCLEAR MEDICINE VENTILATION - PERFUSION LUNG SCAN Views: Anterior, posterior, LPO, RPO, LAO, RAO -ventilation and perfusion RADIOPHARMACEUTICALS:  123XX123 millicurie mCi [id number] DTPA aerosol inhalation and 4.3 millicurie mCi [id number] MAA IV COMPARISON:  Chest radiograph Aug 09, 2015 FINDINGS: Ventilation: There are scattered small nonsegmental ventilation defects bilaterally. Perfusion: There are a few nonsegmental perfusion defects  which match ventilation defects. There is no appreciable ventilation/perfusion mismatch. There is no segmental level perfusion defect. Cardiomegaly is noted. IMPRESSION: No appreciable ventilation/ perfusion mismatch. No segmental sized perfusion defects. This study constitutes a low probability of pulmonary embolus. There is cardiomegaly. Electronically Signed   By: Lowella Grip III M.D.   On: 08/10/2015 15:16    Scheduled: . amiodarone  200 mg Oral Daily  . antiseptic oral rinse  7 mL Mouth Rinse q12n4p  . atorvastatin  80 mg Oral Daily  . buPROPion  300 mg Oral Daily  . chlorhexidine  15 mL Mouth Rinse BID  . doxercalciferol  2.5 mcg Oral Daily  . DULoxetine  60 mg Oral Daily  . feeding supplement (ENSURE ENLIVE)  237 mL Oral TID BM  . fluticasone  2 spray Each Nare Daily  . furosemide  80 mg Oral Daily  . gabapentin  600 mg Oral TID  . heparin subcutaneous  5,000 Units Subcutaneous Q8H  . insulin aspart  0-15 Units Subcutaneous TID WC  . levothyroxine  112 mcg Oral QAC breakfast  . montelukast  10 mg Oral QHS  . nebivolol  2.5 mg Oral Daily  . pantoprazole  40 mg Oral BID  . ranolazine  500 mg Oral BID  . roflumilast  500 mcg Oral Daily  . senna  1 tablet Oral BID  . sodium bicarbonate  650 mg Oral BID  . sodium chloride  500 mL Intravenous Once  . testosterone  5 g Transdermal Daily  . [START ON 08/24/2015] Vitamin D (Ergocalciferol)  50,000  Units Oral Q30 days    LOS: 10 days   Laiken Nohr C 08/11/2015,2:26 PM

## 2015-08-12 DIAGNOSIS — Z7189 Other specified counseling: Secondary | ICD-10-CM | POA: Insufficient documentation

## 2015-08-12 DIAGNOSIS — Z515 Encounter for palliative care: Secondary | ICD-10-CM | POA: Insufficient documentation

## 2015-08-12 LAB — TYPE AND SCREEN
ABO/RH(D): B POS
Antibody Screen: NEGATIVE
UNIT DIVISION: 0
UNIT DIVISION: 0

## 2015-08-12 LAB — CBC WITH DIFFERENTIAL/PLATELET
Basophils Absolute: 0 10*3/uL (ref 0.0–0.1)
Basophils Relative: 0 %
EOS ABS: 0.1 10*3/uL (ref 0.0–0.7)
EOS PCT: 1 %
HCT: 30.2 % — ABNORMAL LOW (ref 39.0–52.0)
Hemoglobin: 9.7 g/dL — ABNORMAL LOW (ref 13.0–17.0)
LYMPHS ABS: 0.9 10*3/uL (ref 0.7–4.0)
LYMPHS PCT: 7 %
MCH: 27.9 pg (ref 26.0–34.0)
MCHC: 32.1 g/dL (ref 30.0–36.0)
MCV: 86.8 fL (ref 78.0–100.0)
MONOS PCT: 6 %
Monocytes Absolute: 0.9 10*3/uL (ref 0.1–1.0)
Neutro Abs: 12.1 10*3/uL — ABNORMAL HIGH (ref 1.7–7.7)
Neutrophils Relative %: 86 %
PLATELETS: 328 10*3/uL (ref 150–400)
RBC: 3.48 MIL/uL — ABNORMAL LOW (ref 4.22–5.81)
RDW: 15.5 % (ref 11.5–15.5)
WBC: 14 10*3/uL — ABNORMAL HIGH (ref 4.0–10.5)

## 2015-08-12 LAB — GLUCOSE, CAPILLARY
GLUCOSE-CAPILLARY: 130 mg/dL — AB (ref 65–99)
GLUCOSE-CAPILLARY: 140 mg/dL — AB (ref 65–99)
GLUCOSE-CAPILLARY: 175 mg/dL — AB (ref 65–99)
GLUCOSE-CAPILLARY: 124 mg/dL — AB (ref 65–99)

## 2015-08-12 LAB — RENAL FUNCTION PANEL
Albumin: 1.9 g/dL — ABNORMAL LOW (ref 3.5–5.0)
Anion gap: 14 (ref 5–15)
BUN: 69 mg/dL — AB (ref 6–20)
CHLORIDE: 102 mmol/L (ref 101–111)
CO2: 22 mmol/L (ref 22–32)
CREATININE: 3.03 mg/dL — AB (ref 0.61–1.24)
Calcium: 8.4 mg/dL — ABNORMAL LOW (ref 8.9–10.3)
GFR calc Af Amer: 21 mL/min — ABNORMAL LOW (ref >60)
GFR calc non Af Amer: 18 mL/min — ABNORMAL LOW (ref >60)
GLUCOSE: 137 mg/dL — AB (ref 65–99)
POTASSIUM: 4.1 mmol/L (ref 3.5–5.1)
Phosphorus: 3.9 mg/dL (ref 2.5–4.6)
Sodium: 138 mmol/L (ref 135–145)

## 2015-08-12 NOTE — Progress Notes (Signed)
Assessment/ Plan: Pt is a 80 y.o. yo male who was admitted on 08/01/2015 with acute on chronic kidney disease & CHF Assessment/Plan: 1. AoCKD- Cr 2.88 at admission. 3.03<<3.25, improving UOP to 1125 with PO lasix   Plan on PO furosemide  Renal Attending: He is tolerating the 80mg  daily furosemide.  He has significant chronic dyspnea on home ox. Will follow a few more days with you. Kerim Statzer C   Subjective: No acute concerns  Objective: Vital signs in last 24 hours: Temp:  [97.4 F (36.3 C)-98.6 F (37 C)] 97.4 F (36.3 C) (05/07 0520) Pulse Rate:  [75-79] 76 (05/07 0520) Resp:  [12-16] 12 (05/07 0520) BP: (112-120)/(56-64) 118/58 mmHg (05/07 0520) SpO2:  [97 %-99 %] 97 % (05/07 0520) Weight:  [94.303 kg (207 lb 14.4 oz)] 94.303 kg (207 lb 14.4 oz) (05/07 0520) Weight change: -2.631 kg (-5 lb 12.8 oz)  Intake/Output from previous day: 05/06 0701 - 05/07 0700 In: 420 [P.O.:420] Out: 1125 [Urine:1125] Intake/Output this shift:    General appearance: alert, cooperative and pale Lungs poor insp Cor RRR Extremities: edema 1+  Lab Results:  Recent Labs  08/11/15 0454 08/12/15 0502  WBC 16.8* 14.0*  HGB 9.7* 9.7*  HCT 29.5* 30.2*  PLT 369 328   BMET:   Recent Labs  08/10/15 0430 08/12/15 0502  NA 135 138  K 4.3 4.1  CL 103 102  CO2 18* 22  GLUCOSE 169* 137*  BUN 71* 69*  CREATININE 3.25* 3.03*  CALCIUM 8.2* 8.4*   No results for input(s): PTH in the last 72 hours. Iron Studies: No results for input(s): IRON, TIBC, TRANSFERRIN, FERRITIN in the last 72 hours. Studies/Results: Nm Pulmonary Perf And Vent  08/10/2015  CLINICAL DATA:  Shortness of breath and chest pain EXAM: NUCLEAR MEDICINE VENTILATION - PERFUSION LUNG SCAN Views: Anterior, posterior, LPO, RPO, LAO, RAO -ventilation and perfusion RADIOPHARMACEUTICALS:  123XX123 millicurie mCi [id number] DTPA aerosol inhalation and 4.3 millicurie mCi [id number] MAA IV COMPARISON:  Chest radiograph Aug 09, 2015 FINDINGS: Ventilation: There are scattered small nonsegmental ventilation defects bilaterally. Perfusion: There are a few nonsegmental perfusion defects which match ventilation defects. There is no appreciable ventilation/perfusion mismatch. There is no segmental level perfusion defect. Cardiomegaly is noted. IMPRESSION: No appreciable ventilation/ perfusion mismatch. No segmental sized perfusion defects. This study constitutes a low probability of pulmonary embolus. There is cardiomegaly. Electronically Signed   By: Lowella Grip III M.D.   On: 08/10/2015 15:16    Scheduled: . amiodarone  200 mg Oral Daily  . antiseptic oral rinse  7 mL Mouth Rinse q12n4p  . atorvastatin  80 mg Oral Daily  . buPROPion  300 mg Oral Daily  . chlorhexidine  15 mL Mouth Rinse BID  . doxercalciferol  2.5 mcg Oral Daily  . DULoxetine  60 mg Oral Daily  . feeding supplement (ENSURE ENLIVE)  237 mL Oral TID BM  . fluticasone  2 spray Each Nare Daily  . furosemide  80 mg Oral Daily  . gabapentin  600 mg Oral TID  . heparin subcutaneous  5,000 Units Subcutaneous Q8H  . insulin aspart  0-15 Units Subcutaneous TID WC  . levothyroxine  112 mcg Oral QAC breakfast  . montelukast  10 mg Oral QHS  . nebivolol  2.5 mg Oral Daily  . pantoprazole  40 mg Oral BID  . ranolazine  500 mg Oral BID  . roflumilast  500 mcg Oral Daily  . senna  1 tablet Oral BID  .  sodium bicarbonate  650 mg Oral BID  . sodium chloride  500 mL Intravenous Once  . testosterone  5 g Transdermal Daily  . [START ON 08/24/2015] Vitamin D (Ergocalciferol)  50,000 Units Oral Q30 days    LOS: 11 days   Haney,Alyssa 08/12/2015,8:19 AM

## 2015-08-12 NOTE — Progress Notes (Signed)
Patient Name: Ricky Baker Date of Encounter: 08/12/2015   Primary Cardiologist: Dr. Meda Coffee.  80 yo male with prior medical history of PAF-on Eliquis 5 mg orally twice a day, coronary artery disease status post placement of 2 stents in 2013, COPD on home O2 at 2 liters, congestive heart failure, hypertension, IDDM, hyperlipidemia, diabetic neuropathy in legs presented with weakness, SOB, and pleuritic chest pain. Per patient he has been diagnosed at least twice with PNA this year. He was admitted on 3/10-3/13 with sepsis and PNA in [place]. He was treated with abx. He has had multiple outpatient visit this year. It appears he was sick again in Apr with fever and symptom concerning for PNA. CXR was obtained and he was treated again with Augmentin on 4/20. By the time he was seen on 4/26 as outpatient, he endorsed severe fatigue, fever and SOB with minimal exertion. He did not take lasix for 5 days because he feels so bad. He was admitted to [facility]. Lasix was initially held and later restarted. Hospital course complicated by acute on chronic renal insufficiency, nephrology consulted. He also finished a course of Rocephin IV. Had worsening AMS.     Principal Problem:   Pleuritic chest pain Active Problems:   Acquired autoimmune hypothyroidism   History of coronary artery disease   Major depressive disorder, recurrent episode, moderate (HCC)   Essential hypertension   PAF (paroxysmal atrial fibrillation) (HCC)   Type II diabetes mellitus, uncontrolled (HCC)   Chronic respiratory failure (HCC)   CKD (chronic kidney disease)   Leukocytosis   Hyperglycemia   Chronic renal insufficiency   Moderate aortic stenosis by prior echocardiogram   CAD S/P percutaneous coronary angioplasty   Chest pain   Acute on chronic diastolic CHF (congestive heart failure), NYHA class 1 (HCC)   Dyspnea    SUBJECTIVE  Feels weak; mild dyspnea; no chest pain  CURRENT MEDS . amiodarone  200 mg Oral Daily  .  antiseptic oral rinse  7 mL Mouth Rinse q12n4p  . atorvastatin  80 mg Oral Daily  . buPROPion  300 mg Oral Daily  . chlorhexidine  15 mL Mouth Rinse BID  . doxercalciferol  2.5 mcg Oral Daily  . DULoxetine  60 mg Oral Daily  . feeding supplement (ENSURE ENLIVE)  237 mL Oral TID BM  . fluticasone  2 spray Each Nare Daily  . furosemide  80 mg Oral Daily  . gabapentin  600 mg Oral TID  . heparin subcutaneous  5,000 Units Subcutaneous Q8H  . insulin aspart  0-15 Units Subcutaneous TID WC  . levothyroxine  112 mcg Oral QAC breakfast  . montelukast  10 mg Oral QHS  . nebivolol  2.5 mg Oral Daily  . pantoprazole  40 mg Oral BID  . ranolazine  500 mg Oral BID  . roflumilast  500 mcg Oral Daily  . senna  1 tablet Oral BID  . sodium bicarbonate  650 mg Oral BID  . sodium chloride  500 mL Intravenous Once  . testosterone  5 g Transdermal Daily  . [START ON 08/24/2015] Vitamin D (Ergocalciferol)  50,000 Units Oral Q30 days    OBJECTIVE  Filed Vitals:   08/12/15 0400 08/12/15 0520 08/12/15 0800 08/12/15 0846  BP: 118/58 118/58 120/67   Pulse: 75 76 77   Temp:  97.4 F (36.3 C)  97.5 F (36.4 C)  TempSrc:    Oral  Resp: 16 12 17    Height:  Weight:  207 lb 14.4 oz (94.303 kg)    SpO2: 99% 97% 97%     Intake/Output Summary (Last 24 hours) at 08/12/15 0921 Last data filed at 08/12/15 0522  Gross per 24 hour  Intake    300 ml  Output   1125 ml  Net   -825 ml   Filed Weights   08/10/15 0500 08/11/15 0500 08/12/15 0520  Weight: 209 lb 11.2 oz (95.119 kg) 213 lb 11.2 oz (96.934 kg) 207 lb 14.4 oz (94.303 kg)    PHYSICAL EXAM  General: alert, NAD Neuro: Grossly intact HEENT:  Normal  Neck: Supple  Lungs:  CTA Heart: RRR  Abdomen: Soft, non-tender, non-distended Extremities: No edema.   Accessory Clinical Findings  CBC  Recent Labs  08/11/15 0454 08/12/15 0502  WBC 16.8* 14.0*  NEUTROABS  --  12.1*  HGB 9.7* 9.7*  HCT 29.5* 30.2*  MCV 87.0 86.8  PLT 369 [id number]    Basic Metabolic Panel  Recent Labs  08/09/15 1136 08/10/15 0430 08/12/15 0502  NA 133* 135 138  K 5.3* 4.3 4.1  CL 102 103 102  CO2 17* 18* 22  GLUCOSE 159* 169* 137*  BUN 77* 71* 69*  CREATININE 3.59* 3.25* 3.03*  CALCIUM 8.4* 8.2* 8.4*  PHOS 4.6  --  3.9   Liver Function Tests  Recent Labs  08/09/15 1136 08/12/15 0502  ALBUMIN 2.3* 1.9*     Echocardiogram 04/04/2015  LV EF: 55% - 60%  ------------------------------------------------------------------- Indications: 424.1 Aortic valve disorders.  ------------------------------------------------------------------- History: PMH: Chest pain. Syncope and murmur. Atrial fibrillation. Coronary artery disease. Aortic stenosis. Risk factors: Former tobacco use. Hypertension. Diabetes mellitus.  ------------------------------------------------------------------- Study Conclusions  - Left ventricle: The cavity size was normal. There was  moderate-severe concentric hypertrophy. Systolic function was  normal. The estimated ejection fraction was in the range of 55%  to 60%. Wall motion was normal; there were no regional wall  motion abnormalities. Doppler parameters are consistent with  abnormal left ventricular relaxation (grade 1 diastolic  dysfunction). - Aortic valve: There was mild stenosis. Valve area (VTI): 1.28  cm^2. Valve area (Vmax): 1.25 cm^2. Valve area (Vmean): 1.15  cm^2. - Mitral valve: Valve area by continuity equation (using LVOT  flow): 2.56 cm^2. - Right ventricle: The cavity size was normal. Wall thickness was  normal. Systolic function was normal. - Atrial septum: There was increased thickness of the septum,  consistent with lipomatous hypertrophy. - Inferior vena cava: The vessel was normal in size. The  respirophasic diameter changes were in the normal range (>= 50%),  consistent with normal central venous pressure.    Radiology/Studies  Dg Chest 2  View  08/09/2015  CLINICAL DATA:  Shortness of breath. EXAM: CHEST  2 VIEW COMPARISON:  08/08/2015. FINDINGS: Mediastinum and hilar structures normal. Cardiomegaly again noted. Interim partial clearing of bilateral pulmonary infiltrates/edema. Small bilateral pleural effusions again noted. No pneumothorax . IMPRESSION: Congestive heart failure with interim partial clearing of pulmonary edema. Small bilateral pleural effusions. Electronically Signed   By: Marcello Moores  Register   On: 08/09/2015 08:18   Dg Chest 2 View  08/01/2015  CLINICAL DATA:  Chest pains and cough. Sore throat. History of esophageal cancer. EXAM: CHEST  2 VIEW COMPARISON:  07/26/2015 FINDINGS: Lungs are clear bilaterally. Retrocardiac density is compatible with history of esophageal cancer and previous gastric pull-through procedure. There is a nodular density in the right lower chest which was present on the exam from 09/21/2014 and may be related to  a nipple shadow. Atherosclerotic calcifications in the thoracic aorta. IMPRESSION: No acute chest findings. Electronically Signed   By: Markus Daft M.D.   On: 08/01/2015 16:04   Dg Chest 2 View  07/26/2015  CLINICAL DATA:  Cough, fever, and chest congestion for the past week ; history of COPD, former smoker, history of pneumonia several months ago. EXAM: CHEST  2 VIEW COMPARISON:  PA and lateral chest x-ray of October 25, 2014 FINDINGS: The lungs remain mildly hyperinflated with hemidiaphragm flattening. The lung markings are coarse in the right lower lobe posteriorly and are more conspicuous than on the previous study. The left lung is clear. The heart and pulmonary vascularity are normal. The mediastinum is normal in width. There is no pleural effusion. There is mild multilevel degenerative disc disease of the thoracic spine. IMPRESSION: COPD. Posterior left lower lobe infiltrate. There is no pleural effusion or evidence of CHF. Followup PA and lateral chest X-ray is recommended in 3-4 weeks following  trial of antibiotic therapy to ensure resolution and exclude underlying malignancy. Electronically Signed   By: David  Martinique M.D.   On: 07/26/2015 12:23   Ct Chest Wo Contrast  08/04/2015  CLINICAL DATA:  Dyspnea. EXAM: CT CHEST WITHOUT CONTRAST TECHNIQUE: Multidetector CT imaging of the chest was performed following the standard protocol without IV contrast. COMPARISON:  Chest radiograph of August 01, 2015. CT scan of November 06, 2004. FINDINGS: No pneumothorax is noted. Minimal bilateral pleural effusions are noted with adjacent subsegmental atelectasis. Atherosclerosis of thoracic aorta is noted without aneurysm. Mild pericardial effusion is noted. Aberrant right subclavian artery is noted. Visualized portion of upper abdomen is unremarkable. Aortic valve calcifications are noted. Coronary artery calcifications are noted as well. No mediastinal adenopathy is noted. The esophagus is dilated and full of debris concerning for distal obstruction. The patient is status post esophageal surgery. IMPRESSION: Minimal bilateral posterior basilar pleural effusions are noted with adjacent subsegmental atelectasis. Atherosclerosis thoracic aorta is noted without aneurysm formation. Mild pericardial effusion is noted. Aortic valve calcifications are noted. Coronary artery calcifications are noted suggesting coronary artery disease. Dilated esophagus is noted full of debris concerning for distal obstruction. The patient is status post esophageal surgery. Endoscopy is recommended for further evaluation. Electronically Signed   By: Marijo Conception, M.D.   On: 08/04/2015 15:08   US Renal  08/03/2015  CLINICAL DATA:  80 year old male with renal failure. EXAM: RENAL / URINARY TRACT ULTRASOUND COMPLETE COMPARISON:  Noncontrast CT 03/08/2015 FINDINGS: Right Kidney: Length: 11.4 cm. Echogenicity within normal limits. Mild thinning of renal parenchyma. No mass or hydronephrosis visualized. Lower pole calculi on prior CT are not  visualized sonographically. Left Kidney: Length: 9.8 cm. Echogenicity within normal limits. There is thinning of renal parenchyma. No mass or hydronephrosis visualized. The cyst in the upper left kidney on prior CT is not visualized sonographically. Bladder: Decompressed by Foley catheter and not well evaluated. IMPRESSION: No obstructive uropathy. There is thinning of both renal parenchyma consistent with chronic medical renal disease. Electronically Signed   By: Jeb Levering M.D.   On: 08/03/2015 19:15   Nm Pulmonary Perf And Vent  08/10/2015  CLINICAL DATA:  Shortness of breath and chest pain EXAM: NUCLEAR MEDICINE VENTILATION - PERFUSION LUNG SCAN Views: Anterior, posterior, LPO, RPO, LAO, RAO -ventilation and perfusion RADIOPHARMACEUTICALS:  123XX123 millicurie mCi [id number] DTPA aerosol inhalation and 4.3 millicurie mCi [id number] MAA IV COMPARISON:  Chest radiograph Aug 09, 2015 FINDINGS: Ventilation: There are scattered small nonsegmental ventilation defects bilaterally.  Perfusion: There are a few nonsegmental perfusion defects which match ventilation defects. There is no appreciable ventilation/perfusion mismatch. There is no segmental level perfusion defect. Cardiomegaly is noted. IMPRESSION: No appreciable ventilation/ perfusion mismatch. No segmental sized perfusion defects. This study constitutes a low probability of pulmonary embolus. There is cardiomegaly. Electronically Signed   By: Lowella Grip III M.D.   On: 08/10/2015 15:16   Dg Chest Port 1 View  08/08/2015  CLINICAL DATA:  Shortness of breath EXAM: PORTABLE CHEST 1 VIEW COMPARISON:  08/01/2015 FINDINGS: Cardiac enlargement is identified. Increase in bilateral pleural effusions with progressive pulmonary edema. Aortic atherosclerosis noted. No airspace consolidation identified. IMPRESSION: 1. Cardiac enlargement and CHF. Electronically Signed   By: Kerby Moors M.D.   On: 08/08/2015 07:24   Dg Abd 2 Views  08/02/2015   CLINICAL DATA:  Vomiting x 1 hour. Pt denies any abdominal pain. Recent dx last week of PNA. Hx multiple abdominal conditions and surgeries. EXAM: ABDOMEN - 2 VIEW COMPARISON:  CT, 03/08/2015 FINDINGS: Normal bowel gas pattern.  No free air. There is a vague calcification adjacent to the L3 spinous process measuring approximately 16 mm greatest dimension. This represents a small nonspecific calcified mesenteric lesion on the prior CT, likely an area remote fat necrosis. No evidence of renal or ureteral stones. IMPRESSION: 1. No acute findings.  No evidence of obstruction or free air. Electronically Signed   By: Lajean Manes M.D.   On: 08/02/2015 19:40    ASSESSMENT AND PLAN  1. Chronic diastolic Heart Failure  - Echo 04/04/2015 EF 55-60%, no RWMA, mild AS, moderate pericardial effusion.  - Patient's volume status appears reasonable. Lasix was resumed at 80 mg daily by nephrology.  2. Acute on chronic renal insufficiency  - nephrology following; lasix resumed; follow renal function.  3. Pleuritic chest pain: trop flat, no further workup  4. PAF-was on Eliquis 5 mg orally twice a day; on hold due to anemia. Resume when Hgb stable. Continue amiodarone and beta blocker. Remains in sinus.  5. CAD s/p2 stents in 2013; continue statin.  6. COPD on home O2 at 2 liters  7. Acute on chronic anemia:eliquis on hold, now s/p transfusion.  Nees PT  Signed,  Ellis Parents

## 2015-08-12 NOTE — Plan of Care (Signed)
Problem: Nutrition: Goal: Adequate nutrition will be maintained Outcome: Not Progressing Pt has poor appetite and doesn't want to eat

## 2015-08-12 NOTE — Consult Note (Signed)
Consultation Note Date: 08/12/2015   Patient Name: Ricky Baker  DOB: March 30, 1936  MRN: [id number]  Age / Sex: 80 y.o., male  PCP: Brunetta Jeans, PA-C Referring Physician: Elwin Mocha, MD  Reason for Consultation: Establishing goals of care  HPI/Patient Profile: 80 y.o. male  with past medical history of  multiple medical conditions such as hypertension, paroxysmal atrial fibrillation, chronic kidney disease, diabetes, recent hospitalizations for pneumonia admitted on 08/01/2015 with worsening heart failure, illness of breath, pleuritic chest pain, worsening kidney function.  .   Clinical Assessment and Goals of Care:  80 year old gentleman past medical history significant for paroxysmal atrial fibrillation, coronary artery disease, chronic obstructive pulmonary disease patient is on home oxygen 2 L, congestive heart failure, hypertension, diabetes, dyslipidemia, diabetic neuropathy. Patient was recently hospitalized in March in [place] with sepsis and pneumonia.  The patient essentially has been having gradual progressive decline for the last months-couple of years now. The sisters were the primary historian present at the bedside state that the patient rather prefers a sedentary lifestyle, had self discontinued several of his medications, did not like checking his blood sugars, did not like taking his insulin. He was living independently, came from independent living. He was having ongoing decline leading up to this hospitalization.  Introduced palliative medicine as follows: Palliative medicine is specialized medical care for people living with serious illness. It focuses on providing relief from the symptoms and stress of a serious illness. The goal is to improve quality of life for both the patient and the family.  Discussed about the patient's several underlying conditions. Goals of care, CODE STATUS  discussions undertaken. CODE STATUS is now been established as DO NOT RESUSCITATE/DO NOT INTUBATE. Sister designated healthcare power of attorney agent. Disposition options discussed. Discussed monitoring patient's disease trajectory. Goals elicited. See recommendations below. Thank you for the consult.  HCPOA Sister Rod Holler is designated [employer], he also has another sister who is an Therapist, sports. Family makes decisions as a unit. He has no spouse, no kids.    SUMMARY OF RECOMMENDATIONS    1. CODE STATUS now established as DO NOT RESUSCITATE/DO NOT INTUBATE 2. Family and patient not in favor of other aggressive medical intervention such as dialysis or PEG tube's 3. Skilled nursing facility for rehabilitation on discharge. Palliative services to follow-up over there to monitor disease trajectory 4. Continue current scope of hospitalization.  Code Status/Advance Care Planning:  DNR   Symptom Management:    continue current care.   Palliative Prophylaxis:   Delirium Protocol  Psycho-social/Spiritual:   Desire for further Chaplaincy support:no  Additional Recommendations: Education on Hospice  Prognosis:   < 6 weeks ?  Discharge Planning: [facility] for rehab with Palliative care service follow-up       Primary Diagnoses: Present on Admission:  . Hyperglycemia . Type II diabetes mellitus, uncontrolled ([place]) . Major depressive disorder, recurrent episode, moderate ([place]) . Leukocytosis . Essential hypertension . CKD (chronic kidney disease) . Chronic respiratory failure ([place]) . PAF (paroxysmal atrial  fibrillation) ([place]) . Pleuritic chest pain . Acquired autoimmune hypothyroidism . Chest pain . Acute on chronic diastolic CHF (congestive heart failure), NYHA class 1 ([place])  I have reviewed the medical record, interviewed the patient and family, and examined the patient. The following aspects are pertinent.  Past Medical History  Diagnosis Date  . COPD (chronic  obstructive pulmonary disease) ([place])   . CHF (congestive heart failure) ([place])   . Thyroid disease   . Chronic bronchitis ([place])   . Atrial fibrillation ([place])   . CKD (chronic kidney disease)   . Diabetes mellitus without complication ([place])   . Allergy   . Esophageal cancer ([facility])     esophogeal   . CKD (chronic kidney disease)   . Depression   . Hypertension   . Colon polyps   . CAD (coronary artery disease)   . Hyperlipidemia   . GERD (gastroesophageal reflux disease)   . History of renal stone   . Stroke [facility])     ? TIA per pt  . Shortness of breath dyspnea     with exertion  . Pneumonia   . Dyslexia   . Enlarged prostate   . Headache     hx of - none in years  . Anemia   . Loose stools   . Flatulence/gas pain/belching    Social History   Social History  . Marital Status: Single    Spouse Name: N/A  . Number of Children: N/A  . Years of Education: N/A   Social History Main Topics  . Smoking status: Former Smoker -- 1.00 packs/day for 42 years    Types: Cigarettes    Quit date: 04/07/2001  . Smokeless tobacco: Never Used  . Alcohol Use: No  . Drug Use: No  . Sexual Activity: Not Asked   Other Topics Concern  . None   Social History Narrative   Family History  Problem Relation Age of Onset  . COPD Father   . Diabetes Neg Hx    Scheduled Meds: . amiodarone  200 mg Oral Daily  . antiseptic oral rinse  7 mL Mouth Rinse q12n4p  . atorvastatin  80 mg Oral Daily  . buPROPion  300 mg Oral Daily  . chlorhexidine  15 mL Mouth Rinse BID  . doxercalciferol  2.5 mcg Oral Daily  . DULoxetine  60 mg Oral Daily  . feeding supplement (ENSURE ENLIVE)  237 mL Oral TID BM  . fluticasone  2 spray Each Nare Daily  . furosemide  80 mg Oral Daily  . gabapentin  600 mg Oral TID  . heparin subcutaneous  5,000 Units Subcutaneous Q8H  . insulin aspart  0-15 Units Subcutaneous TID WC  . levothyroxine  112 mcg Oral QAC breakfast  . montelukast  10 mg Oral QHS  . nebivolol   2.5 mg Oral Daily  . pantoprazole  40 mg Oral BID  . ranolazine  500 mg Oral BID  . roflumilast  500 mcg Oral Daily  . senna  1 tablet Oral BID  . sodium bicarbonate  650 mg Oral BID  . sodium chloride  500 mL Intravenous Once  . testosterone  5 g Transdermal Daily  . [START ON 08/24/2015] Vitamin D (Ergocalciferol)  50,000 Units Oral Q30 days   Continuous Infusions:  PRN Meds:.acetaminophen, azelastine, bisacodyl, ipratropium-albuterol, olopatadine, ondansetron (ZOFRAN) IV, oxyCODONE-acetaminophen, technetium TC 2M diethylenetriame-pentaacetic acid Medications Prior to Admission:  Prior to Admission medications   Medication Sig Start Date End Date Taking? Authorizing  Provider  amiodarone (PACERONE) 200 MG tablet Take 1 tablet (200 mg total) by mouth 2 (two) times daily. 07/17/15  Yes Dorothy Spark, MD  apixaban (ELIQUIS) 5 MG TABS tablet Take 1 tablet (5 mg total) by mouth 2 (two) times daily. 07/17/15  Yes Dorothy Spark, MD  atorvastatin (LIPITOR) 80 MG tablet Take 1 tablet (80 mg total) by mouth daily. 08/31/14  Yes Elayne Snare, MD  azelastine (ASTELIN) 0.1 % nasal spray Place 2 sprays into both nostrils at bedtime as needed for rhinitis. Use in each nostril as directed 07/26/15  Yes Colon Branch, MD  buPROPion (WELLBUTRIN XL) 300 MG 24 hr tablet TAKE 1 TABLET(300 MG) BY MOUTH DAILY 07/23/15  Yes Brunetta Jeans, PA-C  doxercalciferol (HECTOROL) 2.5 MCG capsule TABLET 1 CAPSULE BY MOUTH DAILY 03/16/15  Yes Brunetta Jeans, PA-C  DULoxetine (CYMBALTA) 60 MG capsule Take 1 capsule (60 mg total) by mouth daily. 08/14/14  Yes Brunetta Jeans, PA-C  ergocalciferol (VITAMIN D2) 50000 units capsule Take 1 capsule (50,000 Units total) by mouth every 30 (thirty) days. 07/02/15  Yes Brunetta Jeans, PA-C  fluticasone (FLONASE) 50 MCG/ACT nasal spray Place 2 sprays into both nostrils daily. 07/12/15  Yes Edward Saguier, PA-C  Fluticasone Furoate-Vilanterol (BREO ELLIPTA IN) Inhale 1 puff into the lungs  daily.    Yes Historical Provider, MD  furosemide (LASIX) 40 MG tablet Take 40-80 mg by mouth daily. Take 40mg  when the fluid starts if continues Patient states he will take another pill   Yes Historical Provider, MD  gabapentin (NEURONTIN) 300 MG capsule TAKE 2 CAPSULES BY MOUTH THREE TIMES DAILY 10/06/14  Yes Brunetta Jeans, PA-C  Insulin Glargine (TOUJEO SOLOSTAR) 300 UNIT/ML SOPN Inject 60 Units into the skin every morning. 03/14/15  Yes Elayne Snare, MD  insulin lispro (HUMALOG KWIKPEN) 100 UNIT/ML KiwkPen Inject 20 units three times a day. Patient taking differently: Inject 10 Units into the skin 3 (three) times daily. Inject 20 units three times a day. 05/04/15  Yes Elayne Snare, MD  ipratropium-albuterol (DUONEB) 0.5-2.5 (3) MG/3ML SOLN Take 3 mLs by nebulization every 6 (six) hours as needed. Patient taking differently: Take 3 mLs by nebulization every 6 (six) hours as needed. Short 05/29/15  Yes Brunetta Jeans, PA-C  ketoconazole (NIZORAL) 2 % shampoo Apply 1 application topically 2 (two) times a week. 01/02/15  Yes Brunetta Jeans, PA-C  levothyroxine (SYNTHROID, LEVOTHROID) 112 MCG tablet Take 112 mcg by mouth daily before breakfast.   Yes Historical Provider, MD  montelukast (SINGULAIR) 10 MG tablet Take 10 mg by mouth at bedtime.   Yes Historical Provider, MD  nebivolol (BYSTOLIC) 10 MG tablet Take 10 mg by mouth daily.   Yes Historical Provider, MD  nystatin (MYCOSTATIN) 100000 UNIT/ML suspension Take 5 mLs (500,000 Units total) by mouth 4 (four) times daily. 05/18/15  Yes Mauri Pole, MD  olopatadine (PATANOL) 0.1 % ophthalmic solution Place 1 drop into both eyes 2 (two) times daily as needed. 05/29/15  Yes Brunetta Jeans, PA-C  ondansetron (ZOFRAN) 4 MG tablet Take 1 tablet (4 mg total) by mouth every 8 (eight) hours as needed for nausea or vomiting. 05/07/15  Yes Mauri Pole, MD  ranolazine (RANEXA) 500 MG 12 hr tablet Take 1 tablet (500 mg total) by mouth 2 (two) times  daily. 07/17/15  Yes Dorothy Spark, MD  roflumilast (DALIRESP) 500 MCG TABS tablet Take 500 mcg by mouth daily.   Yes Historical  Provider, MD  spironolactone (ALDACTONE) 25 MG tablet Take 25 mg by mouth daily.   Yes Historical Provider, MD  Testosterone (ANDROGEL) 20.25 MG/1.25GM (1.62%) GEL Apply 3 pumps on each arm daily (6) pumps total 03/08/15  Yes Elayne Snare, MD  tiotropium (SPIRIVA) 18 MCG inhalation capsule Place 18 mcg into inhaler and inhale daily.   Yes Historical Provider, MD  ACCU-CHEK AVIVA PLUS test strip TEST AS DIRECTED THREE TIMES DAILY 10/19/14   Brunetta Jeans, PA-C  diltiazem (CARDIZEM CD) 120 MG 24 hr capsule Take 1 capsule (120 mg total) by mouth daily. Patient not taking: Reported on 08/01/2015 03/22/15   Dorothy Spark, MD  Insulin Syringe-Needle U-100 [facility] INSULIN SYRINGE) 31G X 5/16" 0.3 ML MISC Use as directed 12/14/14   Elayne Snare, MD  OXYGEN Inhale into the lungs. 2L all day and night    Historical Provider, MD   Allergies  Allergen Reactions  . Dust Mite Extract Other (See Comments)    Runny nose   . Pollen Extract Other (See Comments)    Runny nose   Review of Systems + for nausea, weakness, abdominal pain.   Physical Exam Weak frail gentleman resting in bed Some mild lead going on Shallow regular breathing S1-S2 Abdomen soft mild distention Extremities some edema Overall fatigue opens eyes engages some answers a few questions appropriately but does not participate in lengthy discussions  Vital Signs: BP 119/56 mmHg  Pulse 79  Temp(Src) 97.6 F (36.4 C) (Oral)  Resp 18  Ht 5\' 11"  (1.803 m)  Wt 94.303 kg (207 lb 14.4 oz)  BMI 29.01 kg/m2  SpO2 97% Pain Assessment: 0-10   Pain Score: 7    SpO2: SpO2: 97 % O2 Device:SpO2: 97 % O2 Flow Rate: .O2 Flow Rate (L/min): 2 L/min  IO: Intake/output summary:  Intake/Output Summary (Last 24 hours) at 08/12/15 1336 Last data filed at 08/12/15 0522  Gross per 24 hour  Intake    300 ml    Output    525 ml  Net   -225 ml    LBM: Last BM Date: 08/11/15 Baseline Weight: Weight: 92.534 kg (204 lb) Most recent weight: Weight: 94.303 kg (207 lb 14.4 oz)     Palliative Assessment/Data:   Flowsheet Rows        Most Recent Value   Intake Tab    Referral Department  Hospitalist   Unit at Time of Referral  Cardiac/Telemetry Unit   Palliative Care Primary Diagnosis  Other (Comment)   Palliative Care Type  New Palliative care   Reason for referral  Clarify Goals of Care   Date first seen by Palliative Care  08/12/15   Clinical Assessment    Palliative Performance Scale Score  30%   Pain Max last 24 hours  5   Pain Min Last 24 hours  4   Dyspnea Max Last 24 Hours  4   Dyspnea Min Last 24 hours  3   Nausea Max Last 24 Hours  7   Nausea Min Last 24 Hours  6   Psychosocial & Spiritual Assessment    Palliative Care Outcomes    Patient/Family meeting held?  Yes   Who was at the meeting?  patient, 2 sisters.    Palliative Care follow-up planned  Yes, Facility      Time In: 12 Time Out: 1330 Time Total: 90 min  Greater than 50%  of this time was spent counseling and coordinating care related to the above assessment  and plan.  Signed by: Loistine Chance, MD  [id number] Please contact Palliative Medicine Team phone at [phone] for questions and concerns.  For individual provider: See Shea Evans

## 2015-08-12 NOTE — Progress Notes (Signed)
Patient ID: Ricky Baker, male   DOB: 02-10-1936, 80 y.o.   MRN: [id number]    PROGRESS NOTE    Ricky Baker  [id number] DOB: 1935-07-25 DOA: 08/01/2015  PCP: Leeanne Rio, PA-C   Outpatient Specialists:   Brief Narrative:  80 y.o. male with paroxysmal atrial fibrillation on Eliquis, chronic diastolic CHF, COPD with home O2 requirement, chronic kidney disease stage III, Hypothyroidism, depression, and esophageal cancer status post resection in 2006 presented to the ED at [place] [place] with chest pain of more than one week's duration.patient, PCP on 07/26/2015, diagnosed him with pneumonia based on chest x-ray, and treated with a course of Augmentin and prednisone.   ED Course: Upon arrival to the ED, patient was found to be afebrile, saturating well on 2 L/m, and with vital signs stable. Chest x-ray demonstrates resolution of the prior infiltrate, CBC with leukocytosis 21,400 and hemoglobin of 10.2.   Major events since admission: 5/3 - more dyspnea, CXR with vascular congestion, PCCM consulted  5/4 - intermittent confusion, more lethargic this AM 5/6 - patient more clear mentally, very alert & answering questions appropriately 5/7 - Palliative Care Team meeting. Pt now DNR. Palliative care to f/u with him on d/c. Hope to d/c to SNF tomorrow.  Assessment & Plan:  1. Acute encephalopathy, resolved - had progressive decline and more frequent episodes of confusion  - gabapentin held and better mental status, will not restart for now  2. Acute respiratory distress / COPD / Pulmonary vascular congestion - see below  - Requiring supplemental O2 around-the-clock at baseline; 2L - given total 60 mg lasix 5/3 which seemed it helped with dyspnea, pt breathing more comfortably this AM - IVF and bicarb have been stopped since 5/2 - Lasix restarted by nephro today 5/5 - Continue DuoNeb, Singulair QD  - sister who os POA said that pt has not been taking Eliquis at home  for several days due to malaise and poor oral intake - Eliquis was resumed initially but we held it since 5/2 due to drop in Hg - sister asked about V/Q scan to rule out PE, which is reasonable but she made it clear she did not wants him on blood thinner - she wants to hear from cardiologist and nephrologist first; still waiting for her to speak to specialits -Family to come in tomorrow which is Sunday to speak to palliative care, cardio and nephrology  3. Acute on Chronic diastolic CHF  - weight trend since admission: 200 --> 208 --> 213 --> 201 --> 209  --> 215 -->209-->213-->207 today - IVF have been stopped since 5/2 - gave 60 mg Lasix IV 5/3 and it seemed that it helped as pt less dyspneic this AM  - TTE (04/04/15) with EF 0000000, grade 1 diastolic dysfunction, mild AS  - earlier PCCM recommended holding further lasix until renal team sees - ECHO see below, aortic stenosis seen. Normal ejection fraction.  4. Paroxysmal atrial fibrillation - CHADS-VASc is 34 (age x2, CHF, DM) - pt on Eliquis but since Hg trending down, holding Eliquis since 5/2, marked improvement in anemia today - Continue rate-control with amiodarone, continue holding cardizem due to soft BP - pt already on home regimen Nebivolol, dose lowered to 2.5 mg PO QD due to lower BP   5. 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 Leukocytosis, anemia of chronic disease  - was on steroids recently but unclear why was trending up; on downward trend again today - has blood in urine but was determined to be due to foley cath placement  - has received 2u prbcs - certainly PE could be the cause of leukocytosis especially since off Eliquis but nuclear medicine scan was negative - CBC in AM  7. Acute  on CKD stage III, hyponatremia/ hyperkalemia/ metabolic acidosis  - SCr [id number] on admission, last check ~ 3.25 (slight improvement, maybe more improvement after blood)  - Avoiding nephrotoxins where possible  - gave does of Kayexalate 5/4 - renal US unremarkable, nephrology team consulted, assistance is appreciated  - BMP in AM  8. Depression  - Continue current-dose Wellbutrin   9. Vomiting on admission  - abd XRAY unremarkable - please note CT notable with dilated esophagus full of debris concerning for distal obstruction - d/w Dr. Henrene Pastor on call, no need for EGD at this time, pt actually tolerating diet well so far and vomiting has resolved - close outpatient follow up recommended with Dr. Pete Glatter  10. UTI - pt with chronic urinary retention from BPH, sees urologist - completed treatment with ABX Rocephin   11. Progressive decline and deconditioning - d/w family at length, they want to hear from cardiologist and nephrologist if pt would benefit from any more aggressive interventions such as HD - sister has living will but is not sure what pt's specific wishes are, she will bring this with her - offered PCT consultation as well, family will come in tomorrow, sensation -Patient to go to [facility] discharge  DVT prophylaxis: holding Eliquis due to low Hg Code Status: Full  Family Communication: Spoke to family at bedside today Disposition Plan: Needs SNF  Consultants:   GI over the phone   Nephrology team   PCCM  Cardiology   Procedures:  ECHO Study Conclusions  - Left ventricle: The cavity size was normal. Systolic function was  normal. The estimated ejection fraction was in the range of 60%  to 65%. Wall motion was normal; there were no regional wall  motion abnormalities. - Aortic valve: Severe diffuse thickening and calcification,  consistent with sclerosis. There was mild stenosis. - Mitral valve: Calcified annulus. - Pericardium, extracardiac: A moderate,  free-flowing pericardial  effusion was identified along the right ventricular free wall.  The fluid had no internal echoes. There was mildright ventricular chamber collapse for less than 50% of the cardiac cycle.  Antimicrobials:  Rocephin completed   Subjective: Appropriate this AM. Discussed risks and benefits of transfusion, pt VU. C/o being weak. States he needs help on d/c. Wants placement.   Objective: Filed Vitals:   08/12/15 0400 08/12/15 0520 08/12/15 0800 08/12/15 0846  BP: 118/58 118/58 120/67   Pulse: 75 76 77   Temp:  97.4 F (36.3 C)  97.5 F (36.4 C)  TempSrc:    Oral  Resp: 16 12 17    Height:      Weight:  94.303 kg (207 lb 14.4 oz)    SpO2: 99% 97% 97%     Intake/Output Summary (Last 24 hours) at 08/12/15 1045 Last data filed at 08/12/15 0522  Gross per 24 hour  Intake    300 ml  Output   1125 ml  Net   -825 ml   [employer]  08/10/15 0500 08/11/15 0500 08/12/15 0520  Weight: 95.119 kg (209 lb 11.2 oz) 96.934 kg (213 lb 11.2 oz) 94.303 kg (207 lb 14.4 oz)    Examination:  General exam: NAD, A&Ox3 Respiratory system: Diminished breath sounds at bases, nl wob Cardiovascular system: S1 & S2 heard, RRR. SEM 2/6, No JVD, rubs, gallops or clicks. No pedal edema. Gastrointestinal system: Abdomen is slightly distended, NTTP Central nervous system: Appropriate, moving all 4 extremities spontaneously when awake   Data Reviewed: I have personally reviewed following labs and imaging studies  CBC:  Recent Labs Lab 08/08/15 0444 08/09/15 0324 08/10/15 0430 08/11/15 0454 08/12/15 0502  WBC 19.9* 19.9* 17.1* 16.8* 14.0*  NEUTROABS  --   --   --   --  12.1*  HGB 7.9* 7.6* 7.2* 9.7* 9.7*  HCT 25.2* 23.7* 22.6* 29.5* 30.2*  MCV 88.7 88.8 88.3 87.0 86.8  PLT 458* 464* 435* 369 [id number]   Basic Metabolic Panel:  Recent Labs Lab 08/05/15 1656  08/06/15 0532 08/07/15 0835 08/08/15 0444 08/09/15 0324 08/09/15 1136 08/10/15 0430 08/12/15 0502  NA   --   < > 130* 129* 130* 132* 133* 135 138  K  --   < > 5.0 5.3* 5.1 5.3* 5.3* 4.3 4.1  CL  --   < > 102 101 100* 100* 102 103 102  CO2  --   < > 16* 16* 18* 16* 17* 18* 22  GLUCOSE  --   < > 140* 149* 128* 123* 159* 169* 137*  BUN  --   < > 63* 65* 64* 73* 77* 71* 69*  CREATININE  --   < > 3.58* 3.44* 3.42* 3.47* 3.59* 3.25* 3.03*  CALCIUM  --   < > 8.1* 8.0* 8.2* 8.2* 8.4* 8.2* 8.4*  MG 1.9  --   --   --   --   --   --   --   --   PHOS  --   --  4.9* 4.5  --   --  4.6  --  3.9  < > = values in this interval not displayed. Liver Function Tests:  Recent Labs Lab 08/06/15 0532 08/07/15 0835 08/09/15 1136 08/12/15 0502  ALBUMIN 2.1* 2.1* 2.3* 1.9*   Cardiac Enzymes:  Recent Labs Lab 08/05/15 1656 08/08/15 0444 08/09/15 0324  CKTOTAL 449* 66 67   BNP (last 3 results)  Recent Labs  10/30/14 1056  PROBNP 117.0*   CBG:  Recent Labs Lab 08/11/15 0738 08/11/15 1119 08/11/15 1632 08/11/15 2107 08/12/15 0745  GLUCAP 156* 158* 158* 147* 130*    Radiology Studies: Dg Chest 2 View 08/01/2015   No acute chest findings.   Marland Kitchen amiodarone  200 mg Oral BID  . aspirin EC  81 mg Oral Daily  . atorvastatin  80 mg Oral Daily  . buPROPion  300 mg Oral Daily  . doxercalciferol  2.5 mcg Oral Daily  . DULoxetine  60 mg Oral Daily  . fluticasone  2 spray Each Nare Daily  . gabapentin  600 mg Oral TID  . insulin aspart  0-15 Units Subcutaneous TID WC  . ipratropium-albuterol  3 mL Nebulization Q6H  . levothyroxine  112 mcg Oral QAC breakfast  . montelukast  10 mg Oral QHS  . nebivolol  2.5 mg Oral Daily  . pantoprazole  40 mg Oral Daily  . ranolazine  500 mg Oral BID  . roflumilast  500 mcg Oral Daily  . senna  1 tablet Oral  BID  . sodium bicarbonate  650 mg Oral BID  . sodium polystyrene  30 g Oral Once  . testosterone  5 g Transdermal Daily     LOS: 11 days   Time spent: 20 minutes   Elwin Mocha, MD [facility] Pager [phone]  If 7PM-7AM, please  contact night-coverage [email or web address] Password [email or web address] 08/12/2015, 10:45 AM

## 2015-08-13 DIAGNOSIS — D638 Anemia in other chronic diseases classified elsewhere: Secondary | ICD-10-CM

## 2015-08-13 DIAGNOSIS — I3139 Other pericardial effusion (noninflammatory): Secondary | ICD-10-CM | POA: Diagnosis present

## 2015-08-13 DIAGNOSIS — N183 Chronic kidney disease, stage 3 (moderate): Secondary | ICD-10-CM

## 2015-08-13 DIAGNOSIS — I313 Pericardial effusion (noninflammatory): Secondary | ICD-10-CM | POA: Diagnosis present

## 2015-08-13 DIAGNOSIS — N390 Urinary tract infection, site not specified: Secondary | ICD-10-CM

## 2015-08-13 DIAGNOSIS — R778 Other specified abnormalities of plasma proteins: Secondary | ICD-10-CM | POA: Insufficient documentation

## 2015-08-13 DIAGNOSIS — R7989 Other specified abnormal findings of blood chemistry: Secondary | ICD-10-CM

## 2015-08-13 LAB — GLUCOSE, CAPILLARY
GLUCOSE-CAPILLARY: 140 mg/dL — AB (ref 65–99)
GLUCOSE-CAPILLARY: 154 mg/dL — AB (ref 65–99)
Glucose-Capillary: 128 mg/dL — ABNORMAL HIGH (ref 65–99)

## 2015-08-13 MED ORDER — ENSURE ENLIVE PO LIQD: 237.0000 mL | Freq: Three times a day (TID) | ORAL | Status: DC

## 2015-08-13 MED ORDER — FUROSEMIDE 80 MG PO TABS: 80.0000 mg | ORAL_TABLET | Freq: Every day | ORAL | Status: AC

## 2015-08-13 MED ORDER — APIXABAN 2.5 MG PO TABS: 2.5000 mg | ORAL_TABLET | Freq: Two times a day (BID) | ORAL | Status: AC

## 2015-08-13 MED ORDER — NEBIVOLOL HCL 2.5 MG PO TABS: 2.5000 mg | ORAL_TABLET | Freq: Every day | ORAL | Status: AC

## 2015-08-13 MED ORDER — APIXABAN 2.5 MG PO TABS
2.5000 mg | ORAL_TABLET | Freq: Two times a day (BID) | ORAL | Status: DC
  Administered 2015-08-13: 2.5 mg via ORAL
  Filled 2015-08-13: qty 1

## 2015-08-13 MED ORDER — SENNA 8.6 MG PO TABS: 1.0000 | ORAL_TABLET | Freq: Two times a day (BID) | ORAL | Status: AC

## 2015-08-13 MED ORDER — SODIUM BICARBONATE 650 MG PO TABS
650.0000 mg | ORAL_TABLET | Freq: Two times a day (BID) | ORAL | Status: AC
Start: 2015-08-13 — End: ?

## 2015-08-13 MED ORDER — ACETAMINOPHEN 325 MG PO TABS: 650.0000 mg | ORAL_TABLET | ORAL | Status: AC | PRN

## 2015-08-13 MED ORDER — INSULIN ASPART 100 UNIT/ML ~~LOC~~ SOLN: 0.0000 [IU] | Freq: Three times a day (TID) | SUBCUTANEOUS | Status: DC

## 2015-08-13 MED ORDER — OXYCODONE-ACETAMINOPHEN 5-325 MG PO TABS: 1.0000 | ORAL_TABLET | ORAL | Status: AC | PRN

## 2015-08-13 MED ORDER — PANTOPRAZOLE SODIUM 40 MG PO TBEC: 40.0000 mg | DELAYED_RELEASE_TABLET | Freq: Two times a day (BID) | ORAL | Status: AC

## 2015-08-13 MED ORDER — AMIODARONE HCL 200 MG PO TABS: 200.0000 mg | ORAL_TABLET | Freq: Every day | ORAL | Status: AC

## 2015-08-13 NOTE — Progress Notes (Signed)
SUBJECTIVE: Ricky Baker is a 80 yo male with PMHx of PAF on Eliquis, CAD s/p stent placement x 2 in 2013, COPD on 2L O2, Chronic Diastolic CHF, HTN, [id number], HLD, and CKD IV who was admitted on 4/26 with chest pain (deemed 2/2 MSK etiology) and acute on chronic diastolic CHF with hospital course complicated by AMS and acute on chronic renal insufficiency. Cardiology was initially consulted on 4/27 for atypical chest pain.   Patient was seen and examined this morning. He denies any chest pain or increased shortness of breath. He does complain of nausea, but denies vomiting. He denies increased swelling.   OBJECTIVE:   Vitals:   Filed Vitals:   08/13/15 0331 08/13/15 0400 08/13/15 0800 08/13/15 0844  BP:  108/60 122/55   Pulse:  80 79   Temp: 97.8 F (36.6 C)   98.3 F (36.8 C)  TempSrc: Oral   Oral  Resp:  14 16   Height:      Weight: 207 lb 12.8 oz (94.257 kg)     SpO2:  95% 96%    I&O's:    Intake/Output Summary (Last 24 hours) at 08/13/15 0957 Last data filed at 08/13/15 0841  Gross per 24 hour  Intake    360 ml  Output   1125 ml  Net   -765 ml   TELEMETRY: Reviewed telemetry pt in sinus rhythm, HR 80:  PHYSICAL EXAM Filed Vitals:   08/13/15 0331 08/13/15 0400 08/13/15 0800 08/13/15 0844  BP:  108/60 122/55   Pulse:  80 79   Temp: 97.8 F (36.6 C)   98.3 F (36.8 C)  TempSrc: Oral   Oral  Resp:  14 16   Height:      Weight: 207 lb 12.8 oz (94.257 kg)     SpO2:  95% 96%    General: Vital signs reviewed.  Patient is elderly, in no acute distress and cooperative with exam.  Neck: No carotid bruit, no JVD. Cardiovascular: RRR, S1 normal, S2 normal. Pulmonary/Chest: Decreased breath sounds, mild inspiratory crackles in bases bilaterally, no wheezes, or rhonchi. Abdominal: Soft, non-tender, non-distended, BS +  Extremities: No lower extremity edema bilaterally. Psychiatric: Normal mood and affect. speech and behavior is normal. Cognition and memory are grossly normal.    LABS: Basic Metabolic Panel:  Recent Labs  08/12/15 0502  NA 138  K 4.1  CL 102  CO2 22  GLUCOSE 137*  BUN 69*  CREATININE 3.03*  CALCIUM 8.4*  PHOS 3.9   Liver Function Tests:  Recent Labs  08/12/15 0502  ALBUMIN 1.9*   CBC:  Recent Labs  08/11/15 0454 08/12/15 0502  WBC 16.8* 14.0*  NEUTROABS  --  12.1*  HGB 9.7* 9.7*  HCT 29.5* 30.2*  MCV 87.0 86.8  PLT 369 328   RADIOLOGY: Dg Chest 2 View  08/09/2015  CLINICAL DATA:  Shortness of breath. EXAM: CHEST  2 VIEW COMPARISON:  08/08/2015. FINDINGS: Mediastinum and hilar structures normal. Cardiomegaly again noted. Interim partial clearing of bilateral pulmonary infiltrates/edema. Small bilateral pleural effusions again noted. No pneumothorax . IMPRESSION: Congestive heart failure with interim partial clearing of pulmonary edema. Small bilateral pleural effusions. Electronically Signed   By: Marcello Moores  Register   On: 08/09/2015 08:18   Dg Chest 2 View  08/01/2015  CLINICAL DATA:  Chest pains and cough. Sore throat. History of esophageal cancer. EXAM: CHEST  2 VIEW COMPARISON:  07/26/2015 FINDINGS: Lungs are clear bilaterally. Retrocardiac density is compatible with history of esophageal  cancer and previous gastric pull-through procedure. There is a nodular density in the right lower chest which was present on the exam from 09/21/2014 and may be related to a nipple shadow. Atherosclerotic calcifications in the thoracic aorta. IMPRESSION: No acute chest findings. Electronically Signed   By: Markus Daft M.D.   On: 08/01/2015 16:04   Dg Chest 2 View  07/26/2015  CLINICAL DATA:  Cough, fever, and chest congestion for the past week ; history of COPD, former smoker, history of pneumonia several months ago. EXAM: CHEST  2 VIEW COMPARISON:  PA and lateral chest x-ray of October 25, 2014 FINDINGS: The lungs remain mildly hyperinflated with hemidiaphragm flattening. The lung markings are coarse in the right lower lobe posteriorly and are more  conspicuous than on the previous study. The left lung is clear. The heart and pulmonary vascularity are normal. The mediastinum is normal in width. There is no pleural effusion. There is mild multilevel degenerative disc disease of the thoracic spine. IMPRESSION: COPD. Posterior left lower lobe infiltrate. There is no pleural effusion or evidence of CHF. Followup PA and lateral chest X-ray is recommended in 3-4 weeks following trial of antibiotic therapy to ensure resolution and exclude underlying malignancy. Electronically Signed   By: David  Martinique M.D.   On: 07/26/2015 12:23   Ct Chest Wo Contrast  08/04/2015  CLINICAL DATA:  Dyspnea. EXAM: CT CHEST WITHOUT CONTRAST TECHNIQUE: Multidetector CT imaging of the chest was performed following the standard protocol without IV contrast. COMPARISON:  Chest radiograph of August 01, 2015. CT scan of November 06, 2004. FINDINGS: No pneumothorax is noted. Minimal bilateral pleural effusions are noted with adjacent subsegmental atelectasis. Atherosclerosis of thoracic aorta is noted without aneurysm. Mild pericardial effusion is noted. Aberrant right subclavian artery is noted. Visualized portion of upper abdomen is unremarkable. Aortic valve calcifications are noted. Coronary artery calcifications are noted as well. No mediastinal adenopathy is noted. The esophagus is dilated and full of debris concerning for distal obstruction. The patient is status post esophageal surgery. IMPRESSION: Minimal bilateral posterior basilar pleural effusions are noted with adjacent subsegmental atelectasis. Atherosclerosis thoracic aorta is noted without aneurysm formation. Mild pericardial effusion is noted. Aortic valve calcifications are noted. Coronary artery calcifications are noted suggesting coronary artery disease. Dilated esophagus is noted full of debris concerning for distal obstruction. The patient is status post esophageal surgery. Endoscopy is recommended for further evaluation.  Electronically Signed   By: Marijo Conception, M.D.   On: 08/04/2015 15:08   US Renal  08/03/2015  CLINICAL DATA:  80 year old male with renal failure. EXAM: RENAL / URINARY TRACT ULTRASOUND COMPLETE COMPARISON:  Noncontrast CT 03/08/2015 FINDINGS: Right Kidney: Length: 11.4 cm. Echogenicity within normal limits. Mild thinning of renal parenchyma. No mass or hydronephrosis visualized. Lower pole calculi on prior CT are not visualized sonographically. Left Kidney: Length: 9.8 cm. Echogenicity within normal limits. There is thinning of renal parenchyma. No mass or hydronephrosis visualized. The cyst in the upper left kidney on prior CT is not visualized sonographically. Bladder: Decompressed by Foley catheter and not well evaluated. IMPRESSION: No obstructive uropathy. There is thinning of both renal parenchyma consistent with chronic medical renal disease. Electronically Signed   By: Jeb Levering M.D.   On: 08/03/2015 19:15   Nm Pulmonary Perf And Vent  08/10/2015  CLINICAL DATA:  Shortness of breath and chest pain EXAM: NUCLEAR MEDICINE VENTILATION - PERFUSION LUNG SCAN Views: Anterior, posterior, LPO, RPO, LAO, RAO -ventilation and perfusion RADIOPHARMACEUTICALS:  [id number] millicurie  mCi Technetium-40m DTPA aerosol inhalation and 4.3 millicurie mCi [id number] MAA IV COMPARISON:  Chest radiograph Aug 09, 2015 FINDINGS: Ventilation: There are scattered small nonsegmental ventilation defects bilaterally. Perfusion: There are a few nonsegmental perfusion defects which match ventilation defects. There is no appreciable ventilation/perfusion mismatch. There is no segmental level perfusion defect. Cardiomegaly is noted. IMPRESSION: No appreciable ventilation/ perfusion mismatch. No segmental sized perfusion defects. This study constitutes a low probability of pulmonary embolus. There is cardiomegaly. Electronically Signed   By: Lowella Grip III M.D.   On: 08/10/2015 15:16   Dg Chest Port 1 View  08/08/2015   CLINICAL DATA:  Shortness of breath EXAM: PORTABLE CHEST 1 VIEW COMPARISON:  08/01/2015 FINDINGS: Cardiac enlargement is identified. Increase in bilateral pleural effusions with progressive pulmonary edema. Aortic atherosclerosis noted. No airspace consolidation identified. IMPRESSION: 1. Cardiac enlargement and CHF. Electronically Signed   By: Kerby Moors M.D.   On: 08/08/2015 07:24   Dg Abd 2 Views  08/02/2015  CLINICAL DATA:  Vomiting x 1 hour. Pt denies any abdominal pain. Recent dx last week of PNA. Hx multiple abdominal conditions and surgeries. EXAM: ABDOMEN - 2 VIEW COMPARISON:  CT, 03/08/2015 FINDINGS: Normal bowel gas pattern.  No free air. There is a vague calcification adjacent to the L3 spinous process measuring approximately 16 mm greatest dimension. This represents a small nonspecific calcified mesenteric lesion on the prior CT, likely an area remote fat necrosis. No evidence of renal or ureteral stones. IMPRESSION: 1. No acute findings.  No evidence of obstruction or free air. Electronically Signed   By: Lajean Manes M.D.   On: 08/02/2015 19:40   ASSESSMENT/PLAN: Mr. Fiss is a 80 yo male with PMHx of PAF on Eliquis, CAD s/p stent placement x 2 in 0000000, Chronic Diastolic CHF who was admitted on 4/26 with chest pain.   Chronic Diastolic CHF: Echo in December 2016 showed EF 55-60%, no RWMA, mild AS and moderate pericardial effusion. Patient appears euvolemic on exam. Net -765 yesterday, net -1,604 total since admission. -On Lasix 80 mg daily -On nebivolol 2.5 mg daily (lowered from 10 mg d/t BP)  PAF: Currently in sinus rhythm, HR 80. Previously on Eliquis 5 mg BID, but holding due to acute on chronic anemia. Hemoglobin stable at 9.7 over the past 2 days. Patient has a history of PUD and gastritis seen on EGD in February 2017. Given his risk of recurrent bleed and CKD IV, we will restart his Eliquis at a lower dose 2.5 mg BID. It will be important for patient and physicians to continue  to monitor for signs and symptoms of recurrent bleed.  -Restart Eliquis at 2.5 mg BID, monitor for signs of bleeding -Continue amiodarone 200 mg daily -Continue nebivolol 2.5 mg daily -Ok to discharge with close cardiology follow up  CAD s/p PCI in 2013: Patient denies chest pain. Not on ASA as patient is on Eliquis.  -Continue Ranexa 500 mg BID -Continue atorvastatin 80 mg daily -Continue nebivolol 2.5 mg daily -Restart Eliquis at 2.5 mg BID   Pleuritic Chest Pain: Resolved. Mild elevated of troponins with flat trend. EKG without ischemic changes. Deemed 2/2 to CKD. Pain was nonexertional, worse with deep inspiration and cough.  -No further work up  CKD IV: Creatinine 3.03, baseline appears to be 2.8. Per Palliative's note, patient is not interested in HD.  -Nephrology following -Lasix 80 mg daily  Martyn Malay, DO PGY-2 Internal Medicine Resident Pager # [phone] 08/13/2015 9:57 AM

## 2015-08-13 NOTE — Progress Notes (Signed)
Called report to [place], Therapist, sports at [facility].  All questions answered.

## 2015-08-13 NOTE — Clinical Social Work Placement (Signed)
   CLINICAL SOCIAL WORK PLACEMENT  NOTE  Date:  08/13/2015  Patient Details  Name: Ricky Baker MRN: [id number] Date of Birth: 1935/09/24  Clinical Social Work is seeking post-discharge placement for this patient at the [place] level of care (*CSW will initial, date and re-position this form in  chart as items are completed):  Yes   Patient/family provided with [facility] Work Department's list of facilities offering this level of care within the geographic area requested by the patient (or if unable, by the patient's family).  Yes   Patient/family informed of their freedom to choose among providers that offer the needed level of care, that participate in Medicare, Medicaid or managed care program needed by the patient, have an available bed and are willing to accept the patient.  Yes   Patient/family informed of [facility]'s ownership interest in [facility] and [facility], as well as of the fact that they are under no obligation to receive care at these facilities.  PASRR submitted to EDS on 08/08/15     PASRR number received on 08/08/15     Existing PASRR number confirmed on       FL2 transmitted to all facilities in geographic area requested by pt/family on 08/08/15     FL2 transmitted to all facilities within larger geographic area on       Patient informed that his/her managed care company has contracts with or will negotiate with certain facilities, including the following:        Yes   Patient/family informed of bed offers received.  Patient chooses bed at [facility]     Physician recommends and patient chooses bed at      Patient to be transferred to [facility] on 08/13/15.  Patient to be transferred to facility by Ambulance     Patient family notified on 08/13/15 of transfer.  Name of family member notified:  Rod Holler     PHYSICIAN Please prepare priority discharge summary, including medications, Please prepare  prescriptions, Please sign FL2, Please sign DNR     Additional Comment:   Per MD patient ready for DC to [facility]. RN, patient, patient's family, and facility notified of DC. RN given number for report. DC packet on chart. Ambulance transport requested for patient. CSW signing off.  _______________________________________________ Rigoberto Noel, LCSW 08/13/2015, 4:39 PM

## 2015-08-13 NOTE — Care Management Important Message (Signed)
Important Message  Patient Details  Name: Ricky Baker MRN: [id number] Date of Birth: 12-17-1935   Medicare Important Message Given:  Yes    Bethena Roys, RN 08/13/2015, 12:09 PM

## 2015-08-13 NOTE — Discharge Summary (Addendum)
Physician Discharge Summary  DEUCE CALVERLEY [id number] DOB: 08/30/1935 DOA: 08/01/2015  PCP: Leeanne Rio, PA-C  Admit date: 08/01/2015 Discharge date: 08/13/2015  Recommendations for Outpatient Follow-up:  Patient can continue sliding scale insulin on discharge. Patient was on insulin glargine and lispro at home however his CBGs were well controlled with sliding-scale insulin only. Is CBGs trend up, please consider resuming his home regimen insulin. Please note - Managed with Lantus 60 units qD and Humalog 20 units TID at home  Continue Eliquis 2.5 mg twice daily per cardiology recommendation. Please note patient is also on Lasix 80 mg daily. Monitor renal function per skilled nursing facility protocol.  Please continue palliative care services in skilled nursing facility, continue to address goals of care.  Discharge Diagnoses:  Principal Problem:   Pleuritic chest pain Active Problems:   Acquired autoimmune hypothyroidism   History of coronary artery disease   Major depressive disorder, recurrent episode, moderate (HCC)   Essential hypertension   PAF (paroxysmal atrial fibrillation) (HCC)   Type II diabetes mellitus, uncontrolled (HCC)   Chronic respiratory failure (HCC)   CKD (chronic kidney disease)   Leukocytosis   Hyperglycemia   Chronic renal insufficiency   Moderate aortic stenosis by prior echocardiogram   CAD S/P percutaneous coronary angioplasty   Chest pain   Acute on chronic diastolic CHF (congestive heart failure), NYHA class 1 ([place])   Dyspnea   Encounter for palliative care   Goals of care, counseling/discussion   Pericardial effusion   Elevated troponin    Discharge Condition: stable   Diet recommendation: as tolerated   History of present illness:  Part brief narrative 08/12/2015 "80 y.o. male with paroxysmal atrial fibrillation on Eliquis, chronic diastolic CHF, COPD with home O2 requirement, chronic kidney disease stage III,  Hypothyroidism, depression, and esophageal cancer status post resection in 2006 presented to the ED at [facility] with chest pain of more than one week's duration.patient, PCP on 07/26/2015, diagnosed him with pneumonia based on chest x-ray, and treated with a course of Augmentin and prednisone.   ED Course: Upon arrival to the ED, patient was found to be afebrile, saturating well on 2 L/m, and with vital signs stable. Chest x-ray demonstrates resolution of the prior infiltrate, CBC with leukocytosis 21,400 and hemoglobin of 10.2.   Major events since admission: 5/3 - more dyspnea, CXR with vascular congestion, PCCM consulted  5/4 - intermittent confusion, more lethargic this AM 5/6 - patient more clear mentally, very alert & answering questions appropriately 5/7 - Palliative Care Team meeting. Pt now DNR. Palliative care to f/u with him on d/c"   Hospital Course:   Assessment & Plan:   Acute encephalopathy, resolved - had progressive decline and more frequent episodes of confusion  - gabapentin held and better mental status, will not restart for now  Acute respiratory distress with hypoxia / COPD / Pulmonary vascular congestion   - Requiring supplemental O2 around-the-clock at baseline; 2L - Given total 60 mg lasix 5/3 which seemed it helped with dyspnea, pt breathing more comfortably  - IVF and bicarb have been stopped since 5/2 - Lasix restarted by nephro 5/5; he will continue 80 mg daily  - Eliquis was resumed 2.5 mg BID - Continue DuoNeb, Singulair QD   Acute on Chronic diastolic CHF  - IVF have been stopped since 5/2 - Will continue lasix 80 mg daily on discharge  - TTE (04/04/15) with EF 0000000, grade 1 diastolic dysfunction, mild AS  -  ECHO see below, aortic stenosis seen. Normal ejection fraction.  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 Please resume home regimen once by mouth intake improves  Anemia of chronic disease  - Hemoglobin stable - Patient has received 2 units of PRBC during this hospital stay  Acute on CKD stage III / hyponatremia/ hyperkalemia/ metabolic acidosis  - SCr [id number] on admission, previous 3.25 - Cr improving  - Avoiding nephrotoxins where possible; per renal, patient is on Lasix to monitor renal function per skilled nursing facility protocol - Given does of Kayexalate 5/4 - Renal US unremarkable, nephrology team consulted  Depression  - Continue Wellbutrin   Vomiting on admission  - Abd XRAY unremarkable - Please note CT notable with dilated esophagus full of debris concerning for distal obstruction - per Dr. Henrene Pastor - no need for EGD at this time, pt actually tolerating diet well so far and vomiting has resolved  UTI due to yeast / leukocytosis  - Pt with chronic urinary retention from BPH, sees urologist - Completed treatment with ABX Rocephin  - Urine cultures from 08/08/2015 grew yeast, pt was not treated with antifungal at that time and since no symptoms at this point, no fevers will defer antifungal treatment   Progressive decline and deconditioning - To SNF, pt and family agree     DVT prophylaxis: on Eliquis per cardio  Code Status: DNR/DNI Family Communication: no family at the bedside   Consultants:   GI over the phone   Nephrology   PCCM  Cardiology  Procedures:  ECHO Study Conclusions - Left ventricle: The cavity size was normal. Systolic function was  normal. The estimated ejection fraction was in the range of 60%  to 65%. Wall motion was normal; there were no regional wall   motion abnormalities. - Aortic valve: Severe diffuse thickening and calcification,  consistent with sclerosis. There was mild stenosis. - Mitral valve: Calcified annulus. - Pericardium, extracardiac: A moderate, free-flowing pericardial  effusion was identified along the right ventricular free wall.  The fluid had no internal echoes. There was mildright ventricular chamber collapse for less than 50% of the cardiac cycle.  Antimicrobials:  Rocephin completed   Signed:  Leisa Lenz, MD  [facility] 08/13/2015, 11:21 AM  Pager #: [phone]  Time spent in minutes: more than 30 minutes   Discharge Exam: Filed Vitals:   08/13/15 0800 08/13/15 0844  BP: 122/55   Pulse: 79   Temp:  98.3 F (36.8 C)  Resp: 16    Filed Vitals:   08/13/15 0331 08/13/15 0400 08/13/15 0800 08/13/15 0844  BP:  108/60 122/55   Pulse:  80 79   Temp: 97.8 F (36.6 C)   98.3 F (36.8 C)  TempSrc: Oral   Oral  Resp:  14 16   Height:      Weight: 94.257 kg (207 lb 12.8 oz)     SpO2:  95% 96%     General: Pt is alert, not in acute distress Cardiovascular: Regular rate and rhythm, S1/S2 + Respiratory: Clear to auscultation bilaterally, no wheezing, no crackles,  no rhonchi Abdominal: Soft, non tender, non distended, bowel sounds +, no guarding Extremities:no cyanosis, pulses palpable bilaterally DP and PT Neuro: Grossly nonfocal  Discharge Instructions  Discharge Instructions    Call MD for:  difficulty breathing, headache or visual disturbances    Complete by:  As directed      Call MD for:  persistant dizziness or light-headedness    Complete by:  As directed      Call MD for:  persistant nausea and vomiting    Complete by:  As directed      Call MD for:  severe uncontrolled pain    Complete by:  As directed      Diet - low sodium heart healthy    Complete by:  As directed      Discharge instructions    Complete by:  As directed   Patient can continue sliding scale insulin  on discharge. Patient was on insulin glargine and lispro at home however his CBGs were well controlled with sliding-scale insulin only. Is CBGs trend up, please consider resuming his home regimen insulin. Continue Eliquis 2.5 mg twice daily per cardiology recommendation. Please note patient is also on Lasix 80 mg daily. Monitor renal function per skilled nursing facility protocol.     Increase activity slowly    Complete by:  As directed             Medication List    STOP taking these medications        diltiazem 120 MG 24 hr capsule  Commonly known as:  CARDIZEM CD     Insulin Glargine 300 UNIT/ML Sopn  Commonly known as:  TOUJEO SOLOSTAR     insulin lispro 100 UNIT/ML KiwkPen  Commonly known as:  HUMALOG KWIKPEN     nystatin 100000 UNIT/ML suspension  Commonly known as:  MYCOSTATIN     omeprazole 20 MG capsule  Commonly known as:  PRILOSEC  Replaced by:  pantoprazole 40 MG tablet     spironolactone 25 MG tablet  Commonly known as:  ALDACTONE      TAKE these medications        ACCU-CHEK AVIVA PLUS test strip  Generic drug:  glucose blood  TEST AS DIRECTED THREE TIMES DAILY     acetaminophen 325 MG tablet  Commonly known as:  TYLENOL  Take 2 tablets (650 mg total) by mouth every 4 (four) hours as needed for headache or mild pain.     amiodarone 200 MG tablet  Commonly known as:  PACERONE  Take 1 tablet (200 mg total) by mouth daily.     apixaban 2.5 MG Tabs tablet  Commonly known as:  ELIQUIS  Take 1 tablet (2.5 mg total) by mouth 2 (two) times daily.     atorvastatin 80 MG tablet  Commonly known as:  LIPITOR  Take 1 tablet (80 mg total) by mouth daily.     azelastine 0.1 % nasal spray  Commonly known as:  ASTELIN  Place 2 sprays into both nostrils at bedtime as needed for rhinitis. Use in each nostril as directed     BREO ELLIPTA IN  Inhale 1 puff into the lungs daily.     buPROPion 300 MG 24 hr tablet  Commonly known as:  WELLBUTRIN XL  TAKE 1  TABLET(300 MG) BY MOUTH DAILY     doxercalciferol 2.5 MCG capsule  Commonly known as:  HECTOROL  TABLET 1 CAPSULE BY MOUTH DAILY     DULoxetine 60 MG capsule  Commonly known as:  CYMBALTA  Take 1 capsule (60 mg total) by mouth daily.     ergocalciferol 50000 units capsule  Commonly known as:  VITAMIN D2  Take 1 capsule (50,000 Units total) by mouth every 30 (thirty) days.     feeding supplement (ENSURE ENLIVE) Liqd  Take 237 mLs by mouth 3 (three) times daily between meals.     fluticasone 50 MCG/ACT nasal spray  Commonly known as:  FLONASE  Place 2 sprays into both nostrils daily.     furosemide 80 MG tablet  Commonly known as:  LASIX  Take 1 tablet (80 mg total) by mouth daily.     insulin aspart 100 UNIT/ML injection  Commonly known as:  novoLOG  Inject 0-15 Units into the skin 3 (three) times daily with meals.     Insulin Syringe-Needle U-100 31G X 5/16" 0.3 ML Misc  Commonly known as:  LITETOUCH INSULIN SYRINGE  Use as directed     ipratropium-albuterol 0.5-2.5 (3) MG/3ML Soln  Commonly known as:  DUONEB  Take 3 mLs by nebulization every 6 (six) hours as needed.     ketoconazole 2 % shampoo  Commonly known as:  NIZORAL  Apply 1 application topically 2 (two) times a week.     levothyroxine 112 MCG tablet  Commonly known as:  SYNTHROID, LEVOTHROID  Take 112 mcg by mouth daily before breakfast.     montelukast 10 MG tablet  Commonly known as:  SINGULAIR  Take 10 mg by mouth at bedtime.     nebivolol 2.5 MG tablet  Commonly known as:  BYSTOLIC  Take 1 tablet (2.5 mg total) by mouth daily.     olopatadine 0.1 % ophthalmic solution  Commonly known as:  PATANOL  Place 1 drop into both eyes 2 (two) times daily as needed.     ondansetron 4 MG tablet  Commonly known as:  ZOFRAN  Take 1 tablet (4 mg total) by mouth every 8 (eight) hours as needed for nausea or vomiting.     oxyCODONE-acetaminophen 5-325 MG tablet  Commonly known as:  PERCOCET/ROXICET  Take 1-2  tablets by mouth every 3 (three) hours as needed for moderate pain.     OXYGEN  Inhale into the lungs. 2L all day and night     pantoprazole 40 MG tablet  Commonly known as:  PROTONIX  Take 1 tablet (40 mg total) by mouth 2 (two) times daily.     ranolazine 500 MG 12 hr tablet  Commonly known as:  RANEXA  Take 1 tablet (500 mg total) by mouth 2 (two) times daily.     roflumilast 500 MCG Tabs tablet  Commonly known as:  DALIRESP  Take 500 mcg by mouth daily.     senna 8.6 MG Tabs tablet  Commonly known as:  SENOKOT  Take 1 tablet (8.6 mg total) by mouth 2 (two) times daily.     sodium bicarbonate 650 MG tablet  Take 1 tablet (650 mg total) by mouth 2 (two) times daily.     Testosterone 20.25 MG/1.25GM (1.62%) Gel  Commonly known as:  ANDROGEL  Apply 3 pumps on each arm daily (6) pumps total     tiotropium 18 MCG inhalation capsule  Commonly known as:  SPIRIVA  Place 18 mcg into inhaler and inhale daily.           Follow-up Information    Follow up with [facility].   Why:  Registered Nurse   Contact  information:   [place] [address] [phone]       Follow up with Sherril Croon, MD On 08/27/2015.   Specialty:  Nephrology   Why:  @1 :15pm   Contact information:   [place] [address] [phone]       Follow up with Leeanne Rio, PA-C. Schedule an appointment as soon as possible for a visit in 1 week.   Specialty:  Family Medicine   Why:  Follow up appt after recent hospitalization   Contact information:   [address] [place] [address] [phone]        The results of significant diagnostics from this hospitalization (including imaging, microbiology, ancillary and laboratory) are listed below for reference.    Significant Diagnostic Studies: Dg Chest 2 View  08/09/2015  CLINICAL DATA:  Shortness of breath. EXAM: CHEST  2 VIEW COMPARISON:  08/08/2015. FINDINGS:  Mediastinum and hilar structures normal. Cardiomegaly again noted. Interim partial clearing of bilateral pulmonary infiltrates/edema. Small bilateral pleural effusions again noted. No pneumothorax . IMPRESSION: Congestive heart failure with interim partial clearing of pulmonary edema. Small bilateral pleural effusions. Electronically Signed   By: Marcello Moores  Register   On: 08/09/2015 08:18   Dg Chest 2 View  08/01/2015  CLINICAL DATA:  Chest pains and cough. Sore throat. History of esophageal cancer. EXAM: CHEST  2 VIEW COMPARISON:  07/26/2015 FINDINGS: Lungs are clear bilaterally. Retrocardiac density is compatible with history of esophageal cancer and previous gastric pull-through procedure. There is a nodular density in the right lower chest which was present on the exam from 09/21/2014 and may be related to a nipple shadow. Atherosclerotic calcifications in the thoracic aorta. IMPRESSION: No acute chest findings. Electronically Signed   By: Markus Daft M.D.   On: 08/01/2015 16:04   Dg Chest 2 View  07/26/2015  CLINICAL DATA:  Cough, fever, and chest congestion for the past week ; history of COPD, former smoker, history of pneumonia several months ago. EXAM: CHEST  2 VIEW COMPARISON:  PA and lateral chest x-ray of October 25, 2014 FINDINGS: The lungs remain mildly hyperinflated with hemidiaphragm flattening. The lung markings are coarse in the right lower lobe posteriorly and are more conspicuous than on the previous study. The left lung is clear. The heart and pulmonary vascularity are normal. The mediastinum is normal in width. There is no pleural effusion. There is mild multilevel degenerative disc disease of the thoracic spine. IMPRESSION: COPD. Posterior left lower lobe infiltrate. There is no pleural effusion or evidence of CHF. Followup PA and lateral chest X-ray is recommended in 3-4 weeks following trial of antibiotic therapy to ensure resolution and exclude underlying malignancy. Electronically Signed    By: David  Martinique M.D.   On: 07/26/2015 12:23   Ct Chest Wo Contrast  08/04/2015  CLINICAL DATA:  Dyspnea. EXAM: CT CHEST WITHOUT CONTRAST TECHNIQUE: Multidetector CT imaging of the chest was performed following the standard protocol without IV contrast. COMPARISON:  Chest radiograph of August 01, 2015. CT scan of November 06, 2004. FINDINGS: No pneumothorax is noted. Minimal bilateral pleural effusions are noted with adjacent subsegmental atelectasis. Atherosclerosis of thoracic aorta is noted without aneurysm. Mild pericardial effusion is noted. Aberrant right subclavian artery is noted. Visualized portion of upper abdomen is unremarkable. Aortic valve calcifications are noted. Coronary artery calcifications are noted as well. No mediastinal adenopathy is noted. The esophagus is dilated and full of debris concerning for distal obstruction. The patient  is status post esophageal surgery. IMPRESSION: Minimal bilateral posterior basilar pleural effusions are noted with adjacent subsegmental atelectasis. Atherosclerosis thoracic aorta is noted without aneurysm formation. Mild pericardial effusion is noted. Aortic valve calcifications are noted. Coronary artery calcifications are noted suggesting coronary artery disease. Dilated esophagus is noted full of debris concerning for distal obstruction. The patient is status post esophageal surgery. Endoscopy is recommended for further evaluation. Electronically Signed   By: Marijo Conception, M.D.   On: 08/04/2015 15:08   US Renal  08/03/2015  CLINICAL DATA:  80 year old male with renal failure. EXAM: RENAL / URINARY TRACT ULTRASOUND COMPLETE COMPARISON:  Noncontrast CT 03/08/2015 FINDINGS: Right Kidney: Length: 11.4 cm. Echogenicity within normal limits. Mild thinning of renal parenchyma. No mass or hydronephrosis visualized. Lower pole calculi on prior CT are not visualized sonographically. Left Kidney: Length: 9.8 cm. Echogenicity within normal limits. There is thinning of  renal parenchyma. No mass or hydronephrosis visualized. The cyst in the upper left kidney on prior CT is not visualized sonographically. Bladder: Decompressed by Foley catheter and not well evaluated. IMPRESSION: No obstructive uropathy. There is thinning of both renal parenchyma consistent with chronic medical renal disease. Electronically Signed   By: Jeb Levering M.D.   On: 08/03/2015 19:15   Nm Pulmonary Perf And Vent  08/10/2015  CLINICAL DATA:  Shortness of breath and chest pain EXAM: NUCLEAR MEDICINE VENTILATION - PERFUSION LUNG SCAN Views: Anterior, posterior, LPO, RPO, LAO, RAO -ventilation and perfusion RADIOPHARMACEUTICALS:  [id number] millicurie mCi [id number] DTPA aerosol inhalation and 4.3 millicurie mCi [id number] MAA IV COMPARISON:  Chest radiograph Aug 09, 2015 FINDINGS: Ventilation: There are scattered small nonsegmental ventilation defects bilaterally. Perfusion: There are a few nonsegmental perfusion defects which match ventilation defects. There is no appreciable ventilation/perfusion mismatch. There is no segmental level perfusion defect. Cardiomegaly is noted. IMPRESSION: No appreciable ventilation/ perfusion mismatch. No segmental sized perfusion defects. This study constitutes a low probability of pulmonary embolus. There is cardiomegaly. Electronically Signed   By: Lowella Grip III M.D.   On: 08/10/2015 15:16   Dg Chest Port 1 View  08/08/2015  CLINICAL DATA:  Shortness of breath EXAM: PORTABLE CHEST 1 VIEW COMPARISON:  08/01/2015 FINDINGS: Cardiac enlargement is identified. Increase in bilateral pleural effusions with progressive pulmonary edema. Aortic atherosclerosis noted. No airspace consolidation identified. IMPRESSION: 1. Cardiac enlargement and CHF. Electronically Signed   By: Kerby Moors M.D.   On: 08/08/2015 07:24   Dg Abd 2 Views  08/02/2015  CLINICAL DATA:  Vomiting x 1 hour. Pt denies any abdominal pain. Recent dx last week of PNA. Hx multiple abdominal  conditions and surgeries. EXAM: ABDOMEN - 2 VIEW COMPARISON:  CT, 03/08/2015 FINDINGS: Normal bowel gas pattern.  No free air. There is a vague calcification adjacent to the L3 spinous process measuring approximately 16 mm greatest dimension. This represents a small nonspecific calcified mesenteric lesion on the prior CT, likely an area remote fat necrosis. No evidence of renal or ureteral stones. IMPRESSION: 1. No acute findings.  No evidence of obstruction or free air. Electronically Signed   By: Lajean Manes M.D.   On: 08/02/2015 19:40    Microbiology: Recent Results (from the past 240 hour(s))  Urine culture     Status: Abnormal   Collection Time: 08/03/15  6:01 PM  Result Value Ref Range Status   Specimen Description URINE, CATHETERIZED  Final   Special Requests NONE  Final   Culture (A)  Final    >=100,000 COLONIES/mL DIPHTHEROIDS(CORYNEBACTERIUM SPECIES)  Standardized susceptibility testing for this organism is not available.    Report Status 08/05/2015 FINAL  Final  Urine culture     Status: Abnormal   Collection Time: 08/08/15  6:54 PM  Result Value Ref Range Status   Specimen Description URINE, RANDOM  Final   Special Requests NONE  Final   Culture >=100,000 COLONIES/mL YEAST (A)  Final   Report Status 08/10/2015 FINAL  Final  Urine culture     Status: Abnormal   Collection Time: 08/08/15  9:41 PM  Result Value Ref Range Status   Specimen Description URINE, RANDOM  Final   Special Requests NONE  Final   Culture 80,000 COLONIES/mL YEAST (A)  Final   Report Status 08/10/2015 FINAL  Final     Labs: Basic Metabolic Panel:  Recent Labs Lab 08/07/15 0835 08/08/15 0444 08/09/15 0324 08/09/15 1136 08/10/15 0430 08/12/15 0502  NA 129* 130* 132* 133* 135 138  K 5.3* 5.1 5.3* 5.3* 4.3 4.1  CL 101 100* 100* 102 103 102  CO2 16* 18* 16* 17* 18* 22  GLUCOSE 149* 128* 123* 159* 169* 137*  BUN 65* 64* 73* 77* 71* 69*  CREATININE 3.44* 3.42* 3.47* 3.59* 3.25* 3.03*  CALCIUM  8.0* 8.2* 8.2* 8.4* 8.2* 8.4*  PHOS 4.5  --   --  4.6  --  3.9   Liver Function Tests:  Recent Labs Lab 08/07/15 0835 08/09/15 1136 08/12/15 0502  ALBUMIN 2.1* 2.3* 1.9*   No results for input(s): LIPASE, AMYLASE in the last 168 hours. No results for input(s): AMMONIA in the last 168 hours. CBC:  Recent Labs Lab 08/08/15 0444 08/09/15 0324 08/10/15 0430 08/11/15 0454 08/12/15 0502  WBC 19.9* 19.9* 17.1* 16.8* 14.0*  NEUTROABS  --   --   --   --  12.1*  HGB 7.9* 7.6* 7.2* 9.7* 9.7*  HCT 25.2* 23.7* 22.6* 29.5* 30.2*  MCV 88.7 88.8 88.3 87.0 86.8  PLT 458* 464* 435* 369 328   Cardiac Enzymes:  Recent Labs Lab 08/08/15 0444 08/09/15 0324  CKTOTAL 66 67   BNP: BNP (last 3 results)  Recent Labs  08/01/15 1620 08/09/15 0324  BNP 483.1* 919.9*    ProBNP (last 3 results)  Recent Labs  10/30/14 1056  PROBNP 117.0*    CBG:  Recent Labs Lab 08/12/15 0745 08/12/15 1121 08/12/15 1636 08/12/15 2111 08/13/15 0733  GLUCAP 130* 140* 175* 124* 140*

## 2015-08-13 NOTE — Progress Notes (Signed)
PT Cancellation Note  Patient Details Name: Ricky Baker MRN: [id number] DOB: [birth year]   Cancelled Treatment:    Reason Eval/Treat Not Completed: Fatigue/lethargy limiting ability to participate;Other (comment) (and nausea). Pt declined therapy today and plan is to go to [place] later in the day.    [place], [place] 08/13/2015, 12:19 PM

## 2015-08-13 NOTE — Progress Notes (Signed)
Background:  Pt is a 80 y.o. yo male who was admitted on 08/01/2015 with acute on chronic kidney disease & CHF  Assessment/Plan: 1. AoCKD in setting of CHF exacerbation- Cr 2.88 at admission. 3.03<<3.25, improving UOP with PO lasix. Home Lasix does 40-80 mg daily Plan: Continue PO 80 mg furosemide daily  Subjective: No acute concerns Nauseous - has had for 2 years or more - tired and no appetite; breathing is fine  Objective: Vital signs in last 24 hours: Temp:  [97.6 F (36.4 C)-98.3 F (36.8 C)] 98.3 F (36.8 C) (05/08 0844) Pulse Rate:  [77-80] 80 (05/08 0400) Resp:  [12-18] 14 (05/08 0400) BP: (108-119)/(51-63) 108/60 mmHg (05/08 0400) SpO2:  [94 %-98 %] 95 % (05/08 0400) Weight:  [94.257 kg (207 lb 12.8 oz)] 94.257 kg (207 lb 12.8 oz) (05/08 0331) Weight change: -0.045 kg (-1.6 oz)  Intake/Output from previous day: 05/07 0701 - 05/08 0700 In: 360 [P.O.:360] Out: 1125 [Urine:1125] Intake/Output this shift: Total I/O In: 120 [P.O.:120] Out: -   General appearance: alert, cooperative and pale Complains of nausea - says has had for 2 years.  Lungs clear to auscultation CV RRR Normal S1S2 No S3 2/6 murmur LSB Extremities:no LE edema of LE's  Lab Results:   Recent Labs  08/12/15 0502  NA 138  K 4.1  CL 102  CO2 22  GLUCOSE 137*  BUN 69*  CREATININE 3.03*  CALCIUM 8.4*  PHOS 3.9     Recent Labs  08/12/15 0502  ALBUMIN 1.9*     Recent Labs  08/11/15 0454 08/12/15 0502  WBC 16.8* 14.0*  NEUTROABS  --  12.1*  HGB 9.7* 9.7*  HCT 29.5* 30.2*  MCV 87.0 86.8  PLT 369 328   Scheduled: . amiodarone  200 mg Oral Daily  . antiseptic oral rinse  7 mL Mouth Rinse q12n4p  . atorvastatin  80 mg Oral Daily  . buPROPion  300 mg Oral Daily  . chlorhexidine  15 mL Mouth Rinse BID  . doxercalciferol  2.5 mcg Oral Daily  . DULoxetine  60 mg Oral Daily  . feeding supplement (ENSURE ENLIVE)  237 mL Oral TID BM  . fluticasone  2 spray Each Nare Daily  .  furosemide  80 mg Oral Daily  . gabapentin  600 mg Oral TID  . heparin subcutaneous  5,000 Units Subcutaneous Q8H  . insulin aspart  0-15 Units Subcutaneous TID WC  . levothyroxine  112 mcg Oral QAC breakfast  . montelukast  10 mg Oral QHS  . nebivolol  2.5 mg Oral Daily  . pantoprazole  40 mg Oral BID  . ranolazine  500 mg Oral BID  . roflumilast  500 mcg Oral Daily  . senna  1 tablet Oral BID  . sodium bicarbonate  650 mg Oral BID  . sodium chloride  500 mL Intravenous Once  . testosterone  5 g Transdermal Daily  . [START ON 08/24/2015] Vitamin D (Ergocalciferol)  50,000 Units Oral Q30 days    LOS: 12 days   Haney,Alyssa 08/13/2015,8:46 AM   Pt seen and examined. Agree with above note with highlighted additions. He is clinically stable on his home dose of lasix 80 mg once a day, and renal function has improved daily for the last 3 days. Creatinine today is down to 3.03 (baseline creatinine is 2.7-3.1) , K and bicarb stable.  Family says he is to be discharged to SNF. OK from renal standpoint. Renal will sign off at this time.  Jamal Maes, MD [facility] [phone] Pager 08/13/2015, 12:45 PM

## 2015-08-13 NOTE — Discharge Instructions (Signed)
Apixaban oral tablets °What is this medicine? °APIXABAN (a PIX a ban) is an anticoagulant (blood thinner). It is used to lower the chance of stroke in people with a medical condition called atrial fibrillation. It is also used to treat or prevent blood clots in the lungs or in the veins. °This medicine may be used for other purposes; ask your health care provider or pharmacist if you have questions. °What should I tell my health care provider before I take this medicine? °They need to know if you have any of these conditions: °-bleeding disorders °-bleeding in the brain °-blood in your stools (black or tarry stools) or if you have blood in your vomit °-history of stomach bleeding °-kidney disease °-liver disease °-mechanical heart valve °-an unusual or allergic reaction to apixaban, other medicines, foods, dyes, or preservatives °-pregnant or trying to get pregnant °-breast-feeding °How should I use this medicine? °Take this medicine by mouth with a glass of water. Follow the directions on the prescription label. You can take it with or without food. If it upsets your stomach, take it with food. Take your medicine at regular intervals. Do not take it more often than directed. Do not stop taking except on your doctor's advice. Stopping this medicine may increase your risk of a blot clot. Be sure to refill your prescription before you run out of medicine. °Talk to your pediatrician regarding the use of this medicine in children. Special care may be needed. °Overdosage: If you think you have taken too much of this medicine contact a poison control center or emergency room at once. °NOTE: This medicine is only for you. Do not share this medicine with others. °What if I miss a dose? °If you miss a dose, take it as soon as you can. If it is almost time for your next dose, take only that dose. Do not take double or extra doses. °What may interact with this medicine? °This medicine may interact with the following: °-aspirin  and aspirin-like medicines °-certain medicines for fungal infections like ketoconazole and itraconazole °-certain medicines for seizures like carbamazepine and phenytoin °-certain medicines that treat or prevent blood clots like warfarin, enoxaparin, and dalteparin °-clarithromycin °-NSAIDs, medicines for pain and inflammation, like ibuprofen or naproxen °-rifampin °-ritonavir °-St. John's wort °This list may not describe all possible interactions. Give your health care provider a list of all the medicines, herbs, non-prescription drugs, or dietary supplements you use. Also tell them if you smoke, drink alcohol, or use illegal drugs. Some items may interact with your medicine. °What should I watch for while using this medicine? °Notify your doctor or health care professional and seek emergency treatment if you develop breathing problems; changes in vision; chest pain; severe, sudden headache; pain, swelling, warmth in the leg; trouble speaking; sudden numbness or weakness of the face, arm, or leg. These can be signs that your condition has gotten worse. °If you are going to have surgery, tell your doctor or health care professional that you are taking this medicine. °Tell your health care professional that you use this medicine before you have a spinal or epidural procedure. Sometimes people who take this medicine have bleeding problems around the spine when they have a spinal or epidural procedure. This bleeding is very rare. If you have a spinal or epidural procedure while on this medicine, call your health care professional immediately if you have back pain, numbness or tingling (especially in your legs and feet), muscle weakness, paralysis, or loss of bladder or bowel   control. Avoid sports and activities that might cause injury while you are using this medicine. Severe falls or injuries can cause unseen bleeding. Be careful when using sharp tools or knives. Consider using an Copy. Take special care  brushing or flossing your teeth. Report any injuries, bruising, or red spots on the skin to your doctor or health care professional. What side effects may I notice from receiving this medicine? Side effects that you should report to your doctor or health care professional as soon as possible: -allergic reactions like skin rash, itching or hives, swelling of the face, lips, or tongue -signs and symptoms of bleeding such as bloody or black, tarry stools; red or dark-brown urine; spitting up blood or brown material that looks like coffee grounds; red spots on the skin; unusual bruising or bleeding from the eye, gums, or nose This list may not describe all possible side effects. Call your doctor for medical advice about side effects. You may report side effects to FDA at 1-800-FDA-1088. Where should I keep my medicine? Keep out of the reach of children. Store at room temperature between 20 and 25 degrees C (68 and 77 degrees F). Throw away any unused medicine after the expiration date. NOTE: This sheet is a summary. It may not cover all possible information. If you have questions about this medicine, talk to your doctor, pharmacist, or health care provider.    2016, [employer]. (2012-11-26 11:59:24) Furosemide tablets What is this medicine? FUROSEMIDE (fyoor [place] se mide) is a diuretic. It helps you make more urine and to lose salt and excess water from your body. This medicine is used to treat high blood pressure, and edema or swelling from heart, kidney, or liver disease. This medicine may be used for other purposes; ask your health care provider or pharmacist if you have questions. What should I tell my health care provider before I take this medicine? They need to know if you have any of these conditions: -abnormal blood electrolytes -diarrhea or vomiting -gout -heart disease -kidney disease, small amounts of urine, or difficulty passing urine -liver disease -thyroid disease -an  unusual or allergic reaction to furosemide, sulfa drugs, other medicines, foods, dyes, or preservatives -pregnant or trying to get pregnant -breast-feeding How should I use this medicine? Take this medicine by mouth with a glass of water. Follow the directions on the prescription label. You may take this medicine with or without food. If it upsets your stomach, take it with food or milk. Do not take your medicine more often than directed. Remember that you will need to pass more urine after taking this medicine. Do not take your medicine at a time of day that will cause you problems. Do not take at bedtime. Talk to your pediatrician regarding the use of this medicine in children. While this drug may be prescribed for selected conditions, precautions do apply. Overdosage: If you think you have taken too much of this medicine contact a poison control center or emergency room at once. NOTE: This medicine is only for you. Do not share this medicine with others. What if I miss a dose? If you miss a dose, take it as soon as you can. If it is almost time for your next dose, take only that dose. Do not take double or extra doses. What may interact with this medicine? -aspirin and aspirin-like medicines -certain antibiotics -chloral hydrate -cisplatin -cyclosporine -digoxin -diuretics -laxatives -lithium -medicines for blood pressure -medicines that relax muscles for surgery -methotrexate -NSAIDs,  medicines for pain and inflammation like ibuprofen, naproxen, or indomethacin -phenytoin -steroid medicines like prednisone or cortisone -sucralfate -thyroid hormones This list may not describe all possible interactions. Give your health care provider a list of all the medicines, herbs, non-prescription drugs, or dietary supplements you use. Also tell them if you smoke, drink alcohol, or use illegal drugs. Some items may interact with your medicine. What should I watch for while using this  medicine? Visit your doctor or health care professional for regular checks on your progress. Check your blood pressure regularly. Ask your doctor or health care professional what your blood pressure should be, and when you should contact him or her. If you are a diabetic, check your blood sugar as directed. You may need to be on a special diet while taking this medicine. Check with your doctor. Also, ask how many glasses of fluid you need to drink a day. You must not get dehydrated. You may get drowsy or dizzy. Do not drive, use machinery, or do anything that needs mental alertness until you know how this drug affects you. Do not stand or sit up quickly, especially if you are an older patient. This reduces the risk of dizzy or fainting spells. Alcohol can make you more drowsy and dizzy. Avoid alcoholic drinks. This medicine can make you more sensitive to the sun. Keep out of the sun. If you cannot avoid being in the sun, wear protective clothing and use sunscreen. Do not use sun lamps or tanning beds/booths. What side effects may I notice from receiving this medicine? Side effects that you should report to your doctor or health care professional as soon as possible: -blood in urine or stools -dry mouth -fever or chills -hearing loss or ringing in the ears -irregular heartbeat -muscle pain or weakness, cramps -skin rash -stomach upset, pain, or nausea -tingling or numbness in the hands or feet -unusually weak or tired -vomiting or diarrhea -yellowing of the eyes or skin Side effects that usually do not require medical attention (report to your doctor or health care professional if they continue or are bothersome): -headache -loss of appetite -unusual bleeding or bruising This list may not describe all possible side effects. Call your doctor for medical advice about side effects. You may report side effects to FDA at 1-800-FDA-1088. Where should I keep my medicine? Keep out of the reach of  children. Store at room temperature between 15 and 30 degrees C (59 and 86 degrees F). Protect from light. Throw away any unused medicine after the expiration date. NOTE: This sheet is a summary. It may not cover all possible information. If you have questions about this medicine, talk to your doctor, pharmacist, or health care provider.    2016, [employer]. (2014-06-14 13:49:50)

## 2015-08-14 ENCOUNTER — Encounter: Payer: Self-pay | Admitting: Internal Medicine

## 2015-08-14 ENCOUNTER — Encounter: Payer: Self-pay | Admitting: Adult Health

## 2015-08-14 ENCOUNTER — Other Ambulatory Visit: Payer: Self-pay | Admitting: *Deleted

## 2015-08-14 ENCOUNTER — Non-Acute Institutional Stay (SKILLED_NURSING_FACILITY): Payer: Medicare Other | Admitting: Adult Health

## 2015-08-14 DIAGNOSIS — E1121 Type 2 diabetes mellitus with diabetic nephropathy: Secondary | ICD-10-CM | POA: Diagnosis not present

## 2015-08-14 DIAGNOSIS — R079 Chest pain, unspecified: Secondary | ICD-10-CM

## 2015-08-14 DIAGNOSIS — J449 Chronic obstructive pulmonary disease, unspecified: Secondary | ICD-10-CM | POA: Diagnosis not present

## 2015-08-14 DIAGNOSIS — E1165 Type 2 diabetes mellitus with hyperglycemia: Secondary | ICD-10-CM

## 2015-08-14 DIAGNOSIS — K279 Peptic ulcer, site unspecified, unspecified as acute or chronic, without hemorrhage or perforation: Secondary | ICD-10-CM

## 2015-08-14 DIAGNOSIS — K5901 Slow transit constipation: Secondary | ICD-10-CM

## 2015-08-14 DIAGNOSIS — E785 Hyperlipidemia, unspecified: Secondary | ICD-10-CM

## 2015-08-14 DIAGNOSIS — R531 Weakness: Secondary | ICD-10-CM

## 2015-08-14 DIAGNOSIS — I5033 Acute on chronic diastolic (congestive) heart failure: Secondary | ICD-10-CM

## 2015-08-14 DIAGNOSIS — J9611 Chronic respiratory failure with hypoxia: Secondary | ICD-10-CM | POA: Diagnosis not present

## 2015-08-14 DIAGNOSIS — IMO0002 Reserved for concepts with insufficient information to code with codable children: Secondary | ICD-10-CM

## 2015-08-14 DIAGNOSIS — I480 Paroxysmal atrial fibrillation: Secondary | ICD-10-CM

## 2015-08-14 DIAGNOSIS — E039 Hypothyroidism, unspecified: Secondary | ICD-10-CM | POA: Diagnosis not present

## 2015-08-14 DIAGNOSIS — R630 Anorexia: Secondary | ICD-10-CM

## 2015-08-14 DIAGNOSIS — E43 Unspecified severe protein-calorie malnutrition: Secondary | ICD-10-CM

## 2015-08-14 DIAGNOSIS — F331 Major depressive disorder, recurrent, moderate: Secondary | ICD-10-CM

## 2015-08-14 DIAGNOSIS — J309 Allergic rhinitis, unspecified: Secondary | ICD-10-CM

## 2015-08-14 DIAGNOSIS — D638 Anemia in other chronic diseases classified elsewhere: Secondary | ICD-10-CM

## 2015-08-14 DIAGNOSIS — Z794 Long term (current) use of insulin: Secondary | ICD-10-CM

## 2015-08-14 NOTE — Progress Notes (Signed)
Patient ID: Ricky Baker, male   DOB: 05/13/1935, 80 y.o.   MRN: [id number]    DATE:  08/14/2015   MRN:  [id number]  BIRTHDAY: 11-02-1935  Facility:  Nursing Home Location:  [place] Room Number: 1206-P  LEVEL OF CARE:  SNF (31)  Contact Information    Name Relation Home Work [place] Sister [phone]  [phone]       Code Status History    Date Active Date Inactive Code Status Order ID Comments User Context   08/12/2015  1:07 PM 08/13/2015  8:53 PM DNR [id number]  Elwin Mocha, MD Inpatient   08/01/2015  9:44 PM 08/12/2015  1:07 PM Full Code [id number]  Vianne Bulls, MD Inpatient    Questions for Most Recent Historical Code Status (Order [id number])    Question Answer Comment   In the event of cardiac or respiratory ARREST Do not call a "code blue"    In the event of cardiac or respiratory ARREST Do not perform Intubation, CPR, defibrillation or ACLS    In the event of cardiac or respiratory ARREST Use medication by any route, position, wound care, and other measures to relive pain and suffering. May use oxygen, suction and manual treatment of airway obstruction as needed for comfort.     Advance Directive Documentation        Most Recent Value   Type of Advance Directive  Out of facility DNR (pink MOST or yellow form)   Pre-existing out of facility DNR order (yellow form or pink MOST form)     "MOST" Form in Place?         Chief Complaint  Patient presents with  . Hospitalization Follow-up    HISTORY OF PRESENT ILLNESS:  This is a 80 year old male who has been admitted to [facility] on 08/13/15 from [facility]. He has PMH BPH with urinary retention, PAF on Eliquis, chronic diastolic CHF, COPD with home O2 requirement, CKD stage III, hypothyroidism, depression and esophageal cancer S/P resection in 2006. He was recently treated by his PCP for PNA with a course of Augmentin and Prednisone. Fever and dyspnea  were resolved but continued to have chest pain. He went to [facility]. CXR showed resolution of prior infiltrate, wbc 21.4 and hgb 10.2 . Troponin was 0.06 and BNP 483, elevated. NA 128 and glucose 504. He was given 12 units SQ Novolog and was bolused with NS 1.5L  . He was then transferred to [facility], slightly elevated troponin had a flat trend and pleuritic chest pain was thought to be musculoskeletal secondary to pneumonia. EKG showed no ischemia.He had progressive decline and more confusion while in the hospital. Gabapentin was held and had better mental status.  Latest creatinine is 3.03 with baseline 2.8. He has CKD stage III and palliative consulted in hospital. He is not interested in HD according to Palliative notes. He has received 2 units of PRBC while in the hospital. Latest hgb 9.7. He has a history of PUD and gastritis evident on EGD on 2/17. Given his risk of recurrent bleed and CKD stage IV, his Eliquis was lowered to 2.5 mg BID.  Patient is noted to have sad affect. Sister was at bedside and verbalized that he sees a psychiatrist and will check when is his next appointment. He was, also, noted to have unstageable sacral pressure ulcer. Sister was asking for Palliative consultation  due to patient's progressive decline.  He has been admitted for a short-term rehabilitation.   PAST MEDICAL HISTORY:  Past Medical History  Diagnosis Date  . COPD (chronic obstructive pulmonary disease) ([place])   . CHF (congestive heart failure) ([place])   . Thyroid disease   . Chronic bronchitis ([place])   . Atrial fibrillation ([place])   . CKD (chronic kidney disease)   . Diabetes mellitus without complication ([place])   . Allergy   . Esophageal cancer ([facility])     esophogeal   . CKD (chronic kidney disease)   . Depression   . Hypertension   . Colon polyps   . CAD (coronary artery disease)   . Hyperlipidemia   . GERD (gastroesophageal reflux disease)   .  History of renal stone   . Stroke [facility])     ? TIA per pt  . Shortness of breath dyspnea     with exertion  . Pneumonia   . Dyslexia   . Enlarged prostate   . Headache     hx of - none in years  . Anemia   . Loose stools   . Flatulence/gas pain/belching   . Pleuritic chest pain   . Acquired autoimmune hypothyroidism   . Leukocytosis      CURRENT MEDICATIONS: Reviewed  Patient's Medications  New Prescriptions   No medications on file  Previous Medications   ACCU-CHEK AVIVA PLUS TEST STRIP    TEST AS DIRECTED THREE TIMES DAILY   ACETAMINOPHEN (TYLENOL) 325 MG TABLET    Take 2 tablets (650 mg total) by mouth every 4 (four) hours as needed for headache or mild pain.   AMIODARONE (PACERONE) 200 MG TABLET    Take 1 tablet (200 mg total) by mouth daily.   APIXABAN (ELIQUIS) 2.5 MG TABS TABLET    Take 1 tablet (2.5 mg total) by mouth 2 (two) times daily.   ATORVASTATIN (LIPITOR) 80 MG TABLET    Take 1 tablet (80 mg total) by mouth daily.   AZELASTINE (ASTELIN) 0.1 % NASAL SPRAY    Place 2 sprays into both nostrils at bedtime as needed for rhinitis. Use in each nostril as directed   BUPROPION (WELLBUTRIN XL) 300 MG 24 HR TABLET    TAKE 1 TABLET(300 MG) BY MOUTH DAILY   CHOLECALCIFEROL (VITAMIN D3) 50000 UNITS CAPS    Take 50,000 capsules by mouth every 30 (thirty) days.   DOXERCALCIFEROL (HECTOROL) 2.5 MCG CAPSULE    TABLET 1 CAPSULE BY MOUTH DAILY   DULOXETINE (CYMBALTA) 60 MG CAPSULE    Take 1 capsule (60 mg total) by mouth daily.   FLUTICASONE (FLONASE) 50 MCG/ACT NASAL SPRAY    Place 2 sprays into both nostrils daily.   FLUTICASONE FUROATE-VILANTEROL (BREO ELLIPTA IN)    Inhale 1 puff into the lungs daily.    FUROSEMIDE (LASIX) 80 MG TABLET    Take 1 tablet (80 mg total) by mouth daily.   INSULIN ASPART (NOVOLOG) 100 UNIT/ML INJECTION    Inject 0-15 Units into the skin 3 (three) times daily before meals. 0-60 institute hypoglycemic protocol, 70-120 = 0 units, 121-150 = 2 units, 151-200  = 3 units, 201-250 = 5 units, 251-300 = 8 units, 301-350 = 11 units, 251-400 = 15 units, > 400 notify MD/NP   INSULIN SYRINGE-NEEDLE U-100 (LITETOUCH INSULIN SYRINGE) 31G X 5/16" 0.3 ML MISC    Use as directed   IPRATROPIUM-ALBUTEROL (DUONEB) 0.5-2.5 (3) MG/3ML SOLN    Take 3 mLs by nebulization every  6 (six) hours as needed.   KETOCONAZOLE (NIZORAL) 2 % SHAMPOO    Apply 1 application topically 2 (two) times a week.   LEVOTHYROXINE (SYNTHROID, LEVOTHROID) 112 MCG TABLET    Take 112 mcg by mouth daily before breakfast.   MONTELUKAST (SINGULAIR) 10 MG TABLET    Take 10 mg by mouth at bedtime.   NEBIVOLOL (BYSTOLIC) 2.5 MG TABLET    Take 1 tablet (2.5 mg total) by mouth daily.   OLOPATADINE (PATANOL) 0.1 % OPHTHALMIC SOLUTION    Place 1 drop into both eyes 2 (two) times daily as needed.   ONDANSETRON (ZOFRAN) 4 MG TABLET    Take 1 tablet (4 mg total) by mouth every 8 (eight) hours as needed for nausea or vomiting.   OXYCODONE-ACETAMINOPHEN (PERCOCET/ROXICET) 5-325 MG TABLET    Take 1-2 tablets by mouth every 3 (three) hours as needed for moderate pain.   OXYGEN    Inhale 2 mLs into the lungs continuous.    PANTOPRAZOLE (PROTONIX) 40 MG TABLET    Take 1 tablet (40 mg total) by mouth 2 (two) times daily.   RANOLAZINE (RANEXA) 500 MG 12 HR TABLET    Take 1 tablet (500 mg total) by mouth 2 (two) times daily.   ROFLUMILAST (DALIRESP) 500 MCG TABS TABLET    Take 500 mcg by mouth daily.   SENNA (SENOKOT) 8.6 MG TABS TABLET    Take 1 tablet (8.6 mg total) by mouth 2 (two) times daily.   SODIUM BICARBONATE 650 MG TABLET    Take 1 tablet (650 mg total) by mouth 2 (two) times daily.   TESTOSTERONE (ANDROGEL) 20.25 MG/1.25GM (1.62%) GEL    Apply 3 pumps on each arm daily (6) pumps total   TIOTROPIUM (SPIRIVA) 18 MCG INHALATION CAPSULE    Place 18 mcg into inhaler and inhale daily.   UNABLE TO FIND    Med Name: MedPass 120 mL TID between meals for nutritional support  Modified Medications   No medications on  file  Discontinued Medications   ERGOCALCIFEROL (VITAMIN D2) 50000 UNITS CAPSULE    Take 1 capsule (50,000 Units total) by mouth every 30 (thirty) days.   FEEDING SUPPLEMENT, ENSURE ENLIVE, (ENSURE ENLIVE) LIQD    Take 237 mLs by mouth 3 (three) times daily between meals.   INSULIN ASPART (NOVOLOG) 100 UNIT/ML INJECTION    Inject 0-15 Units into the skin 3 (three) times daily with meals.     Allergies  Allergen Reactions  . Dust Mite Extract Other (See Comments)    Runny nose   . Pollen Extract Other (See Comments)    Runny nose    REVIEW OF SYSTEMS:  GENERAL:  no fever, chills ; +poor appetite EYES: Denies change in vision, dry eyes, eye pain, itching or discharge EARS: Denies change in hearing, ringing in ears, or earache NOSE: Denies nasal congestion or epistaxis MOUTH and THROAT: Denies oral discomfort, gingival pain or bleeding, pain from teeth or hoarseness   RESPIRATORY: no cough, SOB, DOE, wheezing, hemoptysis CARDIAC: no chest pain, edema or palpitations GI: no abdominal pain, diarrhea, constipation, heart burn, nausea or vomiting GU: Denies dysuria, frequency, hematuria, incontinence, or discharge PSYCHIATRIC: Denies feeling of anxiety. No report of hallucinations, insomnia, paranoia, or agitation, + depression   PHYSICAL EXAMINATION  GENERAL APPEARANCE: Well nourished. In no acute distress. Normal body habitus SKIN:  Noted to have unstageable sacral pressure ulcer HEAD: Normal in size and contour. No evidence of trauma EYES: Lids open and close normally.  No blepharitis, entropion or ectropion. PERRL. Conjunctivae are clear and sclerae are white. Lenses are without opacity EARS: Pinnae are normal. Patient hears normal voice tunes of the examiner MOUTH and THROAT: Lips are without lesions. Oral mucosa is moist and without lesions. Tongue is normal in shape, size, and color and without lesions NECK: supple, trachea midline, no neck masses, no thyroid tenderness, no  thyromegaly LYMPHATICS: no LAN in the neck, no supraclavicular LAN RESPIRATORY: breathing is even & unlabored, BS CTAB, O2 @ 2L/min via [place] CARDIAC: RRR, no murmur,no extra heart sounds, no edema GI: abdomen soft, normal BS, no masses, no tenderness, no hepatomegaly, no splenomegaly GU:  Has FC EXTREMITIES:  Able to move X 4 extremities PSYCHIATRIC: Alert and oriented X 3. Affect and behavior are appropriate  LABS/RADIOLOGY: Labs reviewed: Basic Metabolic Panel:  Recent Labs  08/05/15 1656  08/07/15 0835  08/09/15 1136 08/10/15 0430 08/12/15 0502  NA  --   < > 129*  < > 133* 135 138  K  --   < > 5.3*  < > 5.3* 4.3 4.1  CL  --   < > 101  < > 102 103 102  CO2  --   < > 16*  < > 17* 18* 22  GLUCOSE  --   < > 149*  < > 159* 169* 137*  BUN  --   < > 65*  < > 77* 71* 69*  CREATININE  --   < > 3.44*  < > 3.59* 3.25* 3.03*  CALCIUM  --   < > 8.0*  < > 8.4* 8.2* 8.4*  MG 1.9  --   --   --   --   --   --   PHOS  --   < > 4.5  --  4.6  --  3.9  < > = values in this interval not displayed. Liver Function Tests:  Recent Labs  02/21/15 1004 06/20/15 1233 08/01/15 1620  08/07/15 0835 08/09/15 1136 08/12/15 0502  AST 11 15 12*  --   --   --   --   ALT 8* 14 9*  --   --   --   --   ALKPHOS 141* 105 109  --   --   --   --   BILITOT 0.5 0.5 0.8  --   --   --   --   PROT 5.6* 6.3 6.1*  --   --   --   --   ALBUMIN 3.6 3.3* 2.7*  < > 2.1* 2.3* 1.9*  < > = values in this interval not displayed.   CBC:  Recent Labs  07/26/15 1005  08/02/15 0331  08/10/15 0430 08/11/15 0454 08/12/15 0502  WBC 12.7*  < > 17.4*  < > 17.1* 16.8* 14.0*  NEUTROABS 10.9*  --  14.9*  --   --   --  12.1*  HGB 9.9*  < > 9.1*  < > 7.2* 9.7* 9.7*  HCT 29.9*  < > 28.9*  < > 22.6* 29.5* 30.2*  MCV 89.7  < > 89.8  < > 88.3 87.0 86.8  PLT 300.0  < > 195  < > 435* 369 328  < > = values in this interval not displayed.  This SmartLink has not been configured with any valid records.   Lab Results  Component  Value Date   HGBA1C 9.3* 08/01/2015   Lab Results  Component Value Date   TSH 1.24 06/04/2015  Lipid Panel:  Recent Labs  12/04/14 1353 02/21/15 1004 06/04/15 1326  HDL 34.90* 31* 34.10*   Cardiac Enzymes:  Recent Labs  08/01/15 2154 08/02/15 0331 08/02/15 1052 08/05/15 1656 08/08/15 0444 08/09/15 0324  CKTOTAL  --   --   --  449* 66 67  TROPONINI 0.07* 0.06* 0.09*  --   --   --    CBG:  Recent Labs  08/13/15 0733 08/13/15 1133 08/13/15 1642  GLUCAP 140* 154* 128*      Dg Chest 2 View  08/09/2015  CLINICAL DATA:  Shortness of breath. EXAM: CHEST  2 VIEW COMPARISON:  08/08/2015. FINDINGS: Mediastinum and hilar structures normal. Cardiomegaly again noted. Interim partial clearing of bilateral pulmonary infiltrates/edema. Small bilateral pleural effusions again noted. No pneumothorax . IMPRESSION: Congestive heart failure with interim partial clearing of pulmonary edema. Small bilateral pleural effusions. Electronically Signed   By: Marcello Moores  Register   On: 08/09/2015 08:18   Dg Chest 2 View  08/01/2015  CLINICAL DATA:  Chest pains and cough. Sore throat. History of esophageal cancer. EXAM: CHEST  2 VIEW COMPARISON:  07/26/2015 FINDINGS: Lungs are clear bilaterally. Retrocardiac density is compatible with history of esophageal cancer and previous gastric pull-through procedure. There is a nodular density in the right lower chest which was present on the exam from 09/21/2014 and may be related to a nipple shadow. Atherosclerotic calcifications in the thoracic aorta. IMPRESSION: No acute chest findings. Electronically Signed   By: Markus Daft M.D.   On: 08/01/2015 16:04   Dg Chest 2 View  07/26/2015  CLINICAL DATA:  Cough, fever, and chest congestion for the past week ; history of COPD, former smoker, history of pneumonia several months ago. EXAM: CHEST  2 VIEW COMPARISON:  PA and lateral chest x-ray of October 25, 2014 FINDINGS: The lungs remain mildly hyperinflated with  hemidiaphragm flattening. The lung markings are coarse in the right lower lobe posteriorly and are more conspicuous than on the previous study. The left lung is clear. The heart and pulmonary vascularity are normal. The mediastinum is normal in width. There is no pleural effusion. There is mild multilevel degenerative disc disease of the thoracic spine. IMPRESSION: COPD. Posterior left lower lobe infiltrate. There is no pleural effusion or evidence of CHF. Followup PA and lateral chest X-ray is recommended in 3-4 weeks following trial of antibiotic therapy to ensure resolution and exclude underlying malignancy. Electronically Signed   By: David  Martinique M.D.   On: 07/26/2015 12:23   Ct Chest Wo Contrast  08/04/2015  CLINICAL DATA:  Dyspnea. EXAM: CT CHEST WITHOUT CONTRAST TECHNIQUE: Multidetector CT imaging of the chest was performed following the standard protocol without IV contrast. COMPARISON:  Chest radiograph of August 01, 2015. CT scan of November 06, 2004. FINDINGS: No pneumothorax is noted. Minimal bilateral pleural effusions are noted with adjacent subsegmental atelectasis. Atherosclerosis of thoracic aorta is noted without aneurysm. Mild pericardial effusion is noted. Aberrant right subclavian artery is noted. Visualized portion of upper abdomen is unremarkable. Aortic valve calcifications are noted. Coronary artery calcifications are noted as well. No mediastinal adenopathy is noted. The esophagus is dilated and full of debris concerning for distal obstruction. The patient is status post esophageal surgery. IMPRESSION: Minimal bilateral posterior basilar pleural effusions are noted with adjacent subsegmental atelectasis. Atherosclerosis thoracic aorta is noted without aneurysm formation. Mild pericardial effusion is noted. Aortic valve calcifications are noted. Coronary artery calcifications are noted suggesting coronary artery disease. Dilated esophagus is noted full of debris  concerning for distal  obstruction. The patient is status post esophageal surgery. Endoscopy is recommended for further evaluation. Electronically Signed   By: Marijo Conception, M.D.   On: 08/04/2015 15:08   US Renal  08/03/2015  CLINICAL DATA:  79 year old male with renal failure. EXAM: RENAL / URINARY TRACT ULTRASOUND COMPLETE COMPARISON:  Noncontrast CT 03/08/2015 FINDINGS: Right Kidney: Length: 11.4 cm. Echogenicity within normal limits. Mild thinning of renal parenchyma. No mass or hydronephrosis visualized. Lower pole calculi on prior CT are not visualized sonographically. Left Kidney: Length: 9.8 cm. Echogenicity within normal limits. There is thinning of renal parenchyma. No mass or hydronephrosis visualized. The cyst in the upper left kidney on prior CT is not visualized sonographically. Bladder: Decompressed by Foley catheter and not well evaluated. IMPRESSION: No obstructive uropathy. There is thinning of both renal parenchyma consistent with chronic medical renal disease. Electronically Signed   By: Jeb Levering M.D.   On: 08/03/2015 19:15   Nm Pulmonary Perf And Vent  08/10/2015  CLINICAL DATA:  Shortness of breath and chest pain EXAM: NUCLEAR MEDICINE VENTILATION - PERFUSION LUNG SCAN Views: Anterior, posterior, LPO, RPO, LAO, RAO -ventilation and perfusion RADIOPHARMACEUTICALS:  [id number] millicurie mCi [id number] DTPA aerosol inhalation and 4.3 millicurie mCi [id number] MAA IV COMPARISON:  Chest radiograph Aug 09, 2015 FINDINGS: Ventilation: There are scattered small nonsegmental ventilation defects bilaterally. Perfusion: There are a few nonsegmental perfusion defects which match ventilation defects. There is no appreciable ventilation/perfusion mismatch. There is no segmental level perfusion defect. Cardiomegaly is noted. IMPRESSION: No appreciable ventilation/ perfusion mismatch. No segmental sized perfusion defects. This study constitutes a low probability of pulmonary embolus. There is cardiomegaly.  Electronically Signed   By: Lowella Grip III M.D.   On: 08/10/2015 15:16   Dg Chest Port 1 View  08/08/2015  CLINICAL DATA:  Shortness of breath EXAM: PORTABLE CHEST 1 VIEW COMPARISON:  08/01/2015 FINDINGS: Cardiac enlargement is identified. Increase in bilateral pleural effusions with progressive pulmonary edema. Aortic atherosclerosis noted. No airspace consolidation identified. IMPRESSION: 1. Cardiac enlargement and CHF. Electronically Signed   By: Kerby Moors M.D.   On: 08/08/2015 07:24   Dg Abd 2 Views  08/02/2015  CLINICAL DATA:  Vomiting x 1 hour. Pt denies any abdominal pain. Recent dx last week of PNA. Hx multiple abdominal conditions and surgeries. EXAM: ABDOMEN - 2 VIEW COMPARISON:  CT, 03/08/2015 FINDINGS: Normal bowel gas pattern.  No free air. There is a vague calcification adjacent to the L3 spinous process measuring approximately 16 mm greatest dimension. This represents a small nonspecific calcified mesenteric lesion on the prior CT, likely an area remote fat necrosis. No evidence of renal or ureteral stones. IMPRESSION: 1. No acute findings.  No evidence of obstruction or free air. Electronically Signed   By: Lajean Manes M.D.   On: 08/02/2015 19:40    ASSESSMENT/PLAN:  Generalized weakness  - for rehabilitation, OT and PT  Chronic respiratory distress with hypoxia -  Has COPD and pulmonary vascular congestion; was given IVF and bicarb in hospital; continue O2 at 2 L/minute via [place]; continue Lasix 80 mg daily, DuoNeb and Singulair  Acute on chronic diastolic CHF - EF 0000000 continue Lasix 80 mg daily  Paroxysmal atrial fibrillation - rate controlled; recently decreased Eliquis to 2.5 mg BID; continue Amiodarone and nebivolol  Type 2 diabetes mellitus with nephropathy - hemoglobin A1c 9.3; continue NovoLog sliding scale 3 times a day; patient has poor appetite  Anemia of chronic disease - S/Ptransfusion of  2 Units packed RBC; hemoglobin 9.7; check CBC   Acute on  chronic kidney disease, stage III - creatinine 3.03; baseline creatinine 2.8; according to Palliative Care consult note, patient is not interested in HD; check BMP; continue sodium bicarbonate 650 mg 1 tab by mouth twice a day, Hectorol 2.5 mcg 1 tab daily, Bystolic 2.5 mg daily  Depression - continue Wellbutrin; sister will find out when is his appointment with his psychiatrist  Hyperlipidemia - continue Lipitor 80 mg 1 tab by mouth daily at bedtime Lab Results  Component Value Date   CHOL 165 06/04/2015   HDL 34.10* 06/04/2015   LDLCALC 107 02/21/2015   LDLDIRECT 97.0 06/04/2015   TRIG 266.0* 06/04/2015   CHOLHDL 5 06/04/2015   Hypothyroidism - continue Synthroid 112 g 1 tab by mouth daily Lab Results  Component Value Date   TSH 1.24 06/04/2015   PUD - Continue Protonix 40 mg 1 tab by mouth twice a day  Depression - continue Cymbalta 60 mg 1 capsule daily and Wellbutrin XL 300 mg 1 tab by mouth daily  COPD - continue Dalresp 500 g 1 tab by mouth daily, Spiriva 18 g daily, Symbicort 2  Puffs BID and Duoneb PRN  Allergic rhinitis - continue Flonase nasal spray daily  Chest pain - continue Ranexa ER 500 mg 1 tab BID  Constipation - continue Senokot 8.6 mg 1 tab BID  Poor appetite - start Eldertonic 15 ml BID  Protein calorie malnutrition, severe - albumin 1.9; start Procel 2 scoops by mouth twice a day    Goals of care:  Short-term rehabilitation     Durenda Age, NP [place] [phone]

## 2015-08-15 LAB — BASIC METABOLIC PANEL
BUN: 76 mg/dL — AB (ref 4–21)
CREATININE: 3.1 mg/dL — AB (ref 0.6–1.3)
Glucose: 149 mg/dL
POTASSIUM: 4.1 mmol/L (ref 3.4–5.3)
SODIUM: 141 mmol/L (ref 137–147)

## 2015-08-15 LAB — CBC AND DIFFERENTIAL
HCT: 31 % — AB (ref 41–53)
HEMOGLOBIN: 9.6 g/dL — AB (ref 13.5–17.5)
Platelets: 273 10*3/uL (ref 150–399)
WBC: 16.7 10^3/mL

## 2015-08-17 ENCOUNTER — Encounter: Payer: Self-pay | Admitting: Internal Medicine

## 2015-08-17 ENCOUNTER — Non-Acute Institutional Stay (SKILLED_NURSING_FACILITY): Payer: Medicare Other | Admitting: Internal Medicine

## 2015-08-17 ENCOUNTER — Encounter: Payer: Self-pay | Admitting: Nurse Practitioner

## 2015-08-17 DIAGNOSIS — I2510 Atherosclerotic heart disease of native coronary artery without angina pectoris: Secondary | ICD-10-CM | POA: Diagnosis not present

## 2015-08-17 DIAGNOSIS — I10 Essential (primary) hypertension: Secondary | ICD-10-CM | POA: Diagnosis not present

## 2015-08-17 DIAGNOSIS — R5381 Other malaise: Secondary | ICD-10-CM

## 2015-08-17 DIAGNOSIS — R0781 Pleurodynia: Secondary | ICD-10-CM | POA: Diagnosis not present

## 2015-08-17 DIAGNOSIS — E1142 Type 2 diabetes mellitus with diabetic polyneuropathy: Secondary | ICD-10-CM

## 2015-08-17 DIAGNOSIS — Z9861 Coronary angioplasty status: Secondary | ICD-10-CM | POA: Diagnosis not present

## 2015-08-17 DIAGNOSIS — I5032 Chronic diastolic (congestive) heart failure: Secondary | ICD-10-CM | POA: Diagnosis not present

## 2015-08-17 DIAGNOSIS — N189 Chronic kidney disease, unspecified: Secondary | ICD-10-CM | POA: Diagnosis not present

## 2015-08-17 DIAGNOSIS — J449 Chronic obstructive pulmonary disease, unspecified: Secondary | ICD-10-CM | POA: Diagnosis not present

## 2015-08-17 NOTE — Progress Notes (Signed)
Patient ID: Ricky Baker, male   DOB: Aug 11, 1935, 80 y.o.   MRN: [id number]   History and Physical      Location:  [place] Room Number: 1206 Place of Service:  SNF (31)  PCP: Estill Dooms, MD Patient Care Team: Estill Dooms, MD as PCP - General (Internal Medicine) Edrick Oh, MD as Consulting Physician (Nephrology) Dorothy Spark, MD as Consulting Physician (Cardiology) Rigoberto Noel, MD as Consulting Physician (Pulmonary Disease) Elayne Snare, MD as Consulting Physician (Endocrinology)  Extended Emergency Contact Information Primary Emergency Contact: [name],[name] Address: [place],  [address] [name] of [place] Phone: [phone] Mobile Phone: [phone] Relation: Sister Secondary Emergency Contact: [name], [place] Phone: [phone] Mobile Phone: [phone] Relation: Sister  Goals of Care: Advanced Directive information Advanced Directives 08/17/2015  Does patient have an advance directive? Yes  Type of Advance Directive Out of facility DNR (pink MOST or yellow form)  Does patient want to make changes to advanced directive? -  Copy of advanced directive(s) in chart? Yes      Chief Complaint  Patient presents with  . New Admit To SNF    following hospitalization 07/31/25 to 08/13/15 pleuritic chest pain    HPI: Patient is a 80 y.o. male seen today for admission to [facility] on 08/13/15 following hospitalization from 08/01/15 to 08/13/15. principal problem was pleuritic chest pain He had pneumonia diagnosed and treated with Augmentin and prednisone about 1 week prior to his hospitalization. Other issues included acute encephalopathy, acute on chronic diastolic heart failure, PAF, insulin using DM2 with nephropathy and CKD 3. Vomiting on admission resolved prior to discharge.  Progressive decline and deconditioning was noted. He  is admitted to the SNF for a Rehab stay. Goals are for increased strength and improvement in self care skills.  Multiple other problems are listed below and are considered stable.  Past Medical History:  Diagnosis Date  . Acquired autoimmune hypothyroidism   . Allergy   . Anemia   . Atrial fibrillation ([place])   . CAD (coronary artery disease)   . CHF (congestive heart failure) ([place])   . Chronic bronchitis ([place])   . CKD (chronic kidney disease)   . CKD (chronic kidney disease)   . Colon polyps   . COPD (chronic obstructive pulmonary disease) ([place])   . Depression   . Diabetes mellitus without complication ([place])   . Dyslexia   . Enlarged prostate   . Esophageal cancer (HCC)    esophogeal   . Flatulence/gas pain/belching   . GERD (gastroesophageal reflux disease)   . Headache    hx of - none in years  . History of renal stone   . Hyperlipidemia   . Hypertension   . Leukocytosis   . Loose stools   . Pleuritic chest pain   . Pneumonia   . Shortness of breath dyspnea    with exertion  . Stroke [facility])    ? TIA per pt  . Thyroid disease    Past Surgical History  Procedure Laterality Date  . Esophagus surgery  2006  . Spine surgery      bone spurs  . Cardiac catheterization  2009    two stents placed  . Tonsillectomy    . Eye surgery Bilateral     cataract surgery with  lens implants  . Colonoscopy    . Esophagogastroduodenoscopy (egd) with propofol N/A 05/16/2015    Procedure: ESOPHAGOGASTRODUODENOSCOPY (EGD) WITH PROPOFOL;  Surgeon: Mauri Pole, MD;  Location: [facility];  Service: Endoscopy;  Laterality: N/A;    reports that he quit smoking about 14 years ago. His smoking use included Cigarettes. He has a 42 pack-year smoking history. He has never used smokeless tobacco. He reports that he does not drink alcohol or use illicit drugs. Social History   Social History  . Marital Status: Single    Spouse Name: N/A  . Number of Children: N/A  . Years of Education:  N/A   Occupational History  . retired Psychologist, counselling ed    Social History Main Topics  . Smoking status: Former Smoker -- 1.00 packs/day for 42 years    Types: Cigarettes    Quit date: 04/07/2001  . Smokeless tobacco: Never Used  . Alcohol Use: No  . Drug Use: No  . Sexual Activity: No   Other Topics Concern  . Not on file   Social History Narrative   Admitted to [facility] 08/13/15   Never married   Retired Psychologist, counselling Ed   Former Smoker -stopped 2003   Alcohol none   DNR    Family History  Problem Relation Age of Onset  . COPD Father   . Diabetes Neg Hx     Health Maintenance  Topic Date Due  . TETANUS/TDAP  04/07/2016 (Originally 02/28/1955)  . FOOT EXAM  08/30/2015  . INFLUENZA VACCINE  11/06/2015  . HEMOGLOBIN A1C  01/31/2016  . OPHTHALMOLOGY EXAM  07/09/2016  . ZOSTAVAX  Completed  . PNA vac Low Risk Adult  Addressed    Allergies  Allergen Reactions  . Dust Mite Extract Other (See Comments)    Runny nose   . Pollen Extract Other (See Comments)    Runny nose      Medication List       This list is accurate as of: 08/17/15 11:05 AM.  Always use your most recent med list.               ACCU-CHEK AVIVA PLUS test strip  Generic drug:  glucose blood  TEST AS DIRECTED THREE TIMES DAILY     acetaminophen 325 MG tablet  Commonly known as:  TYLENOL  Take 2 tablets (650 mg total) by mouth every 4 (four) hours as needed for headache or mild pain.     amiodarone 200 MG tablet  Commonly known as:  PACERONE  Take 1 tablet (200 mg total) by mouth daily.     apixaban 2.5 MG Tabs tablet  Commonly known as:  ELIQUIS  Take 1 tablet (2.5 mg total) by mouth 2 (two) times daily.     atorvastatin 80 MG tablet  Commonly known as:  LIPITOR  Take 1 tablet (80 mg total) by mouth daily.     bisacodyl 10 MG suppository  Commonly known as:  DULCOLAX  Place 10 mg rectally. One per rectum daily as needed for 2 days constipation     budesonide-formoterol  160-4.5 MCG/ACT inhaler  Commonly known as:  SYMBICORT  Inhale 2 puffs into the lungs. Twice daily     buPROPion 300 MG 24 hr tablet  Commonly known as:  WELLBUTRIN XL  TAKE 1 TABLET(300 MG) BY MOUTH DAILY     doxercalciferol 2.5 MCG capsule  Commonly known as:  HECTOROL  TABLET 1 CAPSULE BY MOUTH DAILY  DULoxetine 60 MG capsule  Commonly known as:  CYMBALTA  Take 1 capsule (60 mg total) by mouth daily.     ELDERTONIC PO  Take by mouth. Elixir 15 ml twice daily for supplement     fluticasone 50 MCG/ACT nasal spray  Commonly known as:  FLONASE  Place 2 sprays into both nostrils daily.     furosemide 80 MG tablet  Commonly known as:  LASIX  Take 1 tablet (80 mg total) by mouth daily.     Insulin Syringe-Needle U-100 31G X 5/16" 0.3 ML Misc  Commonly known as:  LITETOUCH INSULIN SYRINGE  Use as directed     ipratropium-albuterol 0.5-2.5 (3) MG/3ML Soln  Commonly known as:  DUONEB  Take 3 mLs by nebulization every 6 (six) hours as needed.     ketoconazole 2 % shampoo  Commonly known as:  NIZORAL  Apply 1 application topically 2 (two) times a week.     levothyroxine 112 MCG tablet  Commonly known as:  SYNTHROID, LEVOTHROID  Take 112 mcg by mouth daily before breakfast.     montelukast 10 MG tablet  Commonly known as:  SINGULAIR  Take 10 mg by mouth at bedtime.     nebivolol 2.5 MG tablet  Commonly known as:  BYSTOLIC  Take 1 tablet (2.5 mg total) by mouth daily.     NOVOLOG 100 UNIT/ML injection  Generic drug:  insulin aspart  Inject 0-15 Units into the skin 3 (three) times daily before meals. 0-60 institute hypoglycemic protocol, 70-120 = 0 units, 121-150 = 2 units, 151-200 = 3 units, 201-250 = 5 units, 251-300 = 8 units, 301-350 = 11 units, 251-400 = 15 units, > 400 notify MD/NP     olopatadine 0.1 % ophthalmic solution  Commonly known as:  PATANOL  Place 1 drop into both eyes 2 (two) times daily as needed.     ondansetron 4 MG tablet  Commonly known as:   ZOFRAN  Take 1 tablet (4 mg total) by mouth every 8 (eight) hours as needed for nausea or vomiting.     oxyCODONE-acetaminophen 5-325 MG tablet  Commonly known as:  PERCOCET/ROXICET  Take 1-2 tablets by mouth every 3 (three) hours as needed for moderate pain.     OXYGEN  Inhale 2 mLs into the lungs continuous.     pantoprazole 40 MG tablet  Commonly known as:  PROTONIX  Take 1 tablet (40 mg total) by mouth 2 (two) times daily.     ranolazine 500 MG 12 hr tablet  Commonly known as:  RANEXA  Take 1 tablet (500 mg total) by mouth 2 (two) times daily.     roflumilast 500 MCG Tabs tablet  Commonly known as:  DALIRESP  Take 500 mcg by mouth daily.     senna 8.6 MG Tabs tablet  Commonly known as:  SENOKOT  Take 1 tablet (8.6 mg total) by mouth 2 (two) times daily.     sodium bicarbonate 650 MG tablet  Take 1 tablet (650 mg total) by mouth 2 (two) times daily.     Testosterone 20.25 MG/1.25GM (1.62%) Gel  Commonly known as:  ANDROGEL  Apply 3 pumps on each arm daily (6) pumps total     tiotropium 18 MCG inhalation capsule  Commonly known as:  SPIRIVA  Place 18 mcg into inhaler and inhale daily.     UNABLE TO FIND  Med Name: MedPass 120 mL TID between meals for nutritional support     Vitamin D3  50000 units Caps  Take 50,000 capsules by mouth every 30 (thirty) days.        Review of Systems  Constitutional: Positive for fatigue. Negative for activity change, appetite change, fever and unexpected weight change.  HENT: Negative for congestion, ear pain, hearing loss, rhinorrhea, sore throat, tinnitus, trouble swallowing and voice change.   Eyes: Positive for visual disturbance (corrective lenses).       Corrective lenses  Respiratory: Negative for cough, choking, chest tightness, shortness of breath and wheezing.        Chronic respiratory failure  Cardiovascular: Negative for chest pain, palpitations and leg swelling.       Aortic stenosis. CAD. Pericardial effusion. Hx  PAF on Eliquis.  Gastrointestinal: Negative for abdominal distention, abdominal pain, constipation, diarrhea and nausea.       Hx of esoh cancer  Endocrine: Negative for cold intolerance, heat intolerance, polydipsia, polyphagia and polyuria.       Insulin using DM 2 with nephropathy.  Genitourinary: Negative for dysuria, frequency, testicular pain and urgency.       Foley cath in place about 2 more weeks  Musculoskeletal: Negative for arthralgias, back pain, gait problem, myalgias and neck pain.  Skin: Negative for color change, pallor and rash.  Allergic/Immunologic: Negative.   Neurological: Positive for weakness (generalized). Negative for dizziness, tremors, syncope, speech difficulty, numbness and headaches.  Hematological: Negative for adenopathy. Does not bruise/bleed easily.  Psychiatric/Behavioral: Positive for confusion and dysphoric mood. Negative for behavioral problems, decreased concentration, hallucinations and sleep disturbance. The patient is nervous/anxious.     Filed Vitals:   08/17/15 1040  BP: 132/66  Pulse: 82  Temp: 97.4 F (36.3 C)  Resp: 18  Height: 5\' 11"  (1.803 m)  Weight: 209 lb (94.802 kg)  SpO2: 94%   Body mass index is 29.16 kg/(m^2). Physical Exam  Constitutional: He is oriented to person, place, and time. He appears well-developed. No distress.  Frail. Generalized weakness.  HENT:  Right Ear: External ear normal.  Left Ear: External ear normal.  Nose: Nose normal.  Mouth/Throat: Oropharynx is clear and moist. No oropharyngeal exudate.  Eyes: Conjunctivae and EOM are normal. Pupils are equal, round, and reactive to light.  Neck: No JVD present. No tracheal deviation present. No thyromegaly present.  Cardiovascular: Normal rate, regular rhythm and normal heart sounds.  Exam reveals no gallop and no friction rub.   No murmur heard. 3/6 aortic ejection murmur Diminshed DP and PT bilaterally.  Pulmonary/Chest: No respiratory distress. He has no  wheezes. He has no rales. He exhibits no tenderness.  Abdominal: He exhibits no distension and no mass. There is no tenderness.  Genitourinary:  Genitourinary Comments: Foley cath in place.  Musculoskeletal: Normal range of motion. He exhibits no edema or tenderness.  Lymphadenopathy:    He has no cervical adenopathy.  Neurological: He is alert and oriented to person, place, and time. He has normal reflexes. No cranial nerve deficit. Coordination normal.  Skin: No rash noted. No erythema. No pallor.  Psychiatric: He has a normal mood and affect. His behavior is normal. Judgment and thought content normal.    Labs reviewed: Basic Metabolic Panel:  Recent Labs  08/05/15 1656  08/07/15 0835  08/09/15 1136 08/10/15 0430 08/12/15 0502  NA  --   < > 129*  < > 133* 135 138  K  --   < > 5.3*  < > 5.3* 4.3 4.1  CL  --   < > 101  < >  102 103 102  CO2  --   < > 16*  < > 17* 18* 22  GLUCOSE  --   < > 149*  < > 159* 169* 137*  BUN  --   < > 65*  < > 77* 71* 69*  CREATININE  --   < > 3.44*  < > 3.59* 3.25* 3.03*  CALCIUM  --   < > 8.0*  < > 8.4* 8.2* 8.4*  MG 1.9  --   --   --   --   --   --   PHOS  --   < > 4.5  --  4.6  --  3.9  < > = values in this interval not displayed. Liver Function Tests:  Recent Labs  02/21/15 1004 06/20/15 1233 08/01/15 1620  08/07/15 0835 08/09/15 1136 08/12/15 0502  AST 11 15 12*  --   --   --   --   ALT 8* 14 9*  --   --   --   --   ALKPHOS 141* 105 109  --   --   --   --   BILITOT 0.5 0.5 0.8  --   --   --   --   PROT 5.6* 6.3 6.1*  --   --   --   --   ALBUMIN 3.6 3.3* 2.7*  < > 2.1* 2.3* 1.9*  < > = values in this interval not displayed. No results for input(s): LIPASE, AMYLASE in the last 8760 hours. No results for input(s): AMMONIA in the last 8760 hours. CBC:  Recent Labs  07/26/15 1005  08/02/15 0331  08/10/15 0430 08/11/15 0454 08/12/15 0502  WBC 12.7*  < > 17.4*  < > 17.1* 16.8* 14.0*  NEUTROABS 10.9*  --  14.9*  --   --   --   12.1*  HGB 9.9*  < > 9.1*  < > 7.2* 9.7* 9.7*  HCT 29.9*  < > 28.9*  < > 22.6* 29.5* 30.2*  MCV 89.7  < > 89.8  < > 88.3 87.0 86.8  PLT 300.0  < > 195  < > 435* 369 328  < > = values in this interval not displayed. Cardiac Enzymes:  Recent Labs  08/01/15 2154 08/02/15 0331 08/02/15 1052 08/05/15 1656 08/08/15 0444 08/09/15 0324  CKTOTAL  --   --   --  449* 66 67  TROPONINI 0.07* 0.06* 0.09*  --   --   --    BNP: Invalid input(s): POCBNP Lab Results  Component Value Date   HGBA1C 9.3* 08/01/2015   Lab Results  Component Value Date   TSH 1.24 06/04/2015   No results found for: VITAMINB12 No results found for: FOLATE No results found for: IRON, TIBC, FERRITIN  Imaging and Procedures obtained prior to SNF admission: Dg Chest 2 View  08/01/2015  CLINICAL DATA:  Chest pains and cough. Sore throat. History of esophageal cancer. EXAM: CHEST  2 VIEW COMPARISON:  07/26/2015 FINDINGS: Lungs are clear bilaterally. Retrocardiac density is compatible with history of esophageal cancer and previous gastric pull-through procedure. There is a nodular density in the right lower chest which was present on the exam from 09/21/2014 and may be related to a nipple shadow. Atherosclerotic calcifications in the thoracic aorta. IMPRESSION: No acute chest findings. Electronically Signed   By: Markus Daft M.D.   On: 08/01/2015 16:04   Dg Abd 2 Views  08/02/2015  CLINICAL DATA:  Vomiting x 1  hour. Pt denies any abdominal pain. Recent dx last week of PNA. Hx multiple abdominal conditions and surgeries. EXAM: ABDOMEN - 2 VIEW COMPARISON:  CT, 03/08/2015 FINDINGS: Normal bowel gas pattern.  No free air. There is a vague calcification adjacent to the L3 spinous process measuring approximately 16 mm greatest dimension. This represents a small nonspecific calcified mesenteric lesion on the prior CT, likely an area remote fat necrosis. No evidence of renal or ureteral stones. IMPRESSION: 1. No acute findings.  No  evidence of obstruction or free air. Electronically Signed   By: Lajean Manes M.D.   On: 08/02/2015 19:40   Chest x-ray 08/17/15 MOBILE X-RAY lower lung infiltrate.   Assessment/Plan 1. Physical deconditioning PT and OT for strengthening, safe mobility, and imrovement in self care skills.  2. Pleuritic chest pain resolved  3. CAD S/P percutaneous coronary angioplasty No current angina or palpitations  4. Chronic diastolic CHF (congestive heart failure), NYHA class 1 (HCC) compensated  5. Chronic kidney disease, unspecified CKD stage -CMP  6. Chronic obstructive pulmonary disease, unspecified COPD type ([place]) Continue current meds  7. Diabetic peripheral neuropathy associated with type 2 diabetes mellitus (HCC) - observe  8. Essential hypertension conytrolled

## 2015-08-18 ENCOUNTER — Emergency Department (HOSPITAL_COMMUNITY)
Admission: EM | Admit: 2015-08-18 | Discharge: 2015-08-19 | Disposition: A | Discharge status: Home / Self Care | Payer: Medicare Other | Attending: Emergency Medicine | Admitting: Emergency Medicine

## 2015-08-18 ENCOUNTER — Emergency Department (HOSPITAL_COMMUNITY): Payer: Medicare Other

## 2015-08-18 ENCOUNTER — Encounter (HOSPITAL_COMMUNITY): Payer: Self-pay | Admitting: Emergency Medicine

## 2015-08-18 DIAGNOSIS — Z7951 Long term (current) use of inhaled steroids: Secondary | ICD-10-CM | POA: Diagnosis not present

## 2015-08-18 DIAGNOSIS — Z79891 Long term (current) use of opiate analgesic: Secondary | ICD-10-CM | POA: Insufficient documentation

## 2015-08-18 DIAGNOSIS — Z7901 Long term (current) use of anticoagulants: Secondary | ICD-10-CM | POA: Diagnosis not present

## 2015-08-18 DIAGNOSIS — R112 Nausea with vomiting, unspecified: Secondary | ICD-10-CM | POA: Diagnosis present

## 2015-08-18 DIAGNOSIS — Z8673 Personal history of transient ischemic attack (TIA), and cerebral infarction without residual deficits: Secondary | ICD-10-CM | POA: Insufficient documentation

## 2015-08-18 DIAGNOSIS — I509 Heart failure, unspecified: Secondary | ICD-10-CM | POA: Diagnosis not present

## 2015-08-18 DIAGNOSIS — I130 Hypertensive heart and chronic kidney disease with heart failure and stage 1 through stage 4 chronic kidney disease, or unspecified chronic kidney disease: Secondary | ICD-10-CM | POA: Diagnosis not present

## 2015-08-18 DIAGNOSIS — Z8501 Personal history of malignant neoplasm of esophagus: Secondary | ICD-10-CM | POA: Diagnosis not present

## 2015-08-18 DIAGNOSIS — F329 Major depressive disorder, single episode, unspecified: Secondary | ICD-10-CM | POA: Insufficient documentation

## 2015-08-18 DIAGNOSIS — I2510 Atherosclerotic heart disease of native coronary artery without angina pectoris: Secondary | ICD-10-CM | POA: Diagnosis not present

## 2015-08-18 DIAGNOSIS — E1122 Type 2 diabetes mellitus with diabetic chronic kidney disease: Secondary | ICD-10-CM | POA: Diagnosis not present

## 2015-08-18 DIAGNOSIS — J449 Chronic obstructive pulmonary disease, unspecified: Secondary | ICD-10-CM | POA: Insufficient documentation

## 2015-08-18 DIAGNOSIS — N189 Chronic kidney disease, unspecified: Secondary | ICD-10-CM | POA: Insufficient documentation

## 2015-08-18 DIAGNOSIS — Z86010 Personal history of colonic polyps: Secondary | ICD-10-CM | POA: Insufficient documentation

## 2015-08-18 DIAGNOSIS — Z794 Long term (current) use of insulin: Secondary | ICD-10-CM | POA: Insufficient documentation

## 2015-08-18 DIAGNOSIS — E785 Hyperlipidemia, unspecified: Secondary | ICD-10-CM | POA: Diagnosis not present

## 2015-08-18 DIAGNOSIS — K219 Gastro-esophageal reflux disease without esophagitis: Secondary | ICD-10-CM | POA: Diagnosis not present

## 2015-08-18 DIAGNOSIS — Z87891 Personal history of nicotine dependence: Secondary | ICD-10-CM | POA: Diagnosis not present

## 2015-08-18 DIAGNOSIS — Z79899 Other long term (current) drug therapy: Secondary | ICD-10-CM | POA: Insufficient documentation

## 2015-08-18 LAB — COMPREHENSIVE METABOLIC PANEL
ALBUMIN: 2.7 g/dL — AB (ref 3.5–5.0)
ALK PHOS: 101 U/L (ref 38–126)
ALT: 10 U/L — ABNORMAL LOW (ref 17–63)
AST: 18 U/L (ref 15–41)
Anion gap: 13 (ref 5–15)
BILIRUBIN TOTAL: 0.9 mg/dL (ref 0.3–1.2)
BUN: 78 mg/dL — AB (ref 6–20)
CALCIUM: 8.5 mg/dL — AB (ref 8.9–10.3)
CO2: 27 mmol/L (ref 22–32)
Chloride: 104 mmol/L (ref 101–111)
Creatinine, Ser: 2.44 mg/dL — ABNORMAL HIGH (ref 0.61–1.24)
GFR calc Af Amer: 28 mL/min — ABNORMAL LOW (ref >60)
GFR calc non Af Amer: 24 mL/min — ABNORMAL LOW (ref >60)
GLUCOSE: 186 mg/dL — AB (ref 65–99)
POTASSIUM: 3.9 mmol/L (ref 3.5–5.1)
SODIUM: 144 mmol/L (ref 135–145)
TOTAL PROTEIN: 6 g/dL — AB (ref 6.5–8.1)

## 2015-08-18 LAB — URINE MICROSCOPIC-ADD ON

## 2015-08-18 LAB — URINALYSIS, ROUTINE W REFLEX MICROSCOPIC
BILIRUBIN URINE: NEGATIVE
GLUCOSE, UA: NEGATIVE mg/dL
KETONES UR: NEGATIVE mg/dL
Nitrite: NEGATIVE
PH: 5 (ref 5.0–8.0)
PROTEIN: 30 mg/dL — AB
Specific Gravity, Urine: 1.012 (ref 1.005–1.030)

## 2015-08-18 LAB — CBC WITH DIFFERENTIAL/PLATELET
BASOS ABS: 0 10*3/uL (ref 0.0–0.1)
Basophils Relative: 0 %
EOS PCT: 0 %
Eosinophils Absolute: 0.1 10*3/uL (ref 0.0–0.7)
HEMATOCRIT: 33.7 % — AB (ref 39.0–52.0)
Hemoglobin: 10.7 g/dL — ABNORMAL LOW (ref 13.0–17.0)
LYMPHS PCT: 5 %
Lymphs Abs: 0.7 10*3/uL (ref 0.7–4.0)
MCH: 28.3 pg (ref 26.0–34.0)
MCHC: 31.8 g/dL (ref 30.0–36.0)
MCV: 89.2 fL (ref 78.0–100.0)
MONO ABS: 0.7 10*3/uL (ref 0.1–1.0)
MONOS PCT: 5 %
Neutro Abs: 13.2 10*3/uL — ABNORMAL HIGH (ref 1.7–7.7)
Neutrophils Relative %: 90 %
PLATELETS: 243 10*3/uL (ref 150–400)
RBC: 3.78 MIL/uL — ABNORMAL LOW (ref 4.22–5.81)
RDW: 16 % — AB (ref 11.5–15.5)
WBC: 14.7 10*3/uL — ABNORMAL HIGH (ref 4.0–10.5)

## 2015-08-18 LAB — I-STAT TROPONIN, ED: Troponin i, poc: 0.05 ng/mL (ref 0.00–0.08)

## 2015-08-18 MED ORDER — ONDANSETRON HCL 4 MG/2ML IJ SOLN
4.0000 mg | Freq: Once | INTRAMUSCULAR | Status: AC
  Administered 2015-08-18: 4 mg via INTRAVENOUS
  Filled 2015-08-18: qty 2

## 2015-08-18 MED ORDER — DEXTROSE 5 % IV SOLN
1.0000 g | Freq: Once | INTRAVENOUS | Status: AC
  Administered 2015-08-18: 1 g via INTRAVENOUS
  Filled 2015-08-18: qty 10

## 2015-08-18 NOTE — ED Notes (Signed)
[place] Route (Sister & Medical Power of Attorney)  Cell : [phone]

## 2015-08-18 NOTE — ED Notes (Signed)
[facility] RN was called and notified of pt's discharge and prescriptions---- was made aware that pt's IV is to be kept in place for IV medications to be administered at the facility.  Prescriptions and instructions for Zofran 4 mg IV and Rocephin 1 g IV provided---- facility staff RN verbalized understanding.

## 2015-08-18 NOTE — ED Provider Notes (Signed)
CSN: [id number]     Arrival date & time 08/18/15  1612 History   First MD Initiated Contact with Patient 08/18/15 1622     Chief Complaint  Patient presents with  . Emesis      Patient is a 80 y.o. male presenting with vomiting. The history is provided by the patient and the EMS personnel. No language interpreter was used.  Emesis  Ricky Baker is a 80 y.o. male who presents to the Emergency Department complaining of vomiting.  He presents from [facility] rehab for evaluation of vomiting. He was discharged to rehabilitation following a hospitalization from our pneumonia. Since arrival at the nursing facility he has vomited or refused  all oral medications as he is experiencing recurrent nausea, vomiting and upset stomach. He states that he's had a two-year history of intermittent nausea and vomiting. He also endorses chronic shortness of breath cough, unchanged from baseline. He has associated left-sided low back pain but is on going for the last several weeks. He had an indwelling Foley catheter placed 2 weeks ago, last changed today. No fevers. Symptoms are moderate, constant, worsening.He is currently on palliative care for his symptoms.   Past Medical History  Diagnosis Date  . COPD (chronic obstructive pulmonary disease) ([place])   . CHF (congestive heart failure) ([place])   . Thyroid disease   . Chronic bronchitis ([place])   . Atrial fibrillation ([place])   . CKD (chronic kidney disease)   . Diabetes mellitus without complication ([place])   . Allergy   . Esophageal cancer ([facility])     esophogeal   . CKD (chronic kidney disease)   . Depression   . Hypertension   . Colon polyps   . CAD (coronary artery disease)   . Hyperlipidemia   . GERD (gastroesophageal reflux disease)   . History of renal stone   . Stroke [facility])     ? TIA per pt  . Shortness of breath dyspnea     with exertion  . Pneumonia   . Dyslexia   . Enlarged prostate   . Headache     hx of - none in years  . Anemia   .  Loose stools   . Flatulence/gas pain/belching   . Pleuritic chest pain   . Acquired autoimmune hypothyroidism   . Leukocytosis    Past Surgical History  Procedure Laterality Date  . Esophagus surgery  2006  . Spine surgery      bone spurs  . Cardiac catheterization  2009    two stents placed  . Tonsillectomy    . Eye surgery Bilateral     cataract surgery with lens implants  . Colonoscopy    . Esophagogastroduodenoscopy (egd) with propofol N/A 05/16/2015    Procedure: ESOPHAGOGASTRODUODENOSCOPY (EGD) WITH PROPOFOL;  Surgeon: Mauri Pole, MD;  Location: [facility] END[facility];  Service: Endoscopy;  Laterality: N/A;   Family History  Problem Relation Age of Onset  . COPD Father   . Diabetes Neg Hx    Social History  Substance Use Topics  . Smoking status: Former Smoker -- 1.00 packs/day for 42 years    Types: Cigarettes    Quit date: 04/07/2001  . Smokeless tobacco: Never Used  . Alcohol Use: No    Review of Systems  Gastrointestinal: Positive for vomiting.  All other systems reviewed and are negative.     Allergies  Dust mite extract and Pollen extract  Home Medications   Prior to Admission medications   Medication  Sig Start Date End Date Taking? Authorizing Provider  acetaminophen (TYLENOL) 325 MG tablet Take 2 tablets (650 mg total) by mouth every 4 (four) hours as needed for headache or mild pain. 08/13/15  Yes Robbie Lis, MD  amiodarone (PACERONE) 200 MG tablet Take 1 tablet (200 mg total) by mouth daily. 08/13/15  Yes Robbie Lis, MD  apixaban (ELIQUIS) 2.5 MG TABS tablet Take 1 tablet (2.5 mg total) by mouth 2 (two) times daily. 08/13/15  Yes Robbie Lis, MD  budesonide-formoterol [facility]) 160-4.5 MCG/ACT inhaler Inhale 2 puffs into the lungs. Twice daily   Yes Historical Provider, MD  buPROPion (WELLBUTRIN XL) 300 MG 24 hr tablet TAKE 1 TABLET(300 MG) BY MOUTH DAILY 07/23/15  Yes Brunetta Jeans, PA-C  doxercalciferol (HECTOROL) 2.5 MCG capsule TABLET 1  CAPSULE BY MOUTH DAILY 03/16/15  Yes Brunetta Jeans, PA-C  DULoxetine (CYMBALTA) 60 MG capsule Take 1 capsule (60 mg total) by mouth daily. 08/14/14  Yes Brunetta Jeans, PA-C  fluticasone (FLONASE) 50 MCG/ACT nasal spray Place 2 sprays into both nostrils daily. 07/12/15  Yes Edward Saguier, PA-C  furosemide (LASIX) 80 MG tablet Take 1 tablet (80 mg total) by mouth daily. 08/13/15  Yes Robbie Lis, MD  insulin lispro (HUMALOG) 100 UNIT/ML injection Inject 0-15 Units into the skin 3 (three) times daily with meals. Sliding scale: 70-120 = 0 units, 121-150 = 2 units, 151-200 = 3 units, 201-250 = 5 units, 251-300 = 8 units, 301-350 - 11 units, 351-400 15 units, >400 call md   Yes Historical Provider, MD  ipratropium-albuterol (DUONEB) 0.5-2.5 (3) MG/3ML SOLN Take 3 mLs by nebulization every 6 (six) hours as needed. 05/29/15  Yes Brunetta Jeans, PA-C  ketoconazole (NIZORAL) 2 % shampoo Apply 1 application topically 2 (two) times a week. 01/02/15  Yes Brunetta Jeans, PA-C  levothyroxine (SYNTHROID, LEVOTHROID) 112 MCG tablet Take 112 mcg by mouth daily before breakfast.   Yes Historical Provider, MD  montelukast (SINGULAIR) 10 MG tablet Take 10 mg by mouth at bedtime.   Yes Historical Provider, MD  Multiple Vitamins-Minerals (ELDERTONIC PO) Take by mouth. Elixir 15 ml twice daily for supplement   Yes Historical Provider, MD  nebivolol (BYSTOLIC) 2.5 MG tablet Take 1 tablet (2.5 mg total) by mouth daily. 08/13/15  Yes Robbie Lis, MD  olopatadine (PATANOL) 0.1 % ophthalmic solution Place 1 drop into both eyes 2 (two) times daily as needed. 05/29/15  Yes Brunetta Jeans, PA-C  ondansetron (ZOFRAN) 4 MG tablet Take 1 tablet (4 mg total) by mouth every 8 (eight) hours as needed for nausea or vomiting. 05/07/15  Yes Mauri Pole, MD  oxyCODONE-acetaminophen (PERCOCET/ROXICET) 5-325 MG tablet Take 1-2 tablets by mouth every 3 (three) hours as needed for moderate pain. 08/13/15  Yes Robbie Lis, MD   pantoprazole (PROTONIX) 40 MG tablet Take 1 tablet (40 mg total) by mouth 2 (two) times daily. 08/13/15  Yes Robbie Lis, MD  Protein (PROCEL 100) POWD Take 2 scoop by mouth 2 (two) times daily.   Yes Historical Provider, MD  ranolazine (RANEXA) 500 MG 12 hr tablet Take 1 tablet (500 mg total) by mouth 2 (two) times daily. 07/17/15  Yes Dorothy Spark, MD  roflumilast (DALIRESP) 500 MCG TABS tablet Take 500 mcg by mouth daily.   Yes Historical Provider, MD  senna (SENOKOT) 8.6 MG TABS tablet Take 1 tablet (8.6 mg total) by mouth 2 (two) times daily. 08/13/15  Yes  Robbie Lis, MD  sodium bicarbonate 650 MG tablet Take 1 tablet (650 mg total) by mouth 2 (two) times daily. 08/13/15  Yes Robbie Lis, MD  Testosterone (ANDROGEL) 20.25 MG/1.25GM (1.62%) GEL Apply 3 pumps on each arm daily (6) pumps total 03/08/15  Yes Elayne Snare, MD  tiotropium (SPIRIVA) 18 MCG inhalation capsule Place 18 mcg into inhaler and inhale daily.   Yes Historical Provider, MD  ACCU-CHEK AVIVA PLUS test strip TEST AS DIRECTED THREE TIMES DAILY Patient not taking: Reported on 08/14/2015 10/19/14   Brunetta Jeans, PA-C  Insulin Syringe-Needle U-100 [facility] INSULIN SYRINGE) 31G X 5/16" 0.3 ML MISC Use as directed Patient not taking: Reported on 08/14/2015 12/14/14   Elayne Snare, MD  OXYGEN Inhale 2 mLs into the lungs continuous.     Historical Provider, MD   BP 124/79 mmHg  Pulse 89  Temp(Src) 98.4 F (36.9 C) (Oral)  Resp 18  SpO2 98% Physical Exam  Constitutional: He is oriented to person, place, and time. He appears well-developed and well-nourished.  HENT:  Head: Normocephalic and atraumatic.  Cardiovascular: Normal rate and regular rhythm.   No murmur heard. Pulmonary/Chest: Effort normal. No respiratory distress.  Scattered rhonchi and crackles bilaterally  Abdominal: Soft. There is no tenderness. There is no rebound and no guarding.  Genitourinary:  Foley catheter in place with sediment present  Musculoskeletal:  He exhibits no edema or tenderness.  Neurological: He is alert and oriented to person, place, and time.  Generalized weakness  Skin: Skin is warm and dry.  Psychiatric: He has a normal mood and affect. His behavior is normal.  Nursing note and vitals reviewed.   ED Course  Procedures (including critical care time) Labs Review Labs Reviewed  COMPREHENSIVE METABOLIC PANEL - Abnormal; Notable for the following:    Glucose, Bld 186 (*)    BUN 78 (*)    Creatinine, Ser 2.44 (*)    Calcium 8.5 (*)    Total Protein 6.0 (*)    Albumin 2.7 (*)    ALT 10 (*)    GFR calc non Af Amer 24 (*)    GFR calc Af Amer 28 (*)    All other components within normal limits  CBC WITH DIFFERENTIAL/PLATELET - Abnormal; Notable for the following:    WBC 14.7 (*)    RBC 3.78 (*)    Hemoglobin 10.7 (*)    HCT 33.7 (*)    RDW 16.0 (*)    Neutro Abs 13.2 (*)    All other components within normal limits  URINALYSIS, ROUTINE W REFLEX MICROSCOPIC (NOT AT [facility]) - Abnormal; Notable for the following:    APPearance TURBID (*)    Hgb urine dipstick LARGE (*)    Protein, ur 30 (*)    Leukocytes, UA LARGE (*)    All other components within normal limits  URINE MICROSCOPIC-ADD ON - Abnormal; Notable for the following:    Squamous Epithelial / LPF 0-5 (*)    Bacteria, UA MANY (*)    Crystals URIC ACID CRYSTALS (*)    All other components within normal limits  URINE CULTURE  I-STAT TROPOININ, ED    Imaging Review Dg Chest 2 View  08/18/2015  CLINICAL DATA:  Cough, COPD EXAM: CHEST  2 VIEW COMPARISON:  08/09/2015 FINDINGS: Cardiomegaly again noted. No pulmonary edema. Central vascular congestion. Small bilateral pleural effusion with hazy bilateral basilar atelectasis or infiltrate. IMPRESSION: Cardiomegaly. Central vascular congestion without pulmonary edema. Small bilateral pleural effusion with bilateral  basilar atelectasis or infiltrate. Electronically Signed   By: Lahoma Crocker M.D.   On: 08/18/2015 17:36   I  have personally reviewed and evaluated these images and lab results as part of my medical decision-making.   EKG Interpretation   Date/Time:  Saturday Aug 18 2015 17:05:46 EDT Ventricular Rate:  94 PR Interval:  113 QRS Duration: 107 QT Interval:  385 QTC Calculation: 481 R Axis:   54 Text Interpretation:  Sinus rhythm Multiple ventricular premature  complexes Borderline short PR interval Incomplete left bundle branch block  Borderline low voltage, extremity leads Anterior Q waves, possibly due to  ILBBB Artifact Confirmed by Hazle Coca [phone]) on 08/18/2015 5:48:17 PM      MDM   Final diagnoses:  Non-intractable vomiting with nausea, vomiting of unspecified type    Patient here for vomiting, not taking oral medications for the last 2 days. He is currently on palliative care for vomiting and multiple medical comorbidities. Chest x-ray without any evidence of definite pneumonia and it appears to be improving from recent hospitalization. CBC with stable leukocytosis. UA is concerning for possible UTI, we will treat with Rocephin. Patient has occasional retching in the department but is able to tolerate some orals. Plan to DC to rehabilitation with IV for antibiotics for his urinary tract infection and nausea control with anti-emetics. He will be followed by palliative care. Return precautions were discussed.    Quintella Reichert, MD 08/19/15 [phone]

## 2015-08-18 NOTE — ED Notes (Signed)
Recently dc'd from [facility] for pneumonia. Returned back to [facility]. Having difficulty tolerating oral antibiotics due to nausea/vomiting.

## 2015-08-18 NOTE — ED Notes (Signed)
[facility] was called for pt's transportation back to [facility].

## 2015-08-18 NOTE — ED Notes (Signed)
Bed: [id number] Expected date:  Expected time:  Means of arrival:  Comments: EMS- d/c 2 days ago for PNA

## 2015-08-18 NOTE — Discharge Instructions (Signed)
[name] had a urinalysis and urine culture sent today.     Nausea and Vomiting Nausea is a sick feeling that often comes before throwing up (vomiting). Vomiting is a reflex where stomach contents come out of your mouth. Vomiting can cause severe loss of body fluids (dehydration). Children and elderly adults can become dehydrated quickly, especially if they also have diarrhea. Nausea and vomiting are symptoms of a condition or disease. It is important to find the cause of your symptoms. CAUSES   Direct irritation of the stomach lining. This irritation can result from increased acid production (gastroesophageal reflux disease), infection, food poisoning, taking certain medicines (such as nonsteroidal anti-inflammatory drugs), alcohol use, or tobacco use.  Signals from the brain.These signals could be caused by a headache, heat exposure, an inner ear disturbance, increased pressure in the brain from injury, infection, a tumor, or a concussion, pain, emotional stimulus, or metabolic problems.  An obstruction in the gastrointestinal tract (bowel obstruction).  Illnesses such as diabetes, hepatitis, gallbladder problems, appendicitis, kidney problems, cancer, sepsis, atypical symptoms of a heart attack, or eating disorders.  Medical treatments such as chemotherapy and radiation.  Receiving medicine that makes you sleep (general anesthetic) during surgery. DIAGNOSIS Your caregiver may ask for tests to be done if the problems do not improve after a few days. Tests may also be done if symptoms are severe or if the reason for the nausea and vomiting is not clear. Tests may include:  Urine tests.  Blood tests.  Stool tests.  Cultures (to look for evidence of infection).  X-rays or other imaging studies. Test results can help your caregiver make decisions about treatment or the need for additional tests. TREATMENT You need to stay well hydrated. Drink frequently but in small amounts.You may  wish to drink water, sports drinks, clear broth, or eat frozen ice pops or gelatin dessert to help stay hydrated.When you eat, eating slowly may help prevent nausea.There are also some antinausea medicines that may help prevent nausea. HOME CARE INSTRUCTIONS   Take all medicine as directed by your caregiver.  If you do not have an appetite, do not force yourself to eat. However, you must continue to drink fluids.  If you have an appetite, eat a normal diet unless your caregiver tells you differently.  Eat a variety of complex carbohydrates (rice, wheat, potatoes, bread), lean meats, yogurt, fruits, and vegetables.  Avoid high-fat foods because they are more difficult to digest.  Drink enough water and fluids to keep your urine clear or pale yellow.  If you are dehydrated, ask your caregiver for specific rehydration instructions. Signs of dehydration may include:  Severe thirst.  Dry lips and mouth.  Dizziness.  Dark urine.  Decreasing urine frequency and amount.  Confusion.  Rapid breathing or pulse. SEEK IMMEDIATE MEDICAL CARE IF:   You have blood or brown flecks (like coffee grounds) in your vomit.  You have black or bloody stools.  You have a severe headache or stiff neck.  You are confused.  You have severe abdominal pain.  You have chest pain or trouble breathing.  You do not urinate at least once every 8 hours.  You develop cold or clammy skin.  You continue to vomit for longer than 24 to 48 hours.  You have a fever. MAKE SURE YOU:   Understand these instructions.  Will watch your condition.  Will get help right away if you are not doing well or get worse.   This information is not  intended to replace advice given to you by your health care provider. Make sure you discuss any questions you have with your health care provider.   Document Released: 03/24/2005 Document Revised: 06/16/2011 Document Reviewed: 08/21/2010 Elsevier Interactive Patient  Education [employer].

## 2015-08-19 NOTE — ED Notes (Signed)
[facility] here to transport pt back to [facility]. 

## 2015-08-20 ENCOUNTER — Encounter: Payer: Self-pay | Admitting: Adult Health

## 2015-08-20 ENCOUNTER — Non-Acute Institutional Stay (SKILLED_NURSING_FACILITY): Payer: Medicare Other | Admitting: Adult Health

## 2015-08-20 DIAGNOSIS — N390 Urinary tract infection, site not specified: Secondary | ICD-10-CM | POA: Diagnosis not present

## 2015-08-20 DIAGNOSIS — R112 Nausea with vomiting, unspecified: Secondary | ICD-10-CM | POA: Diagnosis not present

## 2015-08-20 LAB — URINE CULTURE

## 2015-08-20 NOTE — Progress Notes (Signed)
Patient ID: Ricky Baker, male   DOB: [birth year], 80 y.o.   MRN: [id number]    DATE:   08/20/15  MRN:  [id number]  BIRTHDAY: 03-23-1936  Facility:  Nursing Home Location:  [place] Room Number: 1206-P  LEVEL OF CARE:  SNF (31)  Contact Information    Name Relation Home Work Mobile   [place] Sister [phone]  [place] [phone]  [phone]       Code Status History    Date Active Date Inactive Code Status Order ID Comments User Context   08/12/2015  1:07 PM 08/13/2015  8:53 PM DNR [id number]  Elwin Mocha, MD Inpatient   08/01/2015  9:44 PM 08/12/2015  1:07 PM Full Code [id number]  Vianne Bulls, MD Inpatient    Questions for Most Recent Historical Code Status (Order [id number])    Question Answer Comment   In the event of cardiac or respiratory ARREST Do not call a "code blue"    In the event of cardiac or respiratory ARREST Do not perform Intubation, CPR, defibrillation or ACLS    In the event of cardiac or respiratory ARREST Use medication by any route, position, wound care, and other measures to relive pain and suffering. May use oxygen, suction and manual treatment of airway obstruction as needed for comfort.     Advance Directive Documentation        Most Recent Value   Type of Advance Directive  Out of facility DNR (pink MOST or yellow form)   Pre-existing out of facility DNR order (yellow form or pink MOST form)     "MOST" Form in Place?         Chief Complaint  Patient presents with  . Acute Visit    UTI    HISTORY OF PRESENT ILLNESS:  This is a 80 year old male who was transferred to the hospital on 08/18/15 due to nausea and vomiting. He has been refusing to take his oral medications. He came back to [facility] on 08/19/15 with Rocephin IV for UTI. He has PIV access that needs to be changed since it has been there for 3 days.  He has been first admitted to [facility] on 08/13/15 from  [facility]. He has PMH BPH with urinary retention, PAF on Eliquis, chronic diastolic CHF, COPD with home O2 requirement, CKD stage III, hypothyroidism, depression and esophageal cancer S/P resection in 2006. He was recently treated by his PCP for PNA with a course of Augmentin and Prednisone. Fever and dyspnea were resolved but continued to have chest pain. He went to [facility]. CXR showed resolution of prior infiltrate, wbc 21.4 and hgb 10.2 . Troponin was 0.06 and BNP 483, elevated. NA 128 and glucose 504. He was given 12 units SQ Novolog and was bolused with NS 1.5L  . He was then transferred to [facility], slightly elevated troponin had a flat trend and pleuritic chest pain was thought to be musculoskeletal secondary to pneumonia. EKG showed no ischemia.He had progressive decline and more confusion while in the hospital. Gabapentin was held and had better mental status.  Latest creatinine is 3.03 with baseline 2.8. He has CKD stage III and palliative consulted in hospital. He is not interested in HD according to Palliative notes. He has received 2 units of PRBC while in the hospital. Latest hgb 9.7. He has a history of PUD and gastritis evident  on EGD on 2/17. Given his risk of recurrent bleed and CKD stage IV, his Eliquis was lowered to 2.5 mg BID.  He has been admitted for a short-term rehabilitation and is currently on Palliative care.   PAST MEDICAL HISTORY:  Past Medical History  Diagnosis Date  . COPD (chronic obstructive pulmonary disease) ([place])   . CHF (congestive heart failure) ([place])   . Thyroid disease   . Chronic bronchitis ([place])   . Atrial fibrillation ([place])   . CKD (chronic kidney disease)   . Diabetes mellitus without complication ([place])   . Allergy   . Esophageal cancer ([facility])     esophogeal   . CKD (chronic kidney disease)   . Depression   . Hypertension   . Colon polyps   . CAD (coronary artery disease)   .  Hyperlipidemia   . GERD (gastroesophageal reflux disease)   . History of renal stone   . Stroke [facility])     ? TIA per pt  . Shortness of breath dyspnea     with exertion  . Pneumonia   . Dyslexia   . Enlarged prostate   . Headache     hx of - none in years  . Anemia   . Loose stools   . Flatulence/gas pain/belching   . Pleuritic chest pain   . Acquired autoimmune hypothyroidism   . Leukocytosis      CURRENT MEDICATIONS: Reviewed  Patient's Medications  New Prescriptions   No medications on file  Previous Medications   ACCU-CHEK AVIVA PLUS TEST STRIP    TEST AS DIRECTED THREE TIMES DAILY   ACETAMINOPHEN (TYLENOL) 325 MG TABLET    Take 2 tablets (650 mg total) by mouth every 4 (four) hours as needed for headache or mild pain.   AMIODARONE (PACERONE) 200 MG TABLET    Take 1 tablet (200 mg total) by mouth daily.   APIXABAN (ELIQUIS) 2.5 MG TABS TABLET    Take 1 tablet (2.5 mg total) by mouth 2 (two) times daily.   BUDESONIDE-FORMOTEROL (SYMBICORT) 160-4.5 MCG/ACT INHALER    Inhale 2 puffs into the lungs. Twice daily   BUPROPION (WELLBUTRIN XL) 300 MG 24 HR TABLET    TAKE 1 TABLET(300 MG) BY MOUTH DAILY   CEFTRIAXONE (ROCEPHIN) 1 G INJECTION    Inject 1 g into the muscle daily. Take for 7 days   DOXERCALCIFEROL (HECTOROL) 2.5 MCG CAPSULE    TABLET 1 CAPSULE BY MOUTH DAILY   DULOXETINE (CYMBALTA) 60 MG CAPSULE    Take 1 capsule (60 mg total) by mouth daily.   FLUTICASONE (FLONASE) 50 MCG/ACT NASAL SPRAY    Place 2 sprays into both nostrils daily.   FUROSEMIDE (LASIX) 80 MG TABLET    Take 1 tablet (80 mg total) by mouth daily.   INSULIN LISPRO (HUMALOG) 100 UNIT/ML INJECTION    Inject 0-15 Units into the skin 3 (three) times daily with meals. Sliding scale: 70-120 = 0 units, 121-150 = 2 units, 151-200 = 3 units, 201-250 = 5 units, 251-300 = 8 units, 301-350 - 11 units, 351-400 15 units, >400 call md   INSULIN SYRINGE-NEEDLE U-100 (LITETOUCH INSULIN SYRINGE) 31G X 5/16" 0.3 ML MISC    Use  as directed   IPRATROPIUM-ALBUTEROL (DUONEB) 0.5-2.5 (3) MG/3ML SOLN    Take 3 mLs by nebulization every 6 (six) hours as needed.   KETOCONAZOLE (NIZORAL) 2 % SHAMPOO    Apply 1 application topically 2 (two) times a week.   LEVOTHYROXINE (SYNTHROID,  LEVOTHROID) 112 MCG TABLET    Take 112 mcg by mouth daily before breakfast.   MONTELUKAST (SINGULAIR) 10 MG TABLET    Take 10 mg by mouth at bedtime.   MULTIPLE VITAMINS-MINERALS (ELDERTONIC PO)    Take by mouth. Elixir 15 ml twice daily for supplement   NEBIVOLOL (BYSTOLIC) 2.5 MG TABLET    Take 1 tablet (2.5 mg total) by mouth daily.   OLOPATADINE (PATANOL) 0.1 % OPHTHALMIC SOLUTION    Place 1 drop into both eyes 2 (two) times daily as needed.   ONDANSETRON (ZOFRAN) 4 MG TABLET    Take 1 tablet (4 mg total) by mouth every 8 (eight) hours as needed for nausea or vomiting.   OXYCODONE-ACETAMINOPHEN (PERCOCET/ROXICET) 5-325 MG TABLET    Take 1-2 tablets by mouth every 3 (three) hours as needed for moderate pain.   OXYGEN    Inhale 2 mLs into the lungs continuous.    PANTOPRAZOLE (PROTONIX) 40 MG TABLET    Take 1 tablet (40 mg total) by mouth 2 (two) times daily.   PROMETHAZINE (PHENERGAN) 25 MG SUPPOSITORY    Place 25 mg rectally every 8 (eight) hours as needed for nausea or vomiting.   PROTEIN (PROCEL 100) POWD    Take 2 scoop by mouth 2 (two) times daily.   RANOLAZINE (RANEXA) 500 MG 12 HR TABLET    Take 1 tablet (500 mg total) by mouth 2 (two) times daily.   ROFLUMILAST (DALIRESP) 500 MCG TABS TABLET    Take 500 mcg by mouth daily.   SENNA (SENOKOT) 8.6 MG TABS TABLET    Take 1 tablet (8.6 mg total) by mouth 2 (two) times daily.   SODIUM BICARBONATE 650 MG TABLET    Take 1 tablet (650 mg total) by mouth 2 (two) times daily.   TESTOSTERONE (ANDROGEL) 20.25 MG/1.25GM (1.62%) GEL    Apply 3 pumps on each arm daily (6) pumps total   TIOTROPIUM (SPIRIVA) 18 MCG INHALATION CAPSULE    Place 18 mcg into inhaler and inhale daily.  Modified Medications   No  medications on file  Discontinued Medications   No medications on file     Allergies  Allergen Reactions  . Dust Mite Extract Other (See Comments)    Runny nose   . Pollen Extract Other (See Comments)    Runny nose    REVIEW OF SYSTEMS:  GENERAL:  no fever, chills ; +poor appetite EYES: Denies change in vision, dry eyes, eye pain, itching or discharge EARS: Denies change in hearing, ringing in ears, or earache NOSE: Denies nasal congestion or epistaxis MOUTH and THROAT: Denies oral discomfort, gingival pain or bleeding, pain from teeth or hoarseness   RESPIRATORY: no cough, SOB, DOE, wheezing, hemoptysis CARDIAC: no chest pain, edema or palpitations GI: no abdominal pain, diarrhea, constipation, heart burn, nausea or vomiting GU: Denies dysuria, frequency, hematuria, incontinence, or discharge PSYCHIATRIC: Denies feeling of anxiety. No report of hallucinations, insomnia, paranoia, or agitation, + depression   PHYSICAL EXAMINATION  GENERAL APPEARANCE: Well nourished. In no acute distress. Normal body habitus SKIN:  Noted to have unstageable sacral pressure ulcer HEAD: Normal in size and contour. No evidence of trauma EYES: Lids open and close normally. No blepharitis, entropion or ectropion. PERRL. Conjunctivae are clear and sclerae are white. Lenses are without opacity EARS: Pinnae are normal. Patient hears normal voice tunes of the examiner MOUTH and THROAT: Lips are without lesions. Oral mucosa is moist and without lesions. Tongue is normal in shape, size,  and color and without lesions NECK: supple, trachea midline, no neck masses, no thyroid tenderness, no thyromegaly LYMPHATICS: no LAN in the neck, no supraclavicular LAN RESPIRATORY: breathing is even & unlabored, BS CTAB, O2 @ 2L/min via  CARDIAC: RRR, no murmur,no extra heart sounds, no edema GI: abdomen soft, normal BS, no masses, no tenderness, no hepatomegaly, no splenomegaly GU:  Has FC EXTREMITIES:  Able to  move X 4 extremities PSYCHIATRIC: Alert and oriented X 3. Affect and behavior are appropriate  LABS/RADIOLOGY: Labs reviewed: Basic Metabolic Panel:  Recent Labs  08/05/15 1656  08/07/15 0835  08/09/15 1136 08/10/15 0430 08/12/15 0502 08/15/15 08/18/15 1708  NA  --   < > 129*  < > 133* 135 138 141 144  K  --   < > 5.3*  < > 5.3* 4.3 4.1 4.1 3.9  CL  --   < > 101  < > 102 103 102  --  104  CO2  --   < > 16*  < > 17* 18* 22  --  27  GLUCOSE  --   < > 149*  < > 159* 169* 137*  --  186*  BUN  --   < > 65*  < > 77* 71* 69* 76* 78*  CREATININE  --   < > 3.44*  < > 3.59* 3.25* 3.03* 3.1* 2.44*  CALCIUM  --   < > 8.0*  < > 8.4* 8.2* 8.4*  --  8.5*  MG 1.9  --   --   --   --   --   --   --   --   PHOS  --   < > 4.5  --  4.6  --  3.9  --   --   < > = values in this interval not displayed. Liver Function Tests:  Recent Labs  06/20/15 1233 08/01/15 1620  08/09/15 1136 08/12/15 0502 08/18/15 1708  AST 15 12*  --   --   --  18  ALT 14 9*  --   --   --  10*  ALKPHOS 105 109  --   --   --  101  BILITOT 0.5 0.8  --   --   --  0.9  PROT 6.3 6.1*  --   --   --  6.0*  ALBUMIN 3.3* 2.7*  < > 2.3* 1.9* 2.7*  < > = values in this interval not displayed.   CBC:  Recent Labs  08/02/15 0331  08/11/15 0454 08/12/15 0502 08/15/15 08/18/15 1708  WBC 17.4*  < > 16.8* 14.0* 16.7 14.7*  NEUTROABS 14.9*  --   --  12.1*  --  13.2*  HGB 9.1*  < > 9.7* 9.7* 9.6* 10.7*  HCT 28.9*  < > 29.5* 30.2* 31* 33.7*  MCV 89.8  < > 87.0 86.8  --  89.2  PLT 195  < > 369 328 273 243  < > = values in this interval not displayed.  This SmartLink has not been configured with any valid records.   Lab Results  Component Value Date   HGBA1C 9.3* 08/01/2015   Lab Results  Component Value Date   TSH 1.24 06/04/2015     Lipid Panel:  Recent Labs  12/04/14 1353 02/21/15 1004 06/04/15 1326  HDL 34.90* 31* 34.10*   Cardiac Enzymes:  Recent Labs  08/01/15 2154 08/02/15 0331 08/02/15 1052  08/05/15 1656 08/08/15 0444 08/09/15 0324  CKTOTAL  --   --   --  449* 66 67  TROPONINI 0.07* 0.06* 0.09*  --   --   --    CBG:  Recent Labs  08/13/15 0733 08/13/15 1133 08/13/15 1642  GLUCAP 140* 154* 128*      Dg Chest 2 View  08/18/2015  CLINICAL DATA:  Cough, COPD EXAM: CHEST  2 VIEW COMPARISON:  08/09/2015 FINDINGS: Cardiomegaly again noted. No pulmonary edema. Central vascular congestion. Small bilateral pleural effusion with hazy bilateral basilar atelectasis or infiltrate. IMPRESSION: Cardiomegaly. Central vascular congestion without pulmonary edema. Small bilateral pleural effusion with bilateral basilar atelectasis or infiltrate. Electronically Signed   By: Lahoma Crocker M.D.   On: 08/18/2015 17:36   Dg Chest 2 View  08/09/2015  CLINICAL DATA:  Shortness of breath. EXAM: CHEST  2 VIEW COMPARISON:  08/08/2015. FINDINGS: Mediastinum and hilar structures normal. Cardiomegaly again noted. Interim partial clearing of bilateral pulmonary infiltrates/edema. Small bilateral pleural effusions again noted. No pneumothorax . IMPRESSION: Congestive heart failure with interim partial clearing of pulmonary edema. Small bilateral pleural effusions. Electronically Signed   By: Marcello Moores  Register   On: 08/09/2015 08:18   Dg Chest 2 View  08/01/2015  CLINICAL DATA:  Chest pains and cough. Sore throat. History of esophageal cancer. EXAM: CHEST  2 VIEW COMPARISON:  07/26/2015 FINDINGS: Lungs are clear bilaterally. Retrocardiac density is compatible with history of esophageal cancer and previous gastric pull-through procedure. There is a nodular density in the right lower chest which was present on the exam from 09/21/2014 and may be related to a nipple shadow. Atherosclerotic calcifications in the thoracic aorta. IMPRESSION: No acute chest findings. Electronically Signed   By: Markus Daft M.D.   On: 08/01/2015 16:04   Ct Chest Wo Contrast  08/04/2015  CLINICAL DATA:  Dyspnea. EXAM: CT CHEST WITHOUT  CONTRAST TECHNIQUE: Multidetector CT imaging of the chest was performed following the standard protocol without IV contrast. COMPARISON:  Chest radiograph of August 01, 2015. CT scan of November 06, 2004. FINDINGS: No pneumothorax is noted. Minimal bilateral pleural effusions are noted with adjacent subsegmental atelectasis. Atherosclerosis of thoracic aorta is noted without aneurysm. Mild pericardial effusion is noted. Aberrant right subclavian artery is noted. Visualized portion of upper abdomen is unremarkable. Aortic valve calcifications are noted. Coronary artery calcifications are noted as well. No mediastinal adenopathy is noted. The esophagus is dilated and full of debris concerning for distal obstruction. The patient is status post esophageal surgery. IMPRESSION: Minimal bilateral posterior basilar pleural effusions are noted with adjacent subsegmental atelectasis. Atherosclerosis thoracic aorta is noted without aneurysm formation. Mild pericardial effusion is noted. Aortic valve calcifications are noted. Coronary artery calcifications are noted suggesting coronary artery disease. Dilated esophagus is noted full of debris concerning for distal obstruction. The patient is status post esophageal surgery. Endoscopy is recommended for further evaluation. Electronically Signed   By: Marijo Conception, M.D.   On: 08/04/2015 15:08   US Renal  08/03/2015  CLINICAL DATA:  80 year old male with renal failure. EXAM: RENAL / URINARY TRACT ULTRASOUND COMPLETE COMPARISON:  Noncontrast CT 03/08/2015 FINDINGS: Right Kidney: Length: 11.4 cm. Echogenicity within normal limits. Mild thinning of renal parenchyma. No mass or hydronephrosis visualized. Lower pole calculi on prior CT are not visualized sonographically. Left Kidney: Length: 9.8 cm. Echogenicity within normal limits. There is thinning of renal parenchyma. No mass or hydronephrosis visualized. The cyst in the upper left kidney on prior CT is not visualized  sonographically. Bladder: Decompressed by Foley catheter and not well evaluated. IMPRESSION: No obstructive uropathy. There  is thinning of both renal parenchyma consistent with chronic medical renal disease. Electronically Signed   By: Jeb Levering M.D.   On: 08/03/2015 19:15   Nm Pulmonary Perf And Vent  08/10/2015  CLINICAL DATA:  Shortness of breath and chest pain EXAM: NUCLEAR MEDICINE VENTILATION - PERFUSION LUNG SCAN Views: Anterior, posterior, LPO, RPO, LAO, RAO -ventilation and perfusion RADIOPHARMACEUTICALS:  [id number] millicurie mCi [id number] DTPA aerosol inhalation and 4.3 millicurie mCi [id number] MAA IV COMPARISON:  Chest radiograph Aug 09, 2015 FINDINGS: Ventilation: There are scattered small nonsegmental ventilation defects bilaterally. Perfusion: There are a few nonsegmental perfusion defects which match ventilation defects. There is no appreciable ventilation/perfusion mismatch. There is no segmental level perfusion defect. Cardiomegaly is noted. IMPRESSION: No appreciable ventilation/ perfusion mismatch. No segmental sized perfusion defects. This study constitutes a low probability of pulmonary embolus. There is cardiomegaly. Electronically Signed   By: Lowella Grip III M.D.   On: 08/10/2015 15:16   Dg Chest Port 1 View  08/08/2015  CLINICAL DATA:  Shortness of breath EXAM: PORTABLE CHEST 1 VIEW COMPARISON:  08/01/2015 FINDINGS: Cardiac enlargement is identified. Increase in bilateral pleural effusions with progressive pulmonary edema. Aortic atherosclerosis noted. No airspace consolidation identified. IMPRESSION: 1. Cardiac enlargement and CHF. Electronically Signed   By: Kerby Moors M.D.   On: 08/08/2015 07:24   Dg Abd 2 Views  08/02/2015  CLINICAL DATA:  Vomiting x 1 hour. Pt denies any abdominal pain. Recent dx last week of PNA. Hx multiple abdominal conditions and surgeries. EXAM: ABDOMEN - 2 VIEW COMPARISON:  CT, 03/08/2015 FINDINGS: Normal bowel gas pattern.  No free  air. There is a vague calcification adjacent to the L3 spinous process measuring approximately 16 mm greatest dimension. This represents a small nonspecific calcified mesenteric lesion on the prior CT, likely an area remote fat necrosis. No evidence of renal or ureteral stones. IMPRESSION: 1. No acute findings.  No evidence of obstruction or free air. Electronically Signed   By: Lajean Manes M.D.   On: 08/02/2015 19:40    ASSESSMENT/PLAN:  UTI - discontinue Rocephin IV, start Rocephin 1 gm IM daily X 7 days  Nausea - start Phenergan 25 mg suppository 1 rectally Q 8 hours PRN and Zofran 4 mg 1 tab Q 8 hours PRN    Durenda Age, NP [employer] [phone]

## 2015-08-21 ENCOUNTER — Telehealth (HOSPITAL_BASED_OUTPATIENT_CLINIC_OR_DEPARTMENT_OTHER): Payer: Self-pay | Admitting: Emergency Medicine

## 2015-08-21 NOTE — Telephone Encounter (Signed)
Post ED Visit - Positive Culture Follow-up  Culture report reviewed by antimicrobial stewardship pharmacist:  []  Elenor Quinones, Pharm.D. []  Heide Guile, Pharm.D., BCPS []  Parks Neptune, Pharm.D. []  Alycia Rossetti, Pharm.D., BCPS []  [place], [place].D., BCPS, AAHIVP []  Legrand Como, Pharm.D., BCPS, AAHIVP [x]  Milus Glazier, Pharm.D. []  Stephens November, Pharm.D.  Positive urine culture Treated with ceftriaxone, organism sensitive to the same and no further patient follow-up is required at this time.  Hazle Nordmann 08/21/2015, 10:13 AM

## 2015-08-22 ENCOUNTER — Ambulatory Visit: Payer: 59 | Admitting: Psychology

## 2015-08-23 LAB — BASIC METABOLIC PANEL
BUN: 46 mg/dL — AB (ref 4–21)
CREATININE: 2 mg/dL — AB (ref 0.6–1.3)
GLUCOSE: 139 mg/dL
POTASSIUM: 4 mmol/L (ref 3.4–5.3)
SODIUM: 148 mmol/L — AB (ref 137–147)

## 2015-08-27 LAB — BASIC METABOLIC PANEL
BUN: 34 mg/dL — AB (ref 4–21)
Creatinine: 1.9 mg/dL — AB (ref 0.6–1.3)
Glucose: 143 mg/dL
Potassium: 4.2 mmol/L (ref 3.4–5.3)
SODIUM: 142 mmol/L (ref 137–147)

## 2015-08-30 ENCOUNTER — Non-Acute Institutional Stay (SKILLED_NURSING_FACILITY): Payer: Medicare Other | Admitting: Adult Health

## 2015-08-30 ENCOUNTER — Ambulatory Visit: Payer: Self-pay | Admitting: Endocrinology

## 2015-08-30 ENCOUNTER — Telehealth: Payer: Self-pay | Admitting: Physician Assistant

## 2015-08-30 ENCOUNTER — Encounter: Payer: Self-pay | Admitting: Adult Health

## 2015-08-30 DIAGNOSIS — I480 Paroxysmal atrial fibrillation: Secondary | ICD-10-CM | POA: Diagnosis not present

## 2015-08-30 DIAGNOSIS — F331 Major depressive disorder, recurrent, moderate: Secondary | ICD-10-CM

## 2015-08-30 DIAGNOSIS — E785 Hyperlipidemia, unspecified: Secondary | ICD-10-CM

## 2015-08-30 DIAGNOSIS — J449 Chronic obstructive pulmonary disease, unspecified: Secondary | ICD-10-CM | POA: Diagnosis not present

## 2015-08-30 DIAGNOSIS — R531 Weakness: Secondary | ICD-10-CM | POA: Diagnosis not present

## 2015-08-30 DIAGNOSIS — D638 Anemia in other chronic diseases classified elsewhere: Secondary | ICD-10-CM | POA: Diagnosis not present

## 2015-08-30 DIAGNOSIS — J9611 Chronic respiratory failure with hypoxia: Secondary | ICD-10-CM | POA: Diagnosis not present

## 2015-08-30 DIAGNOSIS — K5901 Slow transit constipation: Secondary | ICD-10-CM

## 2015-08-30 DIAGNOSIS — I5033 Acute on chronic diastolic (congestive) heart failure: Secondary | ICD-10-CM | POA: Diagnosis not present

## 2015-08-30 DIAGNOSIS — K279 Peptic ulcer, site unspecified, unspecified as acute or chronic, without hemorrhage or perforation: Secondary | ICD-10-CM | POA: Diagnosis not present

## 2015-08-30 DIAGNOSIS — R079 Chest pain, unspecified: Secondary | ICD-10-CM

## 2015-08-30 DIAGNOSIS — E039 Hypothyroidism, unspecified: Secondary | ICD-10-CM

## 2015-08-30 DIAGNOSIS — E1165 Type 2 diabetes mellitus with hyperglycemia: Secondary | ICD-10-CM

## 2015-08-30 DIAGNOSIS — E43 Unspecified severe protein-calorie malnutrition: Secondary | ICD-10-CM

## 2015-08-30 DIAGNOSIS — J189 Pneumonia, unspecified organism: Secondary | ICD-10-CM

## 2015-08-30 DIAGNOSIS — E1121 Type 2 diabetes mellitus with diabetic nephropathy: Secondary | ICD-10-CM

## 2015-08-30 DIAGNOSIS — IMO0002 Reserved for concepts with insufficient information to code with codable children: Secondary | ICD-10-CM

## 2015-08-30 DIAGNOSIS — R630 Anorexia: Secondary | ICD-10-CM

## 2015-08-30 DIAGNOSIS — Z794 Long term (current) use of insulin: Secondary | ICD-10-CM

## 2015-08-30 DIAGNOSIS — J309 Allergic rhinitis, unspecified: Secondary | ICD-10-CM

## 2015-08-30 NOTE — Telephone Encounter (Signed)
Caller name: Ardyth Gal Relation to pt: Sister  Call back number: [phone] Pharmacy:  Reason for call: Pt's sister came in office stating that pt was in the hospital, then when release was sent to rehab home, from there then decided to sent pt to assistant care, since pt was taking away his oxygen, pt is needing to have a new prescription for concentrator ASAP. Pt states is needing it for tomorrow if possible. Please advise.

## 2015-08-30 NOTE — Progress Notes (Signed)
Patient ID: Ricky Baker, male   DOB: 03/05/1936, 80 y.o.   MRN: [id number]     DATE:    08/30/15   MRN:  [id number]  BIRTHDAY: [birth year]  Facility:  Nursing Home Location:  [place] Room Number: 1206-P  LEVEL OF CARE:  SNF (31)  Contact Information    Name Relation Home Work Mobile   [place] Sister [phone]  [place] [phone]  [phone]       Code Status History    Date Active Date Inactive Code Status Order ID Comments User Context   08/12/2015  1:07 PM 08/13/2015  8:53 PM DNR [id number]  Elwin Mocha, MD Inpatient   08/01/2015  9:44 PM 08/12/2015  1:07 PM Full Code [id number]  Vianne Bulls, MD Inpatient    Questions for Most Recent Historical Code Status (Order [id number])    Question Answer Comment   In the event of cardiac or respiratory ARREST Do not call a "code blue"    In the event of cardiac or respiratory ARREST Do not perform Intubation, CPR, defibrillation or ACLS    In the event of cardiac or respiratory ARREST Use medication by any route, position, wound care, and other measures to relive pain and suffering. May use oxygen, suction and manual treatment of airway obstruction as needed for comfort.     Advance Directive Documentation        Most Recent Value   Type of Advance Directive  Out of facility DNR (pink MOST or yellow form)   Pre-existing out of facility DNR order (yellow form or pink MOST form)     "MOST" Form in Place?         Chief Complaint  Patient presents with  . Discharge Note    HISTORY OF PRESENT ILLNESS:  This is a 80 year old male who is for discharge to [place] with Home health PT, OT, CNA and Nursing. DME:  O2 @ 2L/min continuously via [place] portable (gas) and stationary).   He has been admitted to [facility] on 08/13/15 from [facility]. He has PMH BPH with urinary retention, PAF on Eliquis, chronic diastolic CHF, COPD  with home O2 requirement, CKD stage III, hypothyroidism, depression and esophageal cancer S/P resection in 2006. He was recently treated by his PCP for PNA with a course of Augmentin and Prednisone. Fever and dyspnea were resolved but continued to have chest pain. He went to [facility]. CXR showed resolution of prior infiltrate, wbc 21.4 and hgb 10.2 . Troponin was 0.06 and BNP 483, elevated. NA 128 and glucose 504. He was given 12 units SQ Novolog and was bolused with NS 1.5L  . He was then transferred to [facility], slightly elevated troponin had a flat trend and pleuritic chest pain was thought to be musculoskeletal secondary to pneumonia. EKG showed no ischemia.He had progressive decline and more confusion while in the hospital. Gabapentin was held and had better mental status.  Latest creatinine is 3.03 with baseline 2.8. He has CKD stage III and palliative consulted in hospital. He is not interested in HD according to Palliative notes. He has received 2 units of PRBC while in the hospital. Latest hgb 9.7. He has a history of PUD and gastritis evident on EGD on 2/17. Given his risk of recurrent bleed and CKD stage IV, his Eliquis was lowered to 2.5 mg BID.  Patient was admitted to this facility for short-term rehabilitation after the patient's recent hospitalization.  Patient has completed SNF rehabilitation and therapy has cleared the patient for discharge.   PAST MEDICAL HISTORY:  Past Medical History  Diagnosis Date  . COPD (chronic obstructive pulmonary disease) ([place])   . CHF (congestive heart failure) ([place])   . Thyroid disease   . Chronic bronchitis ([place])   . Atrial fibrillation ([place])   . CKD (chronic kidney disease)   . Diabetes mellitus without complication ([place])   . Allergy   . Esophageal cancer (H[facility])     esophogeal   . CKD (chronic kidney disease)   . Depression   . Hypertension   . Colon polyps   . CAD (coronary artery disease)    . Hyperlipidemia   . GERD (gastroesophageal reflux disease)   . History of renal stone   . Stroke [facility])     ? TIA per pt  . Shortness of breath dyspnea     with exertion  . Pneumonia   . Dyslexia   . Enlarged prostate   . Headache     hx of - none in years  . Anemia   . Loose stools   . Flatulence/gas pain/belching   . Pleuritic chest pain   . Acquired autoimmune hypothyroidism   . Leukocytosis      CURRENT MEDICATIONS: Reviewed  Patient's Medications  New Prescriptions   No medications on file  Previous Medications   ACCU-CHEK AVIVA PLUS TEST STRIP    TEST AS DIRECTED THREE TIMES DAILY   ACETAMINOPHEN (TYLENOL) 325 MG TABLET    Take 2 tablets (650 mg total) by mouth every 4 (four) hours as needed for headache or mild pain.   AMIODARONE (PACERONE) 200 MG TABLET    Take 1 tablet (200 mg total) by mouth daily.   APIXABAN (ELIQUIS) 2.5 MG TABS TABLET    Take 1 tablet (2.5 mg total) by mouth 2 (two) times daily.   BUDESONIDE-FORMOTEROL (SYMBICORT) 160-4.5 MCG/ACT INHALER    Inhale 2 puffs into the lungs. Twice daily   BUPROPION (WELLBUTRIN XL) 300 MG 24 HR TABLET    TAKE 1 TABLET(300 MG) BY MOUTH DAILY   CEFTRIAXONE (ROCEPHIN) 1 G INJECTION    Inject 1 g into the muscle daily. Take for 7 days   CHOLECALCIFEROL (VITAMIN D3) 50000 UNITS CAPS    Take 50,000 Units by mouth every 30 (thirty) days.   DOXERCALCIFEROL (HECTOROL) 2.5 MCG CAPSULE    TABLET 1 CAPSULE BY MOUTH DAILY   DULOXETINE (CYMBALTA) 60 MG CAPSULE    Take 1 capsule (60 mg total) by mouth daily.   FLUTICASONE (FLONASE) 50 MCG/ACT NASAL SPRAY    Place 2 sprays into both nostrils daily.   FUROSEMIDE (LASIX) 80 MG TABLET    Take 1 tablet (80 mg total) by mouth daily.   INSULIN LISPRO (HUMALOG) 100 UNIT/ML INJECTION    Inject 0-15 Units into the skin 3 (three) times daily with meals. Sliding scale: 70-120 = 0 units, 121-150 = 2 units, 151-200 = 3 units, 201-250 = 5 units, 251-300 = 8 units, 301-350 - 11 units, 351-400 15  units, >400 call md   INSULIN SYRINGE-NEEDLE U-100 (LITETOUCH INSULIN SYRINGE) 31G X 5/16" 0.3 ML MISC    Use as directed   IPRATROPIUM-ALBUTEROL (DUONEB) 0.5-2.5 (3) MG/3ML SOLN    Take 3 mLs by nebulization every 6 (six) hours as needed.   KETOCONAZOLE (NIZORAL) 2 % SHAMPOO    Apply 1  application topically 2 (two) times a week.   LEVOTHYROXINE (SYNTHROID, LEVOTHROID) 112 MCG TABLET    Take 112 mcg by mouth daily before breakfast.   MONTELUKAST (SINGULAIR) 10 MG TABLET    Take 10 mg by mouth at bedtime.   MULTIPLE VITAMINS-MINERALS (DECUBI-VITE) CAPS    Take 1 capsule by mouth daily.   MULTIPLE VITAMINS-MINERALS (ELDERTONIC PO)    Take by mouth. Elixir 15 ml twice daily for supplement   NEBIVOLOL (BYSTOLIC) 2.5 MG TABLET    Take 1 tablet (2.5 mg total) by mouth daily.   OLOPATADINE (PATANOL) 0.1 % OPHTHALMIC SOLUTION    Place 1 drop into both eyes 2 (two) times daily as needed.   ONDANSETRON (ZOFRAN) 4 MG TABLET    Take 1 tablet (4 mg total) by mouth every 8 (eight) hours as needed for nausea or vomiting.   OXYCODONE-ACETAMINOPHEN (PERCOCET/ROXICET) 5-325 MG TABLET    Take 1-2 tablets by mouth every 3 (three) hours as needed for moderate pain.   OXYGEN    Inhale 2 L/min into the lungs continuous.    PANTOPRAZOLE (PROTONIX) 40 MG TABLET    Take 1 tablet (40 mg total) by mouth 2 (two) times daily.   PROMETHAZINE (PHENERGAN) 25 MG SUPPOSITORY    Place 25 mg rectally every 8 (eight) hours as needed for nausea or vomiting.   PROTEIN (PROCEL 100) POWD    Take 2 scoop by mouth 2 (two) times daily.   RANOLAZINE (RANEXA) 500 MG 12 HR TABLET    Take 1 tablet (500 mg total) by mouth 2 (two) times daily.   ROFLUMILAST (DALIRESP) 500 MCG TABS TABLET    Take 500 mcg by mouth daily.   SENNA (SENOKOT) 8.6 MG TABS TABLET    Take 1 tablet (8.6 mg total) by mouth 2 (two) times daily.   SODIUM BICARBONATE 650 MG TABLET    Take 1 tablet (650 mg total) by mouth 2 (two) times daily.   TESTOSTERONE (ANDROGEL) 20.25  MG/1.25GM (1.62%) GEL    Apply 3 pumps on each arm daily (6) pumps total   TIOTROPIUM (SPIRIVA) 18 MCG INHALATION CAPSULE    Place 18 mcg into inhaler and inhale daily.   UNABLE TO FIND    Med Name: MedPass 120 mL po TID between meals for nutritional support  Modified Medications   No medications on file  Discontinued Medications   No medications on file     Allergies  Allergen Reactions  . Dust Mite Extract Other (See Comments)    Runny nose   . Pollen Extract Other (See Comments)    Runny nose    REVIEW OF SYSTEMS:  GENERAL:  no fever, chills ; +poor appetite EYES: Denies change in vision, dry eyes, eye pain, itching or discharge EARS: Denies change in hearing, ringing in ears, or earache NOSE: Denies nasal congestion or epistaxis MOUTH and THROAT: Denies oral discomfort, gingival pain or bleeding, pain from teeth or hoarseness   RESPIRATORY: no cough, SOB, DOE, wheezing, hemoptysis CARDIAC: no chest pain, edema or palpitations GI: no abdominal pain, diarrhea, constipation, heart burn, nausea or vomiting GU: Denies dysuria, frequency, hematuria, incontinence, or discharge PSYCHIATRIC: Denies feeling of anxiety. No report of hallucinations, insomnia, paranoia, or agitation, + depression   PHYSICAL EXAMINATION  GENERAL APPEARANCE: Well nourished. In no acute distress. Normal body habitus SKIN:  Has unstageable sacral pressure ulcer HEAD: Normal in size and contour. No evidence of trauma EYES: Lids open and close normally. No blepharitis, entropion or ectropion.  PERRL. Conjunctivae are clear and sclerae are white. Lenses are without opacity EARS: Pinnae are normal. Patient hears normal voice tunes of the examiner MOUTH and THROAT: Lips are without lesions. Oral mucosa is moist and without lesions. Tongue is normal in shape, size, and color and without lesions NECK: supple, trachea midline, no neck masses, no thyroid tenderness, no thyromegaly LYMPHATICS: no LAN in the neck,  no supraclavicular LAN RESPIRATORY: breathing is even & unlabored, BS CTAB, O2 @ 2L/min via [place] CARDIAC: RRR, no murmur,no extra heart sounds, no edema GI: abdomen soft, normal BS, no masses, no tenderness, no hepatomegaly, no splenomegaly GU:  Has FC EXTREMITIES:  Able to move X 4 extremities PSYCHIATRIC: Alert and oriented X 3. Affect and behavior are appropriate  LABS/RADIOLOGY: Labs reviewed: Basic Metabolic Panel:  Recent Labs  08/05/15 1656  08/07/15 0835  08/09/15 1136 08/10/15 0430 08/12/15 0502  08/18/15 1708 08/23/15 08/27/15  NA  --   < > 129*  < > 133* 135 138  < > 144 148* 142  K  --   < > 5.3*  < > 5.3* 4.3 4.1  < > 3.9 4.0 4.2  CL  --   < > 101  < > 102 103 102  --  104  --   --   CO2  --   < > 16*  < > 17* 18* 22  --  27  --   --   GLUCOSE  --   < > 149*  < > 159* 169* 137*  --  186*  --   --   BUN  --   < > 65*  < > 77* 71* 69*  < > 78* 46* 34*  CREATININE  --   < > 3.44*  < > 3.59* 3.25* 3.03*  < > 2.44* 2.0* 1.9*  CALCIUM  --   < > 8.0*  < > 8.4* 8.2* 8.4*  --  8.5*  --   --   MG 1.9  --   --   --   --   --   --   --   --   --   --   PHOS  --   < > 4.5  --  4.6  --  3.9  --   --   --   --   < > = values in this interval not displayed. Liver Function Tests:  Recent Labs  06/20/15 1233 08/01/15 1620  08/09/15 1136 08/12/15 0502 08/18/15 1708  AST 15 12*  --   --   --  18  ALT 14 9*  --   --   --  10*  ALKPHOS 105 109  --   --   --  101  BILITOT 0.5 0.8  --   --   --  0.9  PROT 6.3 6.1*  --   --   --  6.0*  ALBUMIN 3.3* 2.7*  < > 2.3* 1.9* 2.7*  < > = values in this interval not displayed.   CBC:  Recent Labs  08/02/15 0331  08/11/15 0454 08/12/15 0502 08/15/15 08/18/15 1708  WBC 17.4*  < > 16.8* 14.0* 16.7 14.7*  NEUTROABS 14.9*  --   --  12.1*  --  13.2*  HGB 9.1*  < > 9.7* 9.7* 9.6* 10.7*  HCT 28.9*  < > 29.5* 30.2* 31* 33.7*  MCV 89.8  < > 87.0 86.8  --  89.2  PLT 195  < >  369 328 273 243  < > = values in this interval not  displayed.  This SmartLink has not been configured with any valid records.   Lab Results  Component Value Date   HGBA1C 9.3* 08/01/2015   Lab Results  Component Value Date   TSH 1.24 06/04/2015     Lipid Panel:  Recent Labs  12/04/14 1353 02/21/15 1004 06/04/15 1326  HDL 34.90* 31* 34.10*   Cardiac Enzymes:  Recent Labs  08/01/15 2154 08/02/15 0331 08/02/15 1052 08/05/15 1656 08/08/15 0444 08/09/15 0324  CKTOTAL  --   --   --  449* 66 67  TROPONINI 0.07* 0.06* 0.09*  --   --   --    CBG:  Recent Labs  08/13/15 0733 08/13/15 1133 08/13/15 1642  GLUCAP 140* 154* 128*      Dg Chest 2 View  08/18/2015  CLINICAL DATA:  Cough, COPD EXAM: CHEST  2 VIEW COMPARISON:  08/09/2015 FINDINGS: Cardiomegaly again noted. No pulmonary edema. Central vascular congestion. Small bilateral pleural effusion with hazy bilateral basilar atelectasis or infiltrate. IMPRESSION: Cardiomegaly. Central vascular congestion without pulmonary edema. Small bilateral pleural effusion with bilateral basilar atelectasis or infiltrate. Electronically Signed   By: Lahoma Crocker M.D.   On: 08/18/2015 17:36   Dg Chest 2 View  08/09/2015  CLINICAL DATA:  Shortness of breath. EXAM: CHEST  2 VIEW COMPARISON:  08/08/2015. FINDINGS: Mediastinum and hilar structures normal. Cardiomegaly again noted. Interim partial clearing of bilateral pulmonary infiltrates/edema. Small bilateral pleural effusions again noted. No pneumothorax . IMPRESSION: Congestive heart failure with interim partial clearing of pulmonary edema. Small bilateral pleural effusions. Electronically Signed   By: [place]   On: 08/09/2015 08:18   Ct Chest Wo Contrast  08/04/2015  CLINICAL DATA:  Dyspnea. EXAM: CT CHEST WITHOUT CONTRAST TECHNIQUE: Multidetector CT imaging of the chest was performed following the standard protocol without IV contrast. COMPARISON:  Chest radiograph of August 01, 2015. CT scan of November 06, 2004. FINDINGS: No  pneumothorax is noted. Minimal bilateral pleural effusions are noted with adjacent subsegmental atelectasis. Atherosclerosis of thoracic aorta is noted without aneurysm. Mild pericardial effusion is noted. Aberrant right subclavian artery is noted. Visualized portion of upper abdomen is unremarkable. Aortic valve calcifications are noted. Coronary artery calcifications are noted as well. No mediastinal adenopathy is noted. The esophagus is dilated and full of debris concerning for distal obstruction. The patient is status post esophageal surgery. IMPRESSION: Minimal bilateral posterior basilar pleural effusions are noted with adjacent subsegmental atelectasis. Atherosclerosis thoracic aorta is noted without aneurysm formation. Mild pericardial effusion is noted. Aortic valve calcifications are noted. Coronary artery calcifications are noted suggesting coronary artery disease. Dilated esophagus is noted full of debris concerning for distal obstruction. The patient is status post esophageal surgery. Endoscopy is recommended for further evaluation. Electronically Signed   By: Marijo Conception, M.D.   On: 08/04/2015 15:08   US Renal  08/03/2015  CLINICAL DATA:  80 year old male with renal failure. EXAM: RENAL / URINARY TRACT ULTRASOUND COMPLETE COMPARISON:  Noncontrast CT 03/08/2015 FINDINGS: Right Kidney: Length: 11.4 cm. Echogenicity within normal limits. Mild thinning of renal parenchyma. No mass or hydronephrosis visualized. Lower pole calculi on prior CT are not visualized sonographically. Left Kidney: Length: 9.8 cm. Echogenicity within normal limits. There is thinning of renal parenchyma. No mass or hydronephrosis visualized. The cyst in the upper left kidney on prior CT is not visualized sonographically. Bladder: Decompressed by Foley catheter and not well evaluated. IMPRESSION: No  obstructive uropathy. There is thinning of both renal parenchyma consistent with chronic medical renal disease. Electronically  Signed   By: Jeb Levering M.D.   On: 08/03/2015 19:15   Nm Pulmonary Perf And Vent  08/10/2015  CLINICAL DATA:  Shortness of breath and chest pain EXAM: NUCLEAR MEDICINE VENTILATION - PERFUSION LUNG SCAN Views: Anterior, posterior, LPO, RPO, LAO, RAO -ventilation and perfusion RADIOPHARMACEUTICALS:  [id number] millicurie mCi [id number] DTPA aerosol inhalation and 4.3 millicurie mCi [id number] MAA IV COMPARISON:  Chest radiograph Aug 09, 2015 FINDINGS: Ventilation: There are scattered small nonsegmental ventilation defects bilaterally. Perfusion: There are a few nonsegmental perfusion defects which match ventilation defects. There is no appreciable ventilation/perfusion mismatch. There is no segmental level perfusion defect. Cardiomegaly is noted. IMPRESSION: No appreciable ventilation/ perfusion mismatch. No segmental sized perfusion defects. This study constitutes a low probability of pulmonary embolus. There is cardiomegaly. Electronically Signed   By: Lowella Grip III M.D.   On: 08/10/2015 15:16   Dg Chest Port 1 View  08/08/2015  CLINICAL DATA:  Shortness of breath EXAM: PORTABLE CHEST 1 VIEW COMPARISON:  08/01/2015 FINDINGS: Cardiac enlargement is identified. Increase in bilateral pleural effusions with progressive pulmonary edema. Aortic atherosclerosis noted. No airspace consolidation identified. IMPRESSION: 1. Cardiac enlargement and CHF. Electronically Signed   By: Kerby Moors M.D.   On: 08/08/2015 07:24    ASSESSMENT/PLAN:  Generalized weakness  - for Home health PT, OT, CNA and Nursing; fall precaution  Chronic respiratory distress with hypoxia -  Has COPD and pulmonary vascular congestion; was given IVF and bicarb in hospital; continue O2 at 2 L/minute via [place]; continue Lasix 80 mg daily, DuoNeb and Singulair  HCAP - recently finished Rocephin 1 gm IM Q D X 7 days; no fever  Acute on chronic diastolic CHF - EF 0000000 continue Lasix 80 mg daily  Paroxysmal atrial fibrillation  - rate controlled; recently decreased Eliquis to 2.5 mg BID; continue Amiodarone and nebivolol  Type 2 diabetes mellitus with nephropathy - hemoglobin A1c 9.3; continue NovoLog sliding scale 3 times a day  Anemia of chronic disease - S/Ptransfusion of  2 Units packed RBC; stable Lab Results  Component Value Date   HGB 9.9 (L) 09/05/2015    Chronic kidney disease, stage III -  Latest creatinine 1.9 ; baseline creatinine 2.8; according to Palliative Care consult note, patient is not interested in HD; continue sodium bicarbonate 650 mg 1 tab by mouth twice a day, Hectorol 2.5 mcg 1 tab daily, Bystolic 2.5 mg daily   Hyperlipidemia - continue Lipitor 80 mg 1 tab by mouth daily at bedtime Lab Results  Component Value Date   CHOL 165 06/04/2015   HDL 34.10* 06/04/2015   LDLCALC 107 02/21/2015   LDLDIRECT 97.0 06/04/2015   TRIG 266.0* 06/04/2015   CHOLHDL 5 06/04/2015   Hypothyroidism - continue Synthroid 112 g 1 tab by mouth daily Lab Results  Component Value Date   TSH 1.24 06/04/2015   PUD - Continue Protonix 40 mg 1 tab by mouth twice a day  Depression - continue Cymbalta 60 mg 1 capsule daily and Wellbutrin XL 300 mg 1 tab by mouth daily; sister will schedule appointment with his psychiatrist  COPD - continue Dalresp 500 g 1 tab by mouth daily, Spiriva 18 g daily, Symbicort 2  Puffs BID and Duoneb PRN  Allergic rhinitis - continue Flonase nasal spray daily  Chest pain - continue Ranexa ER 500 mg 1 tab BID  Constipation - continue Senokot 8.6 mg  1 tab BID  Poor appetite - continue Eldertonic 15 ml BID  Protein calorie malnutrition, severe - albumin 1.9; continue Procel 2 scoops by mouth twice a day     I have filled out patient's discharge paperwork and written prescriptions.  Patient will receive home health PT, OT, Nursing and CNA.  DME provided:  O2 @ 2L/min continuously via [place] portable (gas) and stationary)  Total discharge time: Greater than 30 minutes Greater  than 50% was spent in counseling and coordination of care with the patient.  Discharge time involved coordination of the discharge process with social worker, nursing staff and therapy department. Medical justification for home health services/DME verified.    Durenda Age, NP [employer] [phone]

## 2015-08-31 NOTE — Telephone Encounter (Signed)
Attempted to call [facility] to verify pt's discharge plan. Was transferred to nursing staff, who stated, "Let me transfer you to social work. This has been going on." Transferred to social worker's voicemail, unable to leave message for return call as was cut off my answering machine.

## 2015-08-31 NOTE — Telephone Encounter (Signed)
Please check on this. If he is still in SNF then they will need to be writing all of these orders. If he has already been discharged to [facility] place then ok to place order for Urology referral but need the reasoning.

## 2015-08-31 NOTE — Telephone Encounter (Signed)
Please call sister to assess further. Is he still in the nursing facility. He was last under care of Blanchie Serve with Geriatric medicine after discharge. Is he no longer being cared for by this provider?

## 2015-08-31 NOTE — Telephone Encounter (Signed)
Returned call to Ricky Baker, pt's sister.   Pt is  being transferred to assisted living facility today. Plan for discharge after 3pm. Originally when he went into rehab, the plan was that he would d/c to long term care, so Ricky Baker returned his O2 concentrator to [place]. However, the pt has done very well, so they have decided to d/c to assisted living, so [facility] has to provide O2 for him. Ricky Baker thought the prior prescription for O2 was still good, but has since found out from [facility] that they will need a new rx and new saturation qualification. Explained to Ricky Baker that since he is still in SNF and still requiring 2L continuous O2, SNF should be writing discharge orders for home O2 and doing sat qualifications if needed. She stated she would call them right now, but has been very disappointed and frustrated with the care team there, as she feels that it has taken too long for decisions to be made and to get orders signed. They will be transferring back to [name]'s care after discharge, and plan to follow up in office next week, but declined to schedule appt at time of call.

## 2015-08-31 NOTE — Telephone Encounter (Signed)
Ricky Baker sister came in office wanting to inform that from the facility [facility]) is wanting pt to see a Dealer. Please advise.

## 2015-08-31 NOTE — Telephone Encounter (Signed)
Returned call to [employer]. She states that pt is being discharged to [facility] w/ indwelling urinary catheter and "no one has checked to see if it can come out," so [place] told her to have him see a urologist. Advised that SNF would need to write orders, as they are discharging him. She voiced understanding. She has scheduled appt w/ [name] next Friday at 1:30pm.

## 2015-08-31 NOTE — Telephone Encounter (Signed)
Thank you. We will await her return call after speaking with the SNF.

## 2015-08-31 NOTE — Telephone Encounter (Signed)
Please advise 

## 2015-09-04 NOTE — Telephone Encounter (Signed)
Can we follow-up on this with sister or SNF. He has appointment Friday but would like this hashed out before then.

## 2015-09-04 NOTE — Telephone Encounter (Signed)
Again he has not been in my care and I have not been following or manage care. I am ok considering the Tylenol after I evaluate him tomorrow.

## 2015-09-04 NOTE — Telephone Encounter (Addendum)
Called Ricky Baker. Pt is still in [facility], plan for discharge tomorrow. He is receiving palliative care services. He still has indwelling urinary catheter, and is having some pink-tinged urine. She has not been to see him in a little while so she is unsure if it has continued today. This previously happened while pt was in SNF, and then resolved on its own prior to discharge. Started back Sunday night. Pt is not passing clots and has no overt bleeding, no abd pain or fever, but Ricky Baker reports he "feels yucky." OV rescheduled for 09/05/15 @ 1:30pm w/ Elyn Aquas, PA-C. Ricky Baker notified to take pt to ED or urgent care prior to appt if alarm symptoms and agreed to plan. She would like order for PRN 500 mg Tylenol PO to be sent to [facility] if appropriate.

## 2015-09-05 ENCOUNTER — Ambulatory Visit (INDEPENDENT_AMBULATORY_CARE_PROVIDER_SITE_OTHER): Payer: Medicare Other | Admitting: Physician Assistant

## 2015-09-05 ENCOUNTER — Encounter: Payer: Self-pay | Admitting: Physician Assistant

## 2015-09-05 VITALS — BP 100/42 | HR 91 | Temp 98.0°F | Resp 18 | Wt 200.2 lb

## 2015-09-05 DIAGNOSIS — D649 Anemia, unspecified: Secondary | ICD-10-CM

## 2015-09-05 DIAGNOSIS — E1121 Type 2 diabetes mellitus with diabetic nephropathy: Secondary | ICD-10-CM | POA: Diagnosis not present

## 2015-09-05 DIAGNOSIS — IMO0002 Reserved for concepts with insufficient information to code with codable children: Secondary | ICD-10-CM

## 2015-09-05 DIAGNOSIS — Z794 Long term (current) use of insulin: Secondary | ICD-10-CM

## 2015-09-05 DIAGNOSIS — F331 Major depressive disorder, recurrent, moderate: Secondary | ICD-10-CM

## 2015-09-05 DIAGNOSIS — D638 Anemia in other chronic diseases classified elsewhere: Secondary | ICD-10-CM

## 2015-09-05 DIAGNOSIS — Z960 Presence of urogenital implants: Secondary | ICD-10-CM

## 2015-09-05 DIAGNOSIS — J449 Chronic obstructive pulmonary disease, unspecified: Secondary | ICD-10-CM

## 2015-09-05 DIAGNOSIS — Z9289 Personal history of other medical treatment: Secondary | ICD-10-CM

## 2015-09-05 DIAGNOSIS — I480 Paroxysmal atrial fibrillation: Secondary | ICD-10-CM

## 2015-09-05 DIAGNOSIS — N289 Disorder of kidney and ureter, unspecified: Secondary | ICD-10-CM | POA: Diagnosis not present

## 2015-09-05 DIAGNOSIS — E1165 Type 2 diabetes mellitus with hyperglycemia: Secondary | ICD-10-CM

## 2015-09-05 DIAGNOSIS — Z978 Presence of other specified devices: Secondary | ICD-10-CM

## 2015-09-05 LAB — POCT URINALYSIS DIPSTICK
Bilirubin, UA: POSITIVE
Glucose, UA: NEGATIVE
Ketones, UA: POSITIVE
Nitrite, UA: NEGATIVE
Protein, UA: POSITIVE
UROBILINOGEN UA: 1
pH, UA: 5

## 2015-09-05 NOTE — Progress Notes (Signed)
Pre visit review using our clinic review tool, if applicable. No additional management support is needed unless otherwise documented below in the visit note/SLS  

## 2015-09-05 NOTE — Progress Notes (Addendum)
Patient presents to clinic today with his sister for follow-up after discharge from SNF to Assisted Living. Patient hospitalized on 08/01/15 after sent to ER from office by this provider for deteriorating condition and treatment failure for CAP initiated by another provider. Patient treated inpatient and discharged on 08/13/15 with the following diagnoses:   -Acquired autoimmune hypothyroidism  History of coronary artery disease  Major depressive disorder, recurrent episode, moderate (HCC)  Essential hypertension  PAF (paroxysmal atrial fibrillation) (HCC)  Type II diabetes mellitus, uncontrolled (HCC)  Chronic respiratory failure (HCC)  CKD (chronic kidney disease)  Leukocytosis  Hyperglycemia  Chronic renal insufficiency  Moderate aortic stenosis by prior echocardiogram  CAD S/P percutaneous coronary angioplasty  Chest pain  Acute on chronic diastolic CHF (congestive heart failure), NYHA class 1 ([place])  Dyspnea  Encounter for palliative care  Goals of care, counseling/discussion  Pericardial effusion  Elevated troponin   Was sent to [facility] place for skilled nursing. Patient was followed by Gerontology while in [place].  Was noted to be suitable for transfer to Assisted Living. Patient was transferred to [place]. Palliative care consult given and patient elected Palliative measures and comfort care. Sister denies Hospice Consult while in SNF.   Since discharge patient states he feels very fatigued. Endorses worsening depressed mood without SI/HI. Is still taking his Cymbalta and Wellbutrin. Is having anxiety in the night when he wakes up in the new environment. Notes agitation during these times. Is mostly staying in the bed. Is ambulating for bowel movements only. Foley catheter in place from SNF. No orders to discontinue at present. Patient denies chest pain, shortness of breath. Is taking chronic medications as directed with the exception of  Gabapentin which was stopped in the hospital. Endorses good urinary output in foley but sister noted urine is very dark. No blood work has been obtained since transfer to ALF as orders were needed. Sister also notes SNF intended for him to have PT/OT at ALF, but they did not set up for patient. Requesting PT to help with ambulation to hopefully get him more steady on his feet due to weakness. Patient agrees to this.   Sister and patient would like to discuss removal of unnecessary medications giving palliative care status. Patient is still being given his androgel and Lipitor. Patient questions necessity of medications.   Denies new concerns. States he just is fatigued and wants to rest.   Past Medical History  Diagnosis Date  . COPD (chronic obstructive pulmonary disease) ([place])   . CHF (congestive heart failure) ([place])   . Thyroid disease   . Chronic bronchitis ([place])   . Atrial fibrillation ([place])   . CKD (chronic kidney disease)   . Diabetes mellitus without complication ([place])   . Allergy   . Esophageal cancer (HCC)     esophogeal   . CKD (chronic kidney disease)   . Depression   . Hypertension   . Colon polyps   . CAD (coronary artery disease)   . Hyperlipidemia   . GERD (gastroesophageal reflux disease)   . History of renal stone   . Stroke [employer])     ? TIA per pt  . Shortness of breath dyspnea     with exertion  . Pneumonia   . Dyslexia   . Enlarged prostate   . Headache     hx of - none in years  . Anemia   . Loose stools   . Flatulence/gas pain/belching   . Pleuritic chest  pain   . Acquired autoimmune hypothyroidism   . Leukocytosis     Current Outpatient Prescriptions on File Prior to Visit  Medication Sig Dispense Refill  . ACCU-CHEK AVIVA PLUS test strip TEST AS DIRECTED THREE TIMES DAILY 100 each 5  . acetaminophen (TYLENOL) 325 MG tablet Take 2 tablets (650 mg total) by mouth every 4 (four) hours as needed for headache or mild pain. 30 tablet 0  . amiodarone  (PACERONE) 200 MG tablet Take 1 tablet (200 mg total) by mouth daily. 30 tablet 0  . apixaban (ELIQUIS) 2.5 MG TABS tablet Take 1 tablet (2.5 mg total) by mouth 2 (two) times daily. 60 tablet 0  . budesonide-formoterol (SYMBICORT) 160-4.5 MCG/ACT inhaler Inhale 2 puffs into the lungs. Twice daily    . buPROPion (WELLBUTRIN XL) 300 MG 24 hr tablet TAKE 1 TABLET(300 MG) BY MOUTH DAILY 90 tablet 1  . Cholecalciferol (VITAMIN D3) 50000 units CAPS Take 50,000 Units by mouth every 30 (thirty) days.    Marland Kitchen doxercalciferol (HECTOROL) 2.5 MCG capsule TABLET 1 CAPSULE BY MOUTH DAILY 90 capsule 0  . DULoxetine (CYMBALTA) 60 MG capsule Take 1 capsule (60 mg total) by mouth daily. 90 capsule 1  . fluticasone (FLONASE) 50 MCG/ACT nasal spray Place 2 sprays into both nostrils daily. 16 g 1  . furosemide (LASIX) 80 MG tablet Take 1 tablet (80 mg total) by mouth daily. 30 tablet 0  . insulin lispro (HUMALOG) 100 UNIT/ML injection Inject 0-15 Units into the skin 3 (three) times daily with meals. Sliding scale: 70-120 = 0 units, 121-150 = 2 units, 151-200 = 3 units, 201-250 = 5 units, 251-300 = 8 units, 301-350 - 11 units, 351-400 15 units, >400 call md    . Insulin Syringe-Needle U-100 (LITETOUCH INSULIN SYRINGE) 31G X 5/16" 0.3 ML MISC Use as directed 90 each 1  . ipratropium-albuterol (DUONEB) 0.5-2.5 (3) MG/3ML SOLN Take 3 mLs by nebulization every 6 (six) hours as needed. 360 mL 1  . levothyroxine (SYNTHROID, LEVOTHROID) 112 MCG tablet Take 112 mcg by mouth daily before breakfast.    . montelukast (SINGULAIR) 10 MG tablet Take 10 mg by mouth at bedtime.    . nebivolol (BYSTOLIC) 2.5 MG tablet Take 1 tablet (2.5 mg total) by mouth daily. 30 tablet 0  . olopatadine (PATANOL) 0.1 % ophthalmic solution Place 1 drop into both eyes 2 (two) times daily as needed. 5 mL 2  . ondansetron (ZOFRAN) 4 MG tablet Take 1 tablet (4 mg total) by mouth every 8 (eight) hours as needed for nausea or vomiting. 30 tablet 1  .  oxyCODONE-acetaminophen (PERCOCET/ROXICET) 5-325 MG tablet Take 1-2 tablets by mouth every 3 (three) hours as needed for moderate pain. 30 tablet 0  . OXYGEN Inhale 2 L/min into the lungs continuous.     . pantoprazole (PROTONIX) 40 MG tablet Take 1 tablet (40 mg total) by mouth 2 (two) times daily. 60 tablet 0  . promethazine (PHENERGAN) 25 MG suppository Place 25 mg rectally every 8 (eight) hours as needed for nausea or vomiting.    . roflumilast (DALIRESP) 500 MCG TABS tablet Take 500 mcg by mouth daily.    Marland Kitchen senna (SENOKOT) 8.6 MG TABS tablet Take 1 tablet (8.6 mg total) by mouth 2 (two) times daily. 120 each 0  . Testosterone (ANDROGEL) 20.25 MG/1.25GM (1.62%) GEL Apply 3 pumps on each arm daily (6) pumps total 2.5 g 3  . tiotropium (SPIRIVA) 18 MCG inhalation capsule Place 18 mcg into inhaler  and inhale daily.    Marland Kitchen ketoconazole (NIZORAL) 2 % shampoo Apply 1 application topically 2 (two) times a week. 120 mL 0  . Multiple Vitamins-Minerals (DECUBI-VITE) CAPS Take 1 capsule by mouth daily.    . Multiple Vitamins-Minerals (ELDERTONIC PO) Take by mouth. Elixir 15 ml twice daily for supplement    . ranolazine (RANEXA) 500 MG 12 hr tablet Take 1 tablet (500 mg total) by mouth 2 (two) times daily. 60 tablet 11  . sodium bicarbonate 650 MG tablet Take 1 tablet (650 mg total) by mouth 2 (two) times daily. 60 tablet 0  . UNABLE TO FIND Med Name: MedPass 120 mL po TID between meals for nutritional support     No current facility-administered medications on file prior to visit.    Allergies  Allergen Reactions  . Dust Mite Extract Other (See Comments)    Runny nose   . Pollen Extract Other (See Comments)    Runny nose    Family History  Problem Relation Age of Onset  . COPD Father   . Diabetes Neg Hx     Social History   Social History  . Marital Status: Single    Spouse Name: N/A  . Number of Children: N/A  . Years of Education: N/A   Occupational History  . retired Astronomer ed    Social History Main Topics  . Smoking status: Former Smoker -- 1.00 packs/day for 42 years    Types: Cigarettes    Quit date: 04/07/2001  . Smokeless tobacco: Never Used  . Alcohol Use: No  . Drug Use: No  . Sexual Activity: No   Other Topics Concern  . None   Social History Narrative   Ad[facility] to [facility]13/[facility]   Never married   Retired Psychologist, counselling Ed   Former Smoker -stopped 2003   Alcohol none   DNR   Review of Systems - See HPI.  All other ROS are negative.  BP 100/42 mmHg  Pulse 91  Temp(Src) 98 F (36.7 C) (Oral)  Resp 18  Wt 200 lb 3.2 oz (90.81 kg)  SpO2 98%  Physical Exam  Constitutional: He is oriented to person, place, and time and well-developed, well-nourished, and in no distress.  HENT:  Head: Normocephalic and atraumatic.  Eyes: Conjunctivae are normal. Pupils are equal, round, and reactive to light.  Neck: Neck supple. No thyromegaly present.  Cardiovascular: Normal rate, regular rhythm, normal heart sounds and intact distal pulses.   Pulmonary/Chest: Effort normal and breath sounds normal. No respiratory distress. He has no wheezes. He has no rales. He exhibits no tenderness.  Abdominal: Soft. Bowel sounds are normal. He exhibits no distension. There is no tenderness.  Genitourinary:  Foley with good output. Urine is mahogany in color.  Neurological: He is alert and oriented to person, place, and time. No cranial nerve deficit.  Strength of lower extremities -- hips, knees -- at 3-4/5 bilaterally.  Strength of upper extremities 5/5  Skin: Skin is warm and dry. No rash noted.  Psychiatric: Memory and judgment normal. He exhibits a depressed mood. He expresses no homicidal and no suicidal ideation. He expresses no suicidal plans and no homicidal plans. He exhibits ordered thought content. He has a flat affect.  Vitals reviewed.  Assessment/Plan: 1. Anemia of chronic disease Will check labs to assess for stability. Giving his  refusal for dialysis, this will only deteriorate as renal function declines. Sister notes blood in stool. Patient is on palliative measures  and they do not want to proceed with any in-depth testing. Will check CBC and BMP today to assess change from discharge labs. - CBC - Basic Metabolic Panel (BMET) - Ambulatory referral to Social Work  2. Foley catheter in place Foley draining well. Urine is very dark. No clotting noted. Will check urine studies today. Will leave Foley in place but order written for ALF to change Foley per their guidelines. - CULTURE, URINE COMPREHENSIVE - POCT urinalysis dipstick; Standing - POCT urinalysis dipstick - Ambulatory referral to Social Work  3. Renal insufficiency ESRD on admission, Much improved at discharge. Patient on supportive care. Denies dialysis. Will continue to decline. Medications adjusted for renal function. Have stopped Gabapentin. Will also stop lipitor and Androgel as they are not vital medications at present. - Ambulatory referral to Social Work  4. Uncontrolled type 2 diabetes mellitus with diabetic nephropathy, with long-term current use of insulin (HCC) Sugars stable in SNF. Orders written for glucose monitoring. Will continue insulin on sliding scale for now.  - Ambulatory referral to Social Work  5. Chronic obstructive pulmonary disease, unspecified COPD type ([place]) Stable at present on 2L 02. Continue chronic medications. - Ambulatory referral to Social Work  6. Major depressive disorder, recurrent episode, moderate (HCC) Deteriorated due to recent health. Will continue Wellbutrin and Cymbalta. Klonopin added for anxiety/agitation. Patient would like to continue counseling.  7. PAF (paroxysmal atrial fibrillation) (HCC) Rate controlled. Continue chronic medications including Eliquis.   Discussed with patient and family that ALF may not be sufficient for care. Patient is of sound mind and not wanting to go back to SNF. Palliative care  is involved, but feel patient needs Hospice. Referral placed. I do not feel he will survive long without dialysis and more acute care. Patient is accepting of this. Family is extremely supportive.   Leeanne Rio, PA-C

## 2015-09-06 ENCOUNTER — Telehealth: Payer: Self-pay | Admitting: Internal Medicine

## 2015-09-06 LAB — BASIC METABOLIC PANEL
BUN: 61 mg/dL — ABNORMAL HIGH (ref 6–23)
CHLORIDE: 93 meq/L — AB (ref 96–112)
CO2: 23 meq/L (ref 19–32)
CREATININE: 3.41 mg/dL — AB (ref 0.40–1.50)
Calcium: 8.5 mg/dL (ref 8.4–10.5)
GFR: 18.67 mL/min — ABNORMAL LOW (ref >60.00)
Glucose, Bld: 131 mg/dL — ABNORMAL HIGH (ref 70–99)
Potassium: 5.5 mEq/L — ABNORMAL HIGH (ref 3.5–5.1)
Sodium: 131 mEq/L — ABNORMAL LOW (ref 135–145)

## 2015-09-06 LAB — CBC
HCT: 30.7 % — ABNORMAL LOW (ref 39.0–52.0)
Hemoglobin: 9.9 g/dL — ABNORMAL LOW (ref 13.0–17.0)
MCHC: 32.3 g/dL (ref 30.0–36.0)
MCV: 86.4 fl (ref 78.0–100.0)
PLATELETS: 150 10*3/uL (ref 150.0–400.0)
RBC: 3.56 Mil/uL — AB (ref 4.22–5.81)
RDW: 16.4 % — ABNORMAL HIGH (ref 11.5–15.5)
WBC: 15 10*3/uL — AB (ref 4.0–10.5)

## 2015-09-06 NOTE — Telephone Encounter (Signed)
Caller name: [name]  Can be reached: [phone]    Reason for call: Need office notes and labs from visit with [facility] on 5/31 faxed to [phone]

## 2015-09-07 ENCOUNTER — Telehealth: Payer: Self-pay | Admitting: Physician Assistant

## 2015-09-07 ENCOUNTER — Ambulatory Visit: Payer: Self-pay | Admitting: Physician Assistant

## 2015-09-07 NOTE — Telephone Encounter (Signed)
Caller name: Caryl Pina with [facility] Can be reached: [phone]  Reason for call: Called to notify that pt will not be started for SN until tomorrow and did not get started w/in 48 hours.

## 2015-09-07 NOTE — Telephone Encounter (Signed)
Called and spoke with Vaughan Basta with [employer] to verify PT/OT/RN orders have been received and started.  Vaughan Basta stated that the orders have been received but have not been started yet.  Also informed her that they requested MSW,but the pt has already been seen by Palliative Care and he is now under Hospice.  She stated that she will let them know.  Verbally informed Einar Pheasant of the message about the PT/OT/RN.//AB/CMA

## 2015-09-07 NOTE — Telephone Encounter (Signed)
Called and [facility] for [name] with [facility] @ 8:23am @ [phone]) asking her to RTC regarding note below.//AB/CMA

## 2015-09-08 LAB — CULTURE, URINE COMPREHENSIVE: Colony Count: >=100000

## 2015-09-10 NOTE — Telephone Encounter (Signed)
Per Einar Pheasant pt deceased.//AB/CMA

## 2015-09-10 NOTE — Telephone Encounter (Signed)
Called and [facility] @ 10:44am @ [phone]) asking [name] with [facility] to RTC regarding message below.//AB/CMA

## 2015-09-11 ENCOUNTER — Telehealth: Payer: Self-pay | Admitting: Physician Assistant

## 2015-09-11 NOTE — Telephone Encounter (Signed)
Caller name: Ronalee Belts with [facility] Can be reached: [phone]  Reason for call: Pt passed away 09-27-15 and they will bring the death certificate that needs to be signed to our office.

## 2015-09-11 NOTE — Telephone Encounter (Signed)
Will sign and fax once received. Thank you for making me aware.

## 2015-09-11 NOTE — Telephone Encounter (Signed)
Certificate completed. Per Richrd Humbles instructions, copy faxed to them. Original in a green folder up front for pick up.

## 2015-09-11 NOTE — Telephone Encounter (Signed)
Noted, provider has been awaiting this; phoned [facility] and they will drop by tomorrow and leave fax number on envelope for cremation purposes and then we will call for p/u of hard copy/SLS 06/06

## 2015-10-06 DEATH — deceased

## 2015-10-12 ENCOUNTER — Ambulatory Visit: Payer: Self-pay | Admitting: Physician Assistant

## 2015-11-02 ENCOUNTER — Encounter: Payer: Self-pay | Admitting: *Deleted

## 2016-01-02 ENCOUNTER — Encounter: Payer: Self-pay | Admitting: Physician Assistant

## 2016-02-22 DIAGNOSIS — R5381 Other malaise: Secondary | ICD-10-CM | POA: Insufficient documentation

## 2017-05-07 IMAGING — DX DG SHOULDER 2+V*R*
1 series · 1 of 1 positions shown · non-contrast
Comparison: None.

CLINICAL DATA: Generalized right shoulder pain intermittently for
the past 2 months

EXAM:
RIGHT SHOULDER - 2+ VIEW

[shoulder axillary]
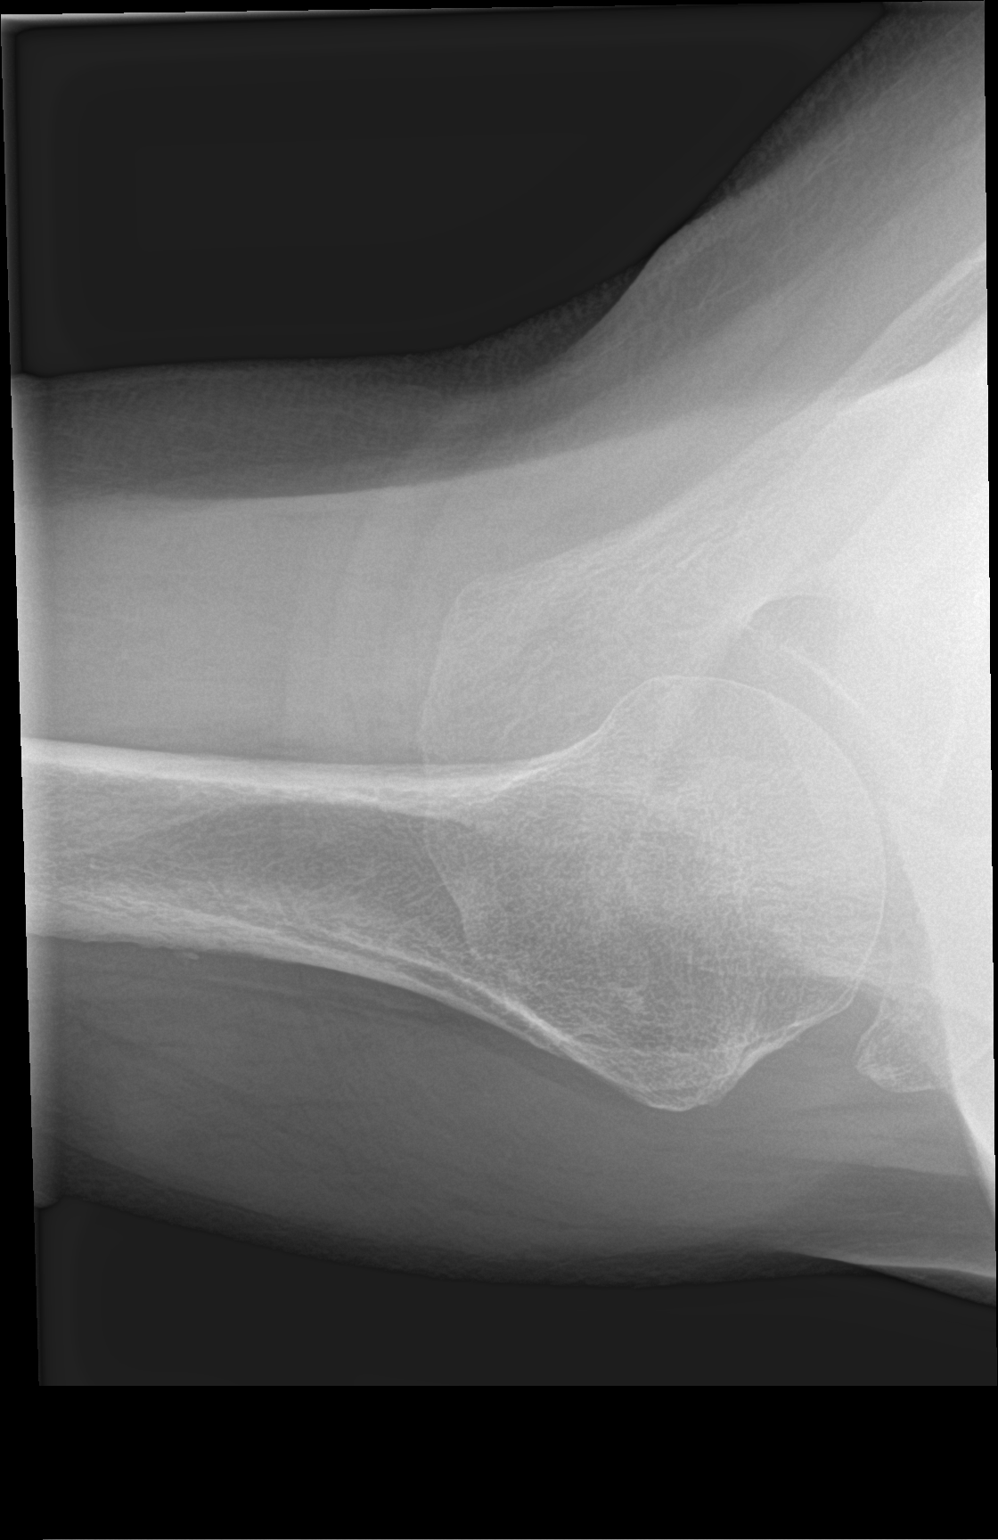

[1 of 1 positions shown; findings below may reference images not displayed]

FINDINGS: The bones of the shoulder are adequately mineralized for age. The
glenohumeral and AC joints appear reasonably well maintained. There
is no lytic or blastic lesion. There is no acute or healing
fracture. There is mild narrowing of the subacromial subdeltoid
space.
IMPRESSION: There is no acute or significant chronic bony abnormality of the
right shoulder. There may be an impingement type anatomy impacting
the insertion of the rotator cuff on the greater tuberosity.

## 2017-05-17 IMAGING — DX DG CHEST 2V
2 series · 2 of 2 positions shown · non-contrast
Comparison: None.

CLINICAL DATA: Shortness of breath.  Cough.

EXAM:
CHEST  2 VIEW

[chest pa]
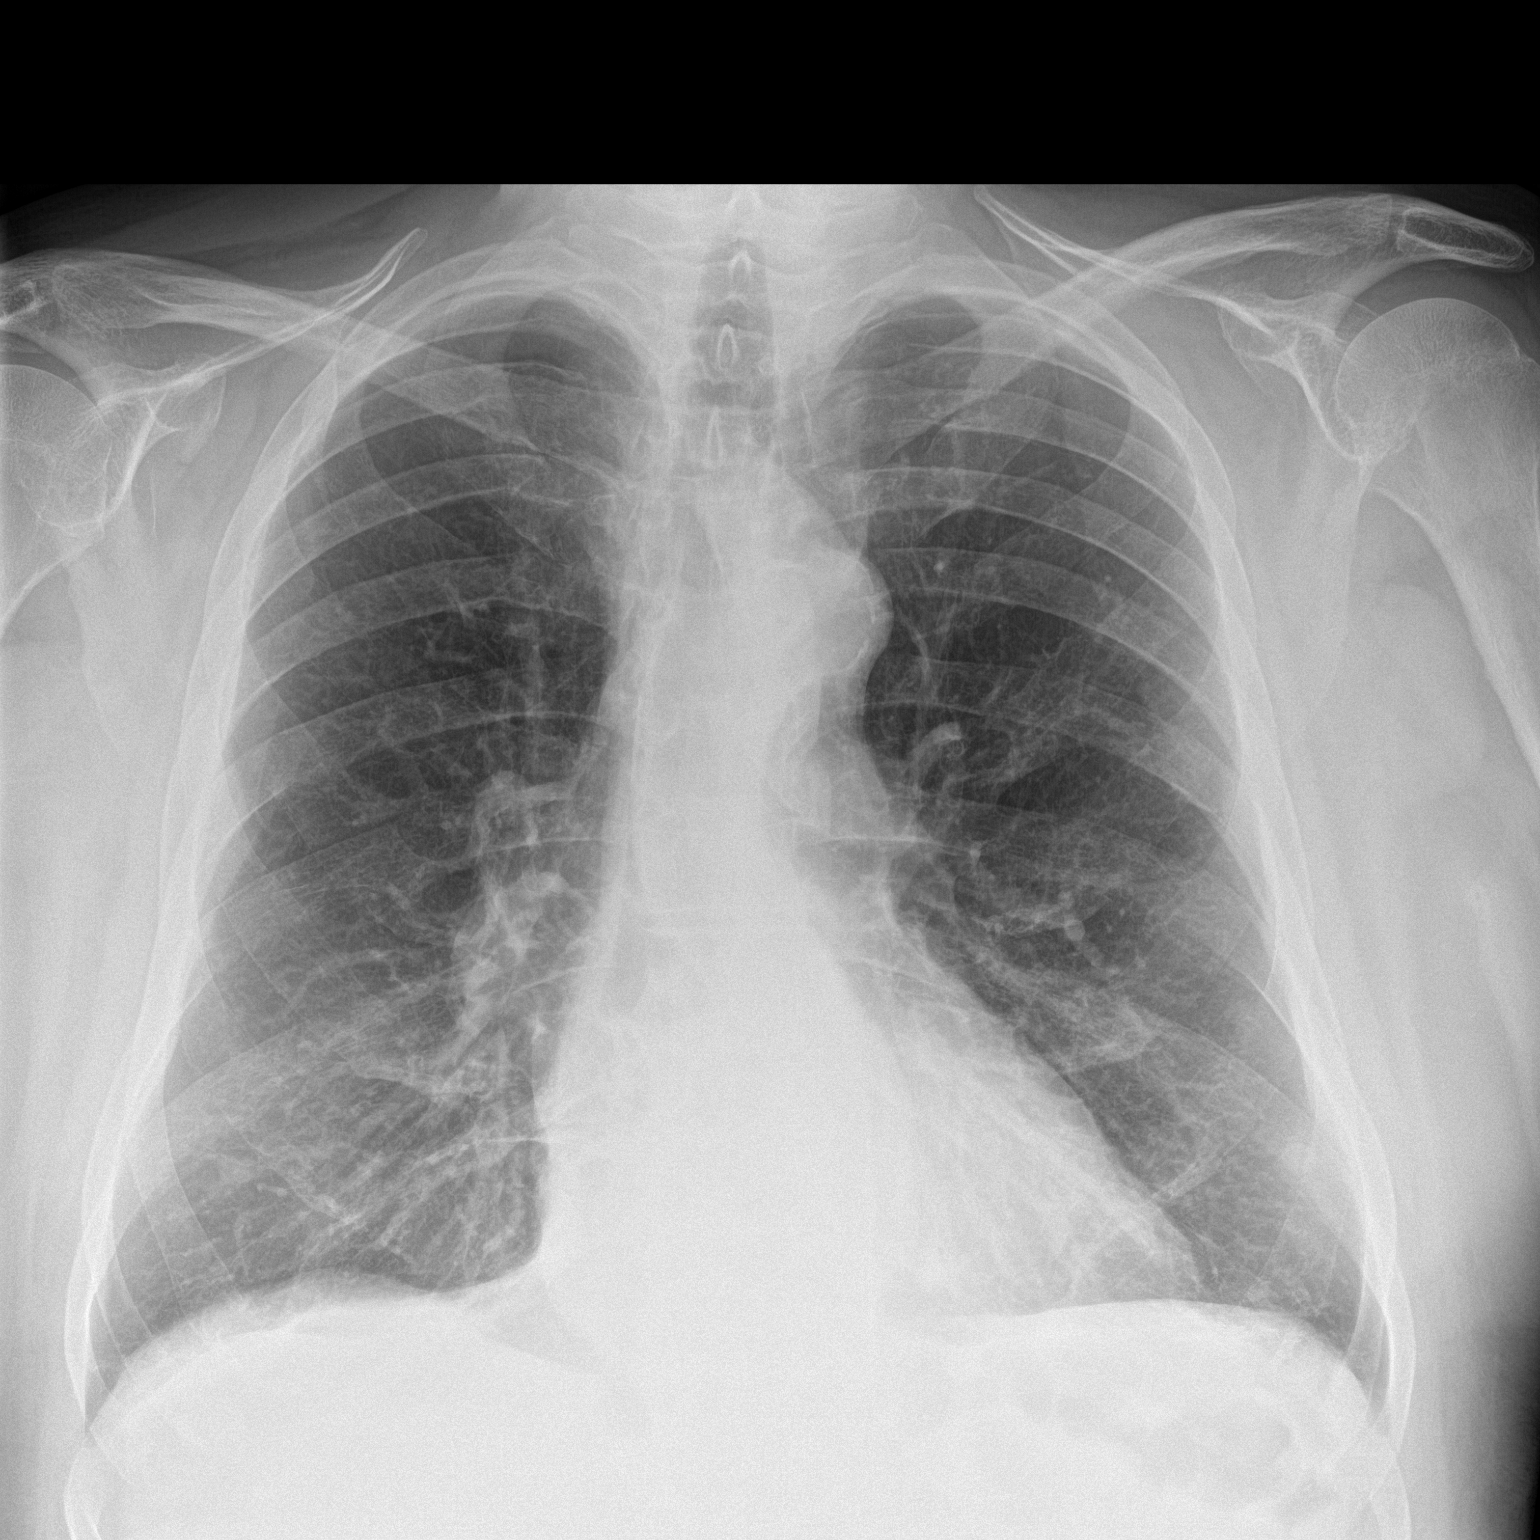

[chest lat]
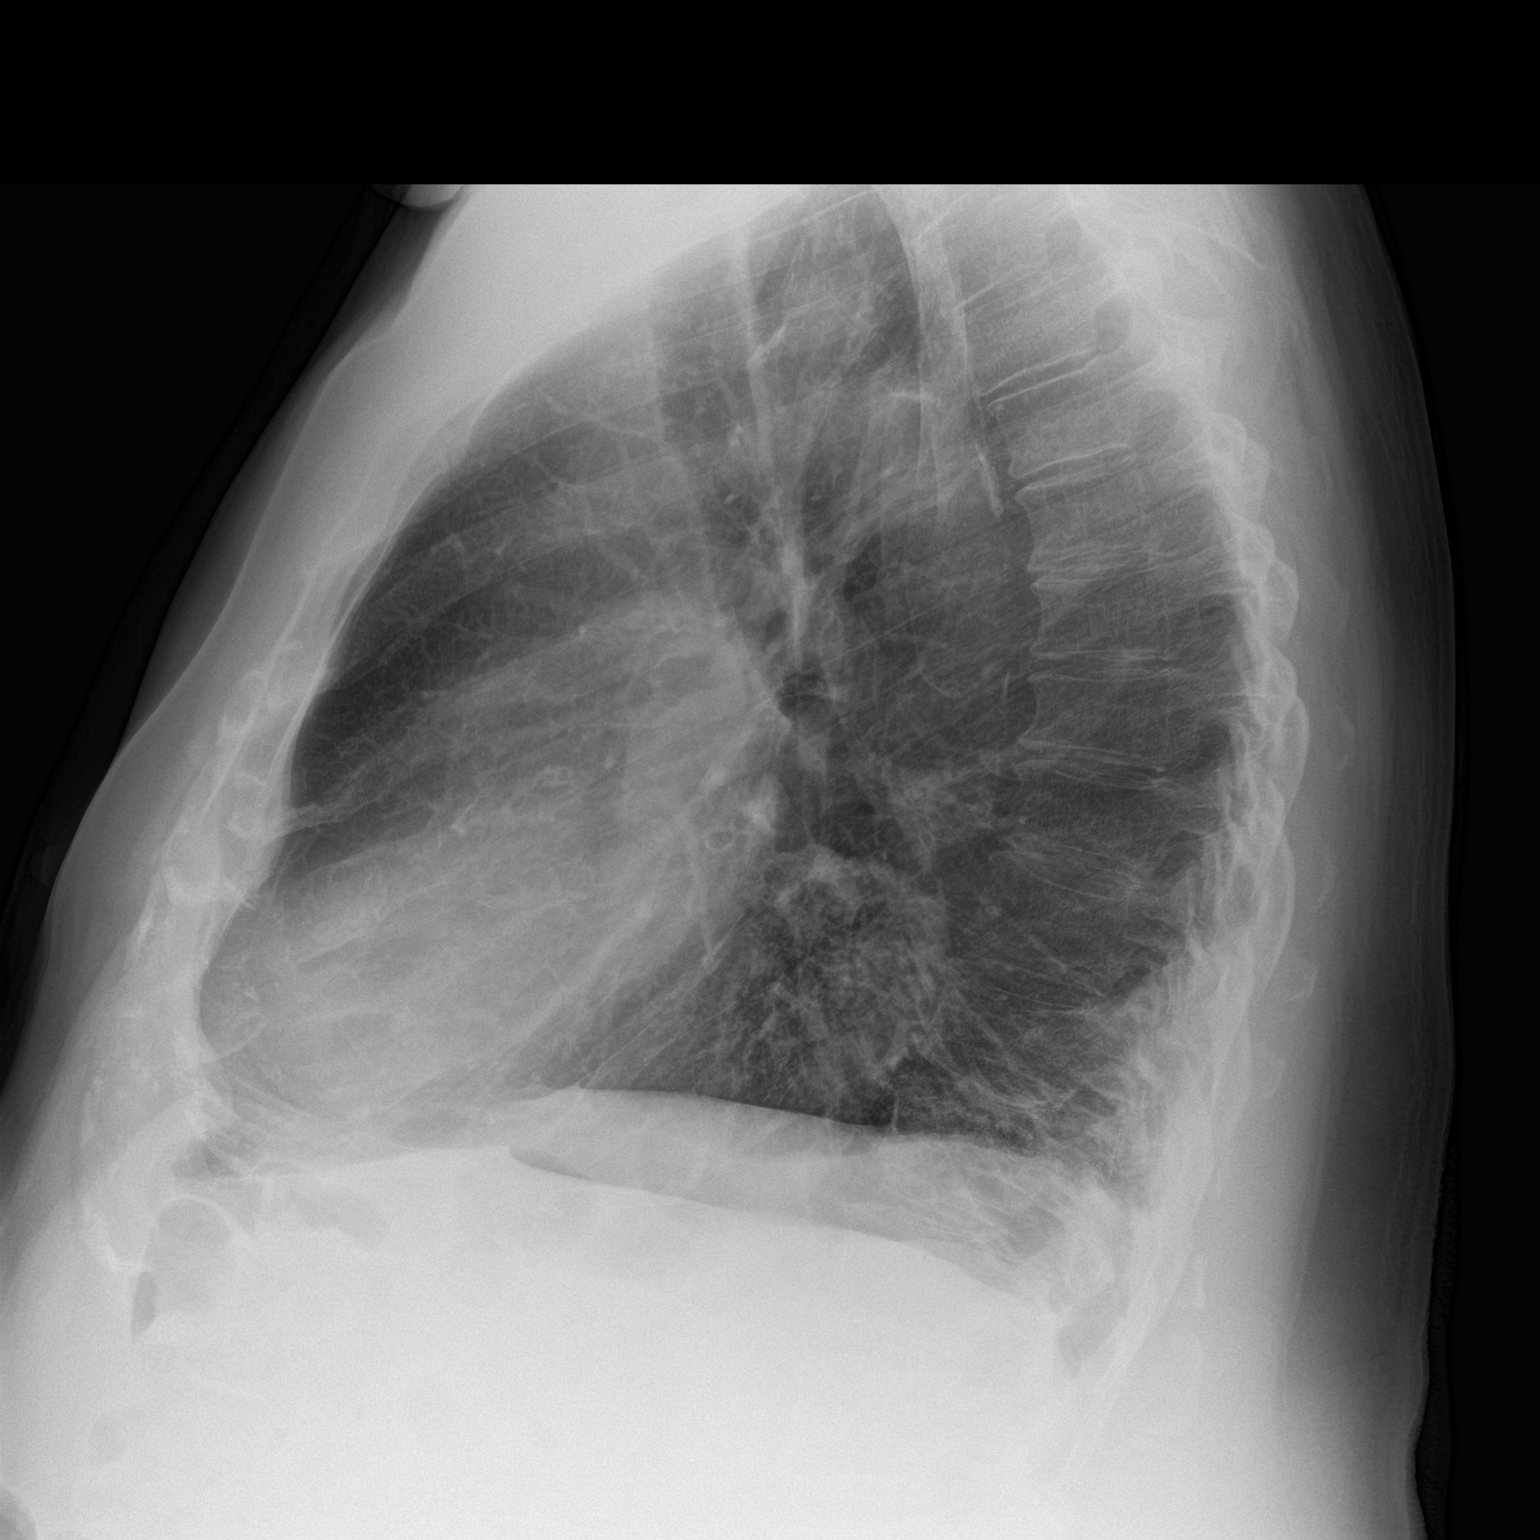

[2 of 2 positions shown; findings below may reference images not displayed]

FINDINGS: Mediastinum and hilar structures are normal. Heart size normal.
Symmetric single nodular opacities noted the lung bases, these are
most likely nipple shadows. Repeat chest x-ray with nipple markers
can be obtained. Basal pleural-parenchymal thickening most
consistent with scarring. Sliding hiatal hernia. No acute bony
abnormality.
IMPRESSION: 1. Symmetric single nodular opacities over both lung bases, most
likely nipple shadows. Repeat chest x-ray with nipple markers can be
obtained to further evaluate.
2. Bibasilar pleural parenchymal thickening most consistent
scarring.
3. Sliding hiatal hernia.

## 2017-06-20 IMAGING — DX DG CHEST 2V
2 series · 2 of 2 positions shown · non-contrast
Comparison: PA and lateral chest x-ray September 21, 2014

CLINICAL DATA: Increased shortness of breath and weakness for the
past week, history of COPD CHF and chronic renal insufficiency,
history of esophageal cancer and surgery for same.

EXAM:
CHEST  2 VIEW

[chest pa]
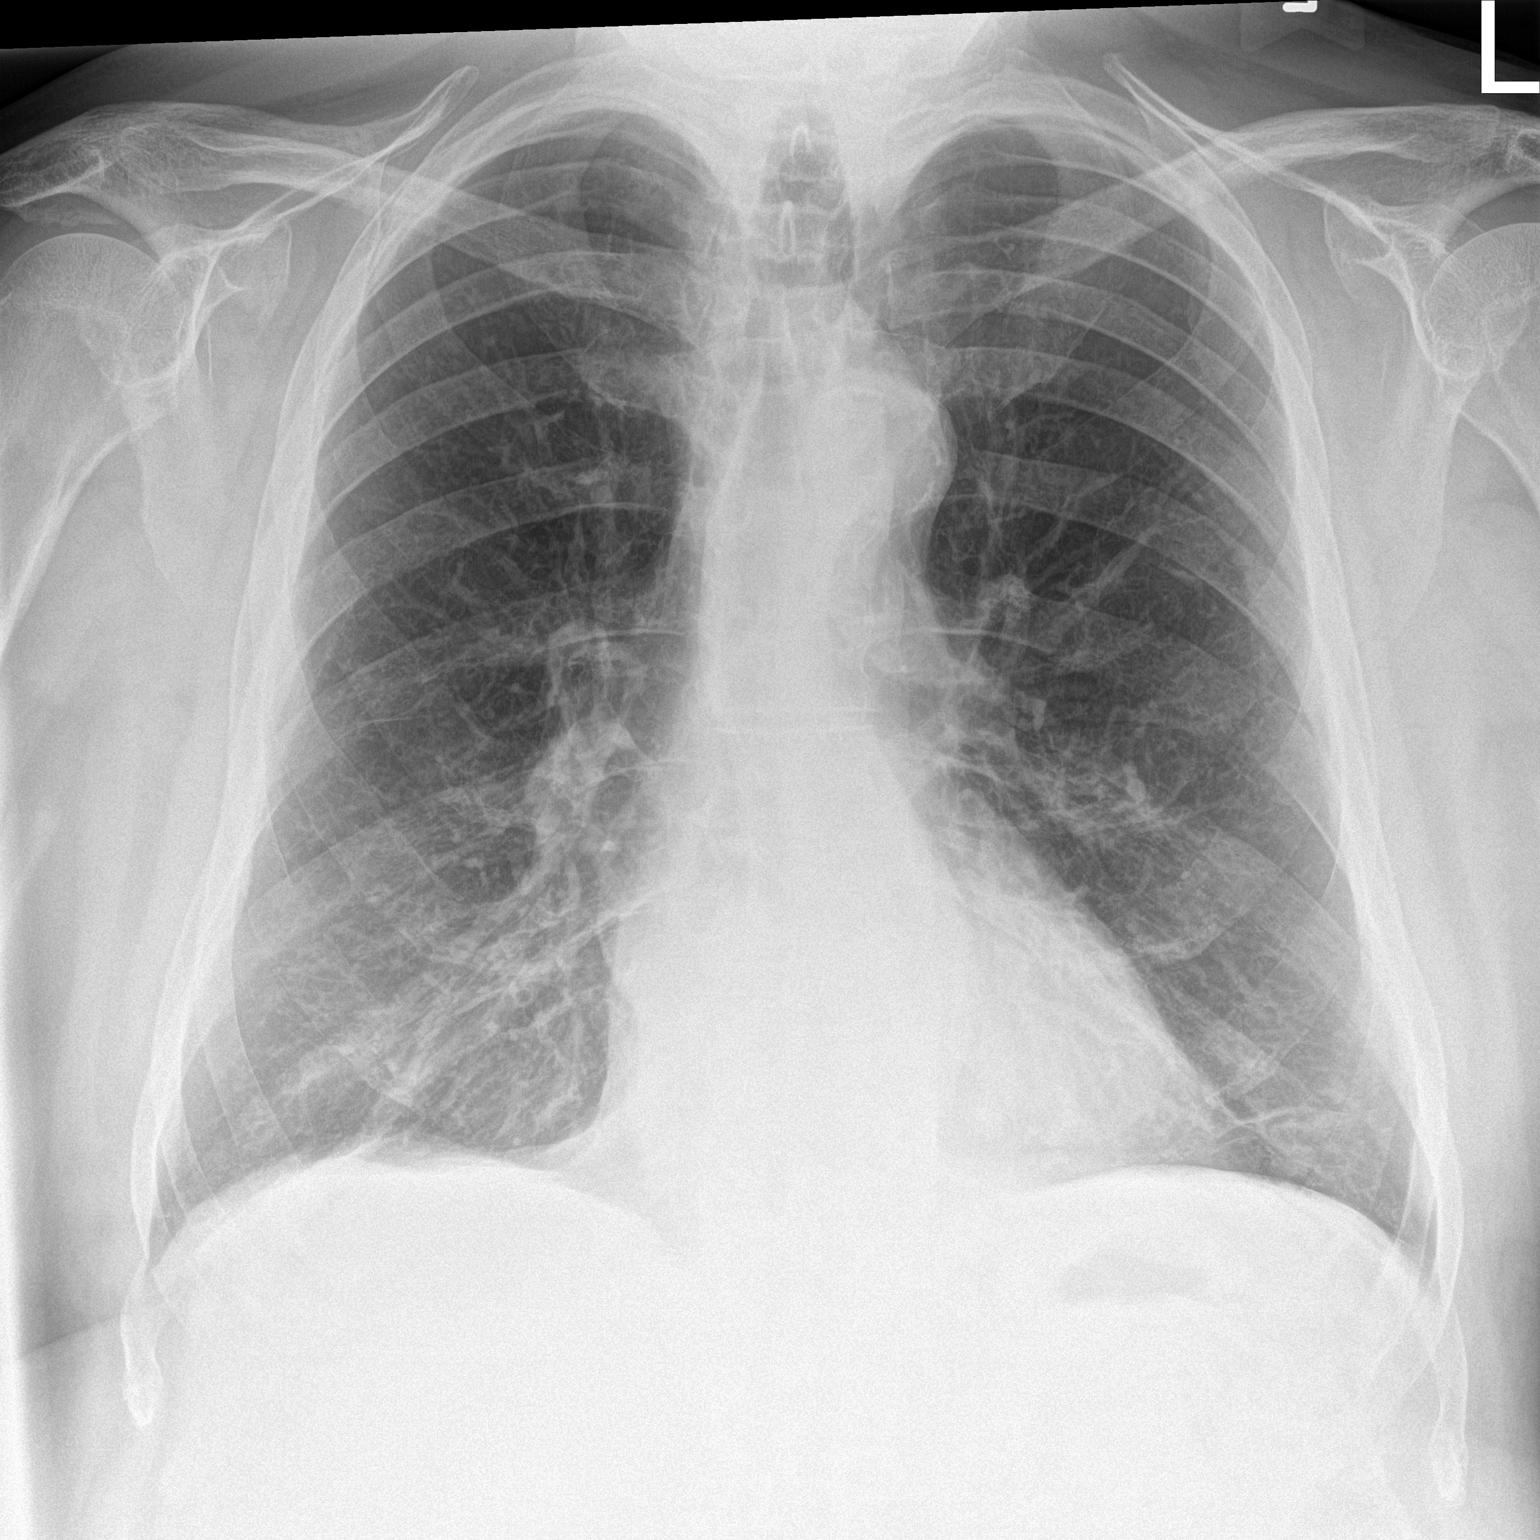

[chest lat]
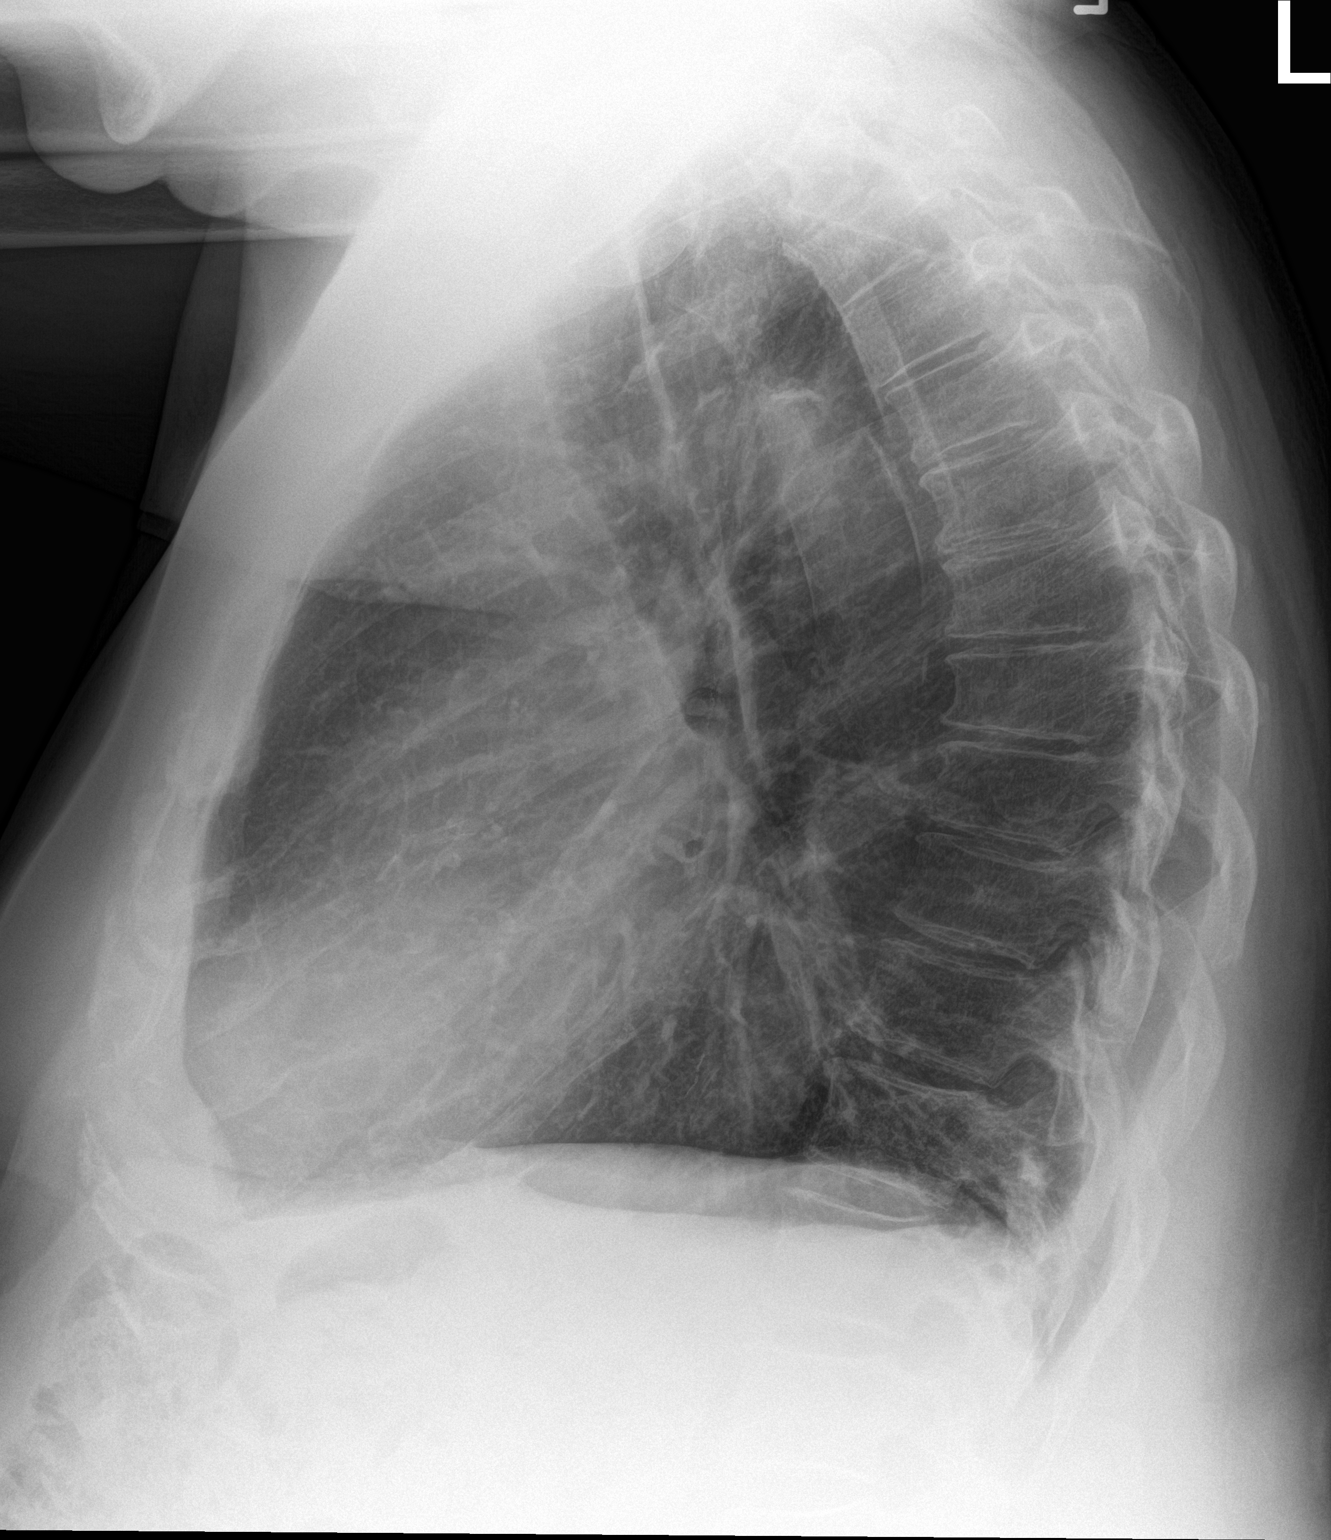

[2 of 2 positions shown; findings below may reference images not displayed]

FINDINGS: The lungs are hyperinflated with hemidiaphragm flattening. The lung
markings are coarse in the lower lobes but stable there are new
increased linear densities in the right infrahilar region that may
reflect fluid within bronchiectatic change or areas of subsegmental
atelectasis. There is no alveolar infiltrate or pleural effusion.
The heart and pulmonary vascularity are normal. There is tortuosity
of the descending thoracic aorta. The bony thorax exhibits no acute
abnormality.
IMPRESSION: COPD. There is no alveolar pneumonia. Subsegmental atelectasis or
bronchiectatic changes in the right lower lobe is suspected.

## 2017-11-18 ENCOUNTER — Encounter: Payer: Self-pay | Admitting: Internal Medicine
